# Patient Record
Sex: Female | Born: 1965 | Race: Black or African American | Hispanic: No | Marital: Married | Smoking: Never smoker
Health system: Southern US, Community
[De-identification: ages and names within clinical notes are randomized; demographics above are authoritative.]

## PROBLEM LIST (undated history)

## (undated) DIAGNOSIS — I10 Essential (primary) hypertension: Secondary | ICD-10-CM

## (undated) DIAGNOSIS — E119 Type 2 diabetes mellitus without complications: Secondary | ICD-10-CM

## (undated) DIAGNOSIS — K5900 Constipation, unspecified: Secondary | ICD-10-CM

## (undated) DIAGNOSIS — M7989 Other specified soft tissue disorders: Secondary | ICD-10-CM

## (undated) DIAGNOSIS — R0602 Shortness of breath: Secondary | ICD-10-CM

## (undated) DIAGNOSIS — I2510 Atherosclerotic heart disease of native coronary artery without angina pectoris: Secondary | ICD-10-CM

## (undated) DIAGNOSIS — I519 Heart disease, unspecified: Secondary | ICD-10-CM

## (undated) DIAGNOSIS — E669 Obesity, unspecified: Secondary | ICD-10-CM

## (undated) DIAGNOSIS — E785 Hyperlipidemia, unspecified: Secondary | ICD-10-CM

## (undated) DIAGNOSIS — I252 Old myocardial infarction: Secondary | ICD-10-CM

## (undated) DIAGNOSIS — G479 Sleep disorder, unspecified: Secondary | ICD-10-CM

## (undated) DIAGNOSIS — F419 Anxiety disorder, unspecified: Secondary | ICD-10-CM

## (undated) DIAGNOSIS — E559 Vitamin D deficiency, unspecified: Secondary | ICD-10-CM

## (undated) HISTORY — DX: Shortness of breath: R06.02

## (undated) HISTORY — PX: CARDIAC CATHETERIZATION: SHX172

## (undated) HISTORY — DX: Other specified soft tissue disorders: M79.89

## (undated) HISTORY — DX: Anxiety disorder, unspecified: F41.9

## (undated) HISTORY — DX: Hyperlipidemia, unspecified: E78.5

## (undated) HISTORY — DX: Vitamin D deficiency, unspecified: E55.9

## (undated) HISTORY — DX: Old myocardial infarction: I25.2

## (undated) HISTORY — DX: Obesity, unspecified: E66.9

## (undated) HISTORY — DX: Heart disease, unspecified: I51.9

## (undated) HISTORY — DX: Constipation, unspecified: K59.00

## (undated) HISTORY — DX: Sleep disorder, unspecified: G47.9

## (undated) HISTORY — DX: Essential (primary) hypertension: I10

## (undated) HISTORY — DX: Type 2 diabetes mellitus without complications: E11.9

---

## 1998-10-18 ENCOUNTER — Ambulatory Visit (HOSPITAL_COMMUNITY): Admission: RE | Admit: 1998-10-18 | Discharge: 1998-10-18 | Payer: Self-pay | Admitting: Obstetrics and Gynecology

## 1998-11-07 ENCOUNTER — Inpatient Hospital Stay (HOSPITAL_COMMUNITY): Admission: AD | Admit: 1998-11-07 | Discharge: 1998-11-07 | Payer: Self-pay | Admitting: Obstetrics and Gynecology

## 1998-11-19 ENCOUNTER — Inpatient Hospital Stay (HOSPITAL_COMMUNITY): Admission: AD | Admit: 1998-11-19 | Discharge: 1998-11-19 | Payer: Self-pay | Admitting: Obstetrics and Gynecology

## 1998-11-20 ENCOUNTER — Encounter: Payer: Self-pay | Admitting: Obstetrics and Gynecology

## 1998-11-20 ENCOUNTER — Inpatient Hospital Stay (HOSPITAL_COMMUNITY): Admission: AD | Admit: 1998-11-20 | Discharge: 1998-11-20 | Payer: Self-pay | Admitting: Obstetrics and Gynecology

## 1998-12-13 ENCOUNTER — Inpatient Hospital Stay (HOSPITAL_COMMUNITY): Admission: AD | Admit: 1998-12-13 | Discharge: 1998-12-17 | Payer: Self-pay | Admitting: Obstetrics and Gynecology

## 1999-02-05 ENCOUNTER — Other Ambulatory Visit: Admission: RE | Admit: 1999-02-05 | Discharge: 1999-02-05 | Payer: Self-pay | Admitting: *Deleted

## 2000-10-07 ENCOUNTER — Other Ambulatory Visit: Admission: RE | Admit: 2000-10-07 | Discharge: 2000-10-07 | Payer: Self-pay | Admitting: Gynecology

## 2001-11-03 ENCOUNTER — Encounter: Payer: Self-pay | Admitting: Internal Medicine

## 2001-11-03 ENCOUNTER — Encounter: Admission: RE | Admit: 2001-11-03 | Discharge: 2001-11-03 | Payer: Self-pay | Admitting: Internal Medicine

## 2002-08-10 ENCOUNTER — Other Ambulatory Visit: Admission: RE | Admit: 2002-08-10 | Discharge: 2002-08-10 | Payer: Self-pay | Admitting: Gynecology

## 2003-01-05 ENCOUNTER — Encounter: Payer: Self-pay | Admitting: Internal Medicine

## 2003-01-05 ENCOUNTER — Encounter: Admission: RE | Admit: 2003-01-05 | Discharge: 2003-01-05 | Payer: Self-pay | Admitting: Internal Medicine

## 2003-10-20 HISTORY — PX: PARTIAL HYSTERECTOMY: SHX80

## 2004-04-02 ENCOUNTER — Other Ambulatory Visit: Admission: RE | Admit: 2004-04-02 | Discharge: 2004-04-02 | Payer: Self-pay | Admitting: Gynecology

## 2004-05-14 ENCOUNTER — Encounter (INDEPENDENT_AMBULATORY_CARE_PROVIDER_SITE_OTHER): Payer: Self-pay | Admitting: Specialist

## 2004-05-15 ENCOUNTER — Inpatient Hospital Stay (HOSPITAL_COMMUNITY): Admission: RE | Admit: 2004-05-15 | Discharge: 2004-05-16 | Payer: Self-pay | Admitting: Gynecology

## 2006-10-29 ENCOUNTER — Other Ambulatory Visit: Admission: RE | Admit: 2006-10-29 | Discharge: 2006-10-29 | Payer: Self-pay | Admitting: Internal Medicine

## 2006-11-19 ENCOUNTER — Encounter: Admission: RE | Admit: 2006-11-19 | Discharge: 2006-11-19 | Payer: Self-pay | Admitting: Internal Medicine

## 2010-02-15 ENCOUNTER — Encounter: Admission: RE | Admit: 2010-02-15 | Discharge: 2010-02-15 | Payer: Self-pay | Admitting: Internal Medicine

## 2010-11-09 ENCOUNTER — Encounter: Payer: Self-pay | Admitting: Internal Medicine

## 2011-01-22 ENCOUNTER — Other Ambulatory Visit: Payer: Self-pay | Admitting: Gynecology

## 2011-02-23 ENCOUNTER — Other Ambulatory Visit: Payer: Self-pay | Admitting: Internal Medicine

## 2011-02-23 DIAGNOSIS — R209 Unspecified disturbances of skin sensation: Secondary | ICD-10-CM

## 2011-02-25 ENCOUNTER — Ambulatory Visit
Admission: RE | Admit: 2011-02-25 | Discharge: 2011-02-25 | Disposition: A | Discharge status: Home / Self Care | Payer: Managed Care, Other (non HMO) | Source: Ambulatory Visit | Attending: Internal Medicine | Admitting: Internal Medicine

## 2011-02-25 DIAGNOSIS — R209 Unspecified disturbances of skin sensation: Secondary | ICD-10-CM

## 2011-02-25 MED ORDER — GADOBENATE DIMEGLUMINE 529 MG/ML IV SOLN
17.0000 mL | Freq: Once | INTRAVENOUS | Status: AC | PRN
  Administered 2011-02-25: 17 mL via INTRAVENOUS

## 2011-03-06 NOTE — Op Note (Signed)
NAME:  [name] [name], [name] [name]                         ACCOUNT NO.:  [id number]   MEDICAL RECORD NO.:  [id number]                   PATIENT TYPE:  AMB   LOCATION:  DAY                                  FACILITY:  [facility]   PHYSICIAN:  Gretta Cool, M.D.              DATE OF BIRTH:  07-22-1966   DATE OF PROCEDURE:  05/14/2004  DATE OF DISCHARGE:                                 OPERATIVE REPORT   PREOPERATIVE DIAGNOSES:  Abnormal uterine bleeding, dysmenorrhea and anemia  secondary to uterine fibroids recurrent post myomectomy.   POSTOPERATIVE DIAGNOSES:  Abnormal uterine bleeding, dysmenorrhea and anemia  secondary to uterine fibroids recurrent post myomectomy.   PROCEDURE:  Supracervical hysterectomy, lysis of adhesions.   SURGEON:  Gretta Cool, M.D.   ASSISTANT:  Raynald Kemp, M.D.   ANESTHESIA:  General oral tracheal.   DESCRIPTION OF PROCEDURE:  Under excellent general anesthesia with the  patient prepped and draped in supine position with the Foley catheter  draining her bladder, a Pfannenstiel incision was made and extended through  the fascia. The peritoneum was then opened and the abdomen explored. There  were no abnormalities identified in the upper abdomen. There was adhesion  particularly of the anterior uterus to the anterior pelvic wall and  extensive adhesions about the bladder flap.  At this point, these adhesions  were lysed, some omental adhesions to the anterior abdominal wall were also  lysed and the uterus mobilized. The round ligaments were then transected,  anterior leaf of the broad ligament was opened and pushed off the lower  segment.  The ovarian ligaments were then clamped, cut, sutured and doubly  ligated with #0 Vicryl.  The uterine vessels then skeletonized, clamped,  cut, sutured and tied with #0 Vicryl. At this point, the cervix was incised  in __________ fashion so as to remove as much as possible of the  endocervical canal. The remainder of the  canal was treated by cautery.  At  this point, the cervix was closed with a running mattress suture of #0  Vicryl. At this point, all the pedicles were dry, pelvis was irrigated with  lactated Ringer's. The pelvic peritoneum was then closed with running suture  of 2-0 Monocryl.  At this point, the pelvis was once again irrigated, the  packs and retractors removed, the abdominoperitoneum was closed with running  suture of #0 Monocryl. The rectus muscles were plicated in the midline with  running suture of #0 Monocryl, the fascia was approximated with running  suture of #0 Vicryl from each angle to midline. The subcutaneous tissue was  approximated with interrupted sutures of 3-0 Vicryl, skin was closed with  skin staples and Steri-Strips.  At the end of the procedure, sponge and lap  counts were correct with no complications. The patient returned to the  recovery room in excellent condition.  Gretta Cool, M.D.   CWL/MEDQ  D:  05/14/2004  T:  05/14/2004  Job:  [id number]   cc:   Dr. Theda Sers

## 2011-03-06 NOTE — H&P (Signed)
NAME:  [name] [name], [name] [name]                         ACCOUNT NO.:  [id number]   MEDICAL RECORD NO.:  [id number]                   PATIENT TYPE:  AMB   LOCATION:  DAY                                  FACILITY:  [facility]   PHYSICIAN:  Gretta Cool, M.D.              DATE OF BIRTH:  11-Oct-1966   DATE OF ADMISSION:  DATE OF DISCHARGE:                                HISTORY & PHYSICAL   CHIEF COMPLAINT:  Abnormal uterine bleeding, dysmenorrhea and increased in  abdominal girth.   HISTORY OF PRESENT ILLNESS:  A 45 year old gravida 1, para 1, with a history  of a myomectomy in 1996.  She had fibroids that together weighed 1575 grams,  largest measuring 15 cm.  She subsequently had a successful pregnancy and  was delivered by a cesarean section delivery.  She now has a recurrence of  uterine leiomyomata and abnormal uterine bleeding and she is admitted for  definitive therapy by supracervical hysterectomy.  She understands the  risk/benefit ratio, all alternative therapies.  She has used no  contraception and has failed to conceive.  She is on continuous iron therapy  because of frequent and heavy and prolonged cycles.   She also has hypertension on multiple medications.  Primary care doctor, Dr.  Theda Sers.   PAST MEDICAL HISTORY:  Usual childhood disease without sequelae.  Medical  illnesses:  Hypertension on therapy as above.  Denies other significant  medical history.  Previous surgery:  Myomectomy 1996, cesarean section  delivery 2000.   FAMILY HISTORY:  Maternal grandmother has hypercholesterolemia, history of  stroke.   SOCIAL HISTORY:  The patient works for [employer].  Husband is  employed at [employer].  They have one child.   REVIEW OF SYSTEMS:  HEENT:  Denies symptoms.  CARDIORESPIRATORY:  Denies  asthma, cough, bronchitis, shortness of breath.  GI/GU:  Denies frequency,  urgency, dysuria, change in bowel habits, food intolerance.   PHYSICAL EXAMINATION:  GENERAL:   Well-developed, well-nourished, short  __________ female, massively over ideal weight.  VITAL SIGNS:  Blood pressure 138/98, pulse 80, respirations 20.  HEENT:  Pupils equally react to light and accommodate.  Fundi not examined.  Oropharynx clear.  CHEST:  Clear to percussion and auscultation.  HEART:  Regular rhythm without murmur, cardiac enlargement.  BREASTS:  ABDOMEN:  Soft with a large panniculus.  No significant organomegaly is  palpable.  RECTAL/PELVIC:  External genitalia normal female, vagina clean, rugous  cervix is nulliparous clean.  Uterus is irregular and enlarged, difficult to  outline because of her abdominal panniculus.  Adnexa nonpalpable.  Rectovaginal confirms.   IMPRESSION:  1. Uterine leiomyomata recurrent with abnormal uterine bleeding.  2. History of myomectomy 1996 with delivery of fibroids weighing greater     than 1500 grams.  3. History of cesarean section delivery   PLAN:  On to supracervical hysterectomy, ovarian conservation if possible.  Risk/benefit ratio discussed  in detail.  Alternative therapies __________.                                               Gretta Cool, M.D.    CWL/MEDQ  D:  05/13/2004  T:  05/13/2004  Job:  [id number]   cc:   Theda Sers, MD  [facility]

## 2014-01-09 ENCOUNTER — Encounter: Payer: 59 | Attending: Internal Medicine

## 2014-01-09 VITALS — Ht 60.0 in | Wt 188.4 lb

## 2014-01-09 DIAGNOSIS — Z713 Dietary counseling and surveillance: Secondary | ICD-10-CM | POA: Insufficient documentation

## 2014-01-09 DIAGNOSIS — E119 Type 2 diabetes mellitus without complications: Secondary | ICD-10-CM | POA: Insufficient documentation

## 2014-01-10 NOTE — Progress Notes (Signed)
Patient was seen on 01/09/14 for the first of a series of three diabetes self-management courses at the [facility].  Current HbA1c: 8.9%  The following learning objectives were met by the patient during this class:  Describe diabetes  State some common risk factors for diabetes  Defines the role of glucose and insulin  Identifies type of diabetes and pathophysiology  Describe the relationship between diabetes and cardiovascular risk  State the members of the Healthcare Team  States the rationale for glucose monitoring  State when to test glucose  State their individual Target Range  State the importance of logging glucose readings  Describe how to interpret glucose readings  Identifies A1C target  Explain the correlation between A1c and eAG values  State symptoms and treatment of high blood glucose  State symptoms and treatment of low blood glucose  Explain proper technique for glucose testing  Identifies proper sharps disposal  Handouts given during class include:  Living Well with Diabetes book  Carb Counting and Meal Planning book  Meal Plan Card  Carbohydrate guide  Meal planning worksheet  Low Sodium Flavoring Tips  The diabetes portion plate  C6C to eAG Conversion Chart  Diabetes Medications  Diabetes Recommended Care Schedule  Support Group  Diabetes Success Plan  Core Class Satisfaction Survey  Follow-Up Plan:  Attend core 2

## 2014-01-16 DIAGNOSIS — E119 Type 2 diabetes mellitus without complications: Secondary | ICD-10-CM

## 2014-01-17 NOTE — Progress Notes (Signed)

## 2014-01-23 ENCOUNTER — Encounter: Payer: 59 | Attending: Internal Medicine

## 2014-01-23 DIAGNOSIS — Z713 Dietary counseling and surveillance: Secondary | ICD-10-CM | POA: Insufficient documentation

## 2014-01-23 DIAGNOSIS — E119 Type 2 diabetes mellitus without complications: Secondary | ICD-10-CM

## 2014-01-23 NOTE — Progress Notes (Signed)
Patient was seen on 01/23/14 for the third of a series of three diabetes self-management courses at the [facility]. The following learning objectives were met by the patient during this class:    State the amount of activity recommended for healthy living   Describe activities suitable for individual needs   Identify ways to regularly incorporate activity into daily life   Identify barriers to activity and ways to over come these barriers  Identify diabetes medications being personally used and their primary action for lowering glucose and possible side effects   Describe role of stress on blood glucose and develop strategies to address psychosocial issues   Identify diabetes complications and ways to prevent them  Explain how to manage diabetes during illness   Evaluate success in meeting personal goal   Establish 2-3 goals that they will plan to diligently work on until they return for the  56-monthfollow-up visit  Goals:  Follow Diabetes Meal Plan as instructed  Aim for 15-30 mins of physical activity daily as tolerated  Bring food record and glucose log to your follow up visit  Your patient has established the following 4 month goals in their individualized success plan: I will count my carb choices at most meals and snacks I will increase my activity level at least 3 days a week I will take my diabetes medications as scheduled  Your patient has identified these potential barriers to change:  None stated  Your patient has identified their diabetes self-care support plan as  [facility]  Join the gym  Enroll in a dance class  Plan:  Attend Core 4 in 4 months

## 2014-05-21 ENCOUNTER — Ambulatory Visit: Payer: Managed Care, Other (non HMO)

## 2016-01-29 ENCOUNTER — Encounter (HOSPITAL_COMMUNITY): Payer: Self-pay | Admitting: Emergency Medicine

## 2016-01-29 ENCOUNTER — Ambulatory Visit (INDEPENDENT_AMBULATORY_CARE_PROVIDER_SITE_OTHER)
Admission: EM | Admit: 2016-01-29 | Discharge: 2016-01-29 | Disposition: A | Discharge status: Nursing Home (Includes ICF) | Payer: 59 | Source: Home / Self Care | Attending: Family Medicine | Admitting: Family Medicine

## 2016-01-29 ENCOUNTER — Encounter (HOSPITAL_COMMUNITY): Payer: Self-pay | Admitting: *Deleted

## 2016-01-29 ENCOUNTER — Emergency Department (HOSPITAL_COMMUNITY)
Admission: EM | Admit: 2016-01-29 | Discharge: 2016-01-30 | Disposition: A | Discharge status: Home / Self Care | Payer: 59 | Attending: Emergency Medicine | Admitting: Emergency Medicine

## 2016-01-29 DIAGNOSIS — E669 Obesity, unspecified: Secondary | ICD-10-CM | POA: Insufficient documentation

## 2016-01-29 DIAGNOSIS — Z79899 Other long term (current) drug therapy: Secondary | ICD-10-CM | POA: Diagnosis not present

## 2016-01-29 DIAGNOSIS — E1165 Type 2 diabetes mellitus with hyperglycemia: Secondary | ICD-10-CM

## 2016-01-29 DIAGNOSIS — R350 Frequency of micturition: Secondary | ICD-10-CM

## 2016-01-29 DIAGNOSIS — R109 Unspecified abdominal pain: Secondary | ICD-10-CM | POA: Diagnosis not present

## 2016-01-29 DIAGNOSIS — R358 Other polyuria: Secondary | ICD-10-CM | POA: Diagnosis not present

## 2016-01-29 DIAGNOSIS — Z3202 Encounter for pregnancy test, result negative: Secondary | ICD-10-CM | POA: Diagnosis not present

## 2016-01-29 DIAGNOSIS — I10 Essential (primary) hypertension: Secondary | ICD-10-CM | POA: Diagnosis not present

## 2016-01-29 DIAGNOSIS — Z7984 Long term (current) use of oral hypoglycemic drugs: Secondary | ICD-10-CM | POA: Diagnosis not present

## 2016-01-29 DIAGNOSIS — Z880 Allergy status to penicillin: Secondary | ICD-10-CM | POA: Diagnosis not present

## 2016-01-29 DIAGNOSIS — R739 Hyperglycemia, unspecified: Secondary | ICD-10-CM

## 2016-01-29 DIAGNOSIS — E785 Hyperlipidemia, unspecified: Secondary | ICD-10-CM | POA: Diagnosis not present

## 2016-01-29 DIAGNOSIS — R3589 Other polyuria: Secondary | ICD-10-CM

## 2016-01-29 LAB — BASIC METABOLIC PANEL
Anion gap: 17 — ABNORMAL HIGH (ref 5–15)
BUN: 16 mg/dL (ref 6–20)
CALCIUM: 11 mg/dL — AB (ref 8.9–10.3)
CO2: 23 mmol/L (ref 22–32)
CREATININE: 0.9 mg/dL (ref 0.44–1.00)
Chloride: 95 mmol/L — ABNORMAL LOW (ref 101–111)
GFR calc non Af Amer: >60 mL/min (ref >60)
GLUCOSE: 556 mg/dL — AB (ref 65–99)
Potassium: 4.1 mmol/L (ref 3.5–5.1)
Sodium: 135 mmol/L (ref 135–145)

## 2016-01-29 LAB — CBC
HCT: 44.6 % (ref 36.0–46.0)
Hemoglobin: 14.6 g/dL (ref 12.0–15.0)
MCH: 25.5 pg — AB (ref 26.0–34.0)
MCHC: 32.7 g/dL (ref 30.0–36.0)
MCV: 78 fL (ref 78.0–100.0)
PLATELETS: 290 10*3/uL (ref 150–400)
RBC: 5.72 MIL/uL — ABNORMAL HIGH (ref 3.87–5.11)
RDW: 13.5 % (ref 11.5–15.5)
WBC: 9.4 10*3/uL (ref 4.0–10.5)

## 2016-01-29 LAB — POCT URINALYSIS DIP (DEVICE)
BILIRUBIN URINE: NEGATIVE
Glucose, UA: >=1000 mg/dL — AB
Hgb urine dipstick: NEGATIVE
Ketones, ur: 15 mg/dL — AB
Leukocytes, UA: NEGATIVE
NITRITE: NEGATIVE
PH: 5.5 (ref 5.0–8.0)
Protein, ur: NEGATIVE mg/dL
Specific Gravity, Urine: <=1.005 (ref 1.005–1.030)
Urobilinogen, UA: 0.2 mg/dL (ref 0.0–1.0)

## 2016-01-29 LAB — URINALYSIS, ROUTINE W REFLEX MICROSCOPIC
BILIRUBIN URINE: NEGATIVE
Glucose, UA: >1000 mg/dL — AB
HGB URINE DIPSTICK: NEGATIVE
Ketones, ur: 15 mg/dL — AB
Leukocytes, UA: NEGATIVE
Nitrite: NEGATIVE
PROTEIN: NEGATIVE mg/dL
Specific Gravity, Urine: 1.035 — ABNORMAL HIGH (ref 1.005–1.030)
pH: 5 (ref 5.0–8.0)

## 2016-01-29 LAB — URINE MICROSCOPIC-ADD ON
RBC / HPF: NONE SEEN RBC/hpf (ref 0–5)
WBC, UA: NONE SEEN WBC/hpf (ref 0–5)

## 2016-01-29 LAB — CBG MONITORING, ED
GLUCOSE-CAPILLARY: 514 mg/dL — AB (ref 65–99)
Glucose-Capillary: 504 mg/dL — ABNORMAL HIGH (ref 65–99)

## 2016-01-29 LAB — GLUCOSE, CAPILLARY: GLUCOSE-CAPILLARY: 524 mg/dL — AB (ref 65–99)

## 2016-01-29 LAB — POC URINE PREG, ED: PREG TEST UR: NEGATIVE

## 2016-01-29 MED ORDER — SODIUM CHLORIDE 0.9 % IV BOLUS (SEPSIS)
1000.0000 mL | Freq: Once | INTRAVENOUS | Status: AC
  Administered 2016-01-29: 1000 mL via INTRAVENOUS

## 2016-01-29 NOTE — ED Provider Notes (Signed)
CSN: [id number]     Arrival date & time 01/29/16  1833 History   First MD Initiated Contact with Patient 01/29/16 2010     Chief Complaint  Patient presents with  . Urinary Tract Infection   (Consider location/radiation/quality/duration/timing/severity/associated sxs/prior Treatment) HPI Comments: 50 year old female with type 2 diabetes mellitus treated with metformin only presents with what she believes to be a tract infection. She is having urinary frequency, dysuria and urgency. Denies fever, GI symptoms. She states that she had run out of strips and has not been checking her blood sugar. She has not been following up with her PCP for diabetes management.    Past Medical History  Diagnosis Date  . Diabetes mellitus without complication (HCC)   . Hyperlipidemia   . Hypertension   . Obesity    Past Surgical History  Procedure Laterality Date  . Cesarean section     Family History  Problem Relation Age of Onset  . Hypertension Other    Social History  Substance Use Topics  . Smoking status: Never Smoker   . Smokeless tobacco: None  . Alcohol Use: Yes   OB History    No data available     Review of Systems  Constitutional: Negative for fever, chills and activity change.  HENT: Negative.   Respiratory: Negative.   Cardiovascular: Negative.   Endocrine: Positive for polydipsia and polyuria.  Genitourinary: Positive for dysuria, urgency and frequency. Negative for hematuria and pelvic pain.    Allergies  Penicillins  Home Medications   Prior to Admission medications   Medication Sig Start Date End Date Taking? Authorizing Provider  amLODipine (NORVASC) 10 MG tablet Take 10 mg by mouth daily.   Yes Historical Provider, MD  aspirin 81 MG tablet Take 81 mg by mouth daily.   Yes Historical Provider, MD  metFORMIN (GLUCOPHAGE) 500 MG tablet Take 1,000 mg by mouth 2 (two) times daily with a meal.   Yes Historical Provider, MD  simvastatin (ZOCOR) 20 MG tablet Take 20 mg  by mouth at bedtime.   Yes Historical Provider, MD  valsartan (DIOVAN) 160 MG tablet Take 160 mg by mouth daily.   Yes Historical Provider, MD   Meds Ordered and Administered this Visit  Medications - No data to display  BP 130/88 mmHg  Pulse 110  Temp(Src) 98 F (36.7 C) (Oral)  Resp 16  SpO2 96% No data found.   Physical Exam  Constitutional: She is oriented to person, place, and time. She appears well-developed and well-nourished. No distress.  Neck: Normal range of motion. Neck supple.  Cardiovascular: Regular rhythm.   Pulmonary/Chest: Effort normal and breath sounds normal.  Musculoskeletal: She exhibits no edema.  Neurological: She is alert and oriented to person, place, and time. She exhibits normal muscle tone.  Skin: Skin is warm and dry.  Nursing note and vitals reviewed.   ED Course  Procedures (including critical care time)  Labs Review Labs Reviewed  GLUCOSE, CAPILLARY - Abnormal; Notable for the following:    Glucose-Capillary 524 (*)    All other components within normal limits  POCT URINALYSIS DIP (DEVICE) - Abnormal; Notable for the following:    Glucose, UA >=1000 (*)    Ketones, ur 15 (*)    All other components within normal limits    Imaging Review No results found.   Visual Acuity Review  Right Eye Distance:   Left Eye Distance:   Bilateral Distance:    Right Eye Near:   Left Eye  Near:    Bilateral Near:         MDM   1. Type 2 diabetes mellitus with hyperglycemia, without long-term current use of insulin (HCC)   2. Frequency of urination and polyuria    50 year old female being transferred to the emergency department for correction of hyperglycemia, CBG 524 in association with thirst, polyuria, malaise and weakness.    Hayden Rasmussenavid Tabb Croghan, NP 01/29/16 2059

## 2016-01-29 NOTE — ED Provider Notes (Signed)
CSN: [id number]     Arrival date & time 01/29/16  2115 History  By signing my name below, I, Bethel BornBritney McCollum, attest that this documentation has been prepared under the direction and in the presence of Rolan BuccoMelanie Dontario Evetts, MD. Electronically Signed: Bethel BornBritney McCollum, ED Scribe. 01/29/2016. 11:16 PM    Chief Complaint  Patient presents with  . Hyperglycemia   The history is provided by the patient. No language interpreter was used.   [name] [name] is a 50 y.o. female with history of DM who presents to the Emergency Department complaining of hyperglycemia. Over the last 3 weeks she has felt ill and went to Urgent Care today where her blood sugar was over 500. Associated symptoms include fatigue, polydipsia, polyuria, dysuria, and abdominal pain. Pt denies fever, nausea, vomiting, cough. She states that her blood sugar is typically well controlled but she went on vacation over 2 weeks ago and did not adhere to a diabetic diet.   Past Medical History  Diagnosis Date  . Diabetes mellitus without complication (HCC)   . Hyperlipidemia   . Hypertension   . Obesity    Past Surgical History  Procedure Laterality Date  . Cesarean section     Family History  Problem Relation Age of Onset  . Hypertension Other    Social History  Substance Use Topics  . Smoking status: Never Smoker   . Smokeless tobacco: None  . Alcohol Use: Yes   OB History    No data available     Review of Systems  Constitutional: Positive for fatigue. Negative for fever.  Respiratory: Negative for cough.   Cardiovascular: Negative for chest pain.  Gastrointestinal: Positive for abdominal pain. Negative for nausea and vomiting.  Endocrine: Positive for polydipsia and polyuria.  Genitourinary: Positive for dysuria.  All other systems reviewed and are negative.   Allergies  Penicillins  Home Medications   Prior to Admission medications   Medication Sig Start Date End Date Taking? Authorizing Provider  amLODipine  (NORVASC) 10 MG tablet Take 10 mg by mouth daily.   Yes Historical Provider, MD  metFORMIN (GLUCOPHAGE) 500 MG tablet Take 1,000 mg by mouth every evening.    Yes Historical Provider, MD  Multiple Vitamin (MULTIVITAMIN WITH MINERALS) TABS tablet Take 1 tablet by mouth daily.   Yes Historical Provider, MD  Polyethyl Glycol-Propyl Glycol (SYSTANE OP) Apply 1-2 drops to eye daily as needed (dry eyes).   Yes Historical Provider, MD  simvastatin (ZOCOR) 20 MG tablet Take 20 mg by mouth at bedtime.   Yes Historical Provider, MD  valsartan (DIOVAN) 160 MG tablet Take 160 mg by mouth daily.   Yes Historical Provider, MD   BP 142/106 mmHg  Pulse 118  Temp(Src) 98.1 F (36.7 C) (Oral)  Resp 20  SpO2 100% Physical Exam  Constitutional: She is oriented to person, place, and time. She appears well-developed and well-nourished. No distress.  HENT:  Head: Normocephalic and atraumatic.  Eyes: EOM are normal.  Neck: Normal range of motion.  Cardiovascular: Normal rate, regular rhythm and normal heart sounds.   Pulmonary/Chest: Effort normal and breath sounds normal.  Abdominal: Soft. She exhibits no distension. There is no tenderness.  Musculoskeletal: Normal range of motion.  Neurological: She is alert and oriented to person, place, and time.  Skin: Skin is warm and dry.  Psychiatric: She has a normal mood and affect. Judgment normal.  Nursing note and vitals reviewed.   ED Course  Procedures (including critical care time) DIAGNOSTIC STUDIES:  Oxygen Saturation is 100% on RA,  normal by my interpretation.    COORDINATION OF CARE: 11:13 PM Discussed treatment plan which includes lab work and IVF with pt at bedside and pt agreed to plan.  Labs Review Labs Reviewed  BASIC METABOLIC PANEL - Abnormal; Notable for the following:    Chloride 95 (*)    Glucose, Bld 556 (*)    Calcium 11.0 (*)    Anion gap 17 (*)    All other components within normal limits  CBC - Abnormal; Notable for the  following:    RBC 5.72 (*)    MCH 25.5 (*)    All other components within normal limits  URINALYSIS, ROUTINE W REFLEX MICROSCOPIC (NOT AT [facility]) - Abnormal; Notable for the following:    Specific Gravity, Urine 1.035 (*)    Glucose, UA >1000 (*)    Ketones, ur 15 (*)    All other components within normal limits  URINE MICROSCOPIC-ADD ON - Abnormal; Notable for the following:    Squamous Epithelial / LPF 0-5 (*)    Bacteria, UA RARE (*)    All other components within normal limits  BASIC METABOLIC PANEL - Abnormal; Notable for the following:    Glucose, Bld 383 (*)    All other components within normal limits  CBG MONITORING, ED - Abnormal; Notable for the following:    Glucose-Capillary 504 (*)    All other components within normal limits  CBG MONITORING, ED - Abnormal; Notable for the following:    Glucose-Capillary 514 (*)    All other components within normal limits  CBG MONITORING, ED - Abnormal; Notable for the following:    Glucose-Capillary 417 (*)    All other components within normal limits  POC URINE PREG, ED    Imaging Review No results found. I have personally reviewed and evaluated these lab results as part of my medical decision-making.   EKG Interpretation None      MDM   Final diagnoses:  Hyperglycemia    Patient presents with hyperglycemia. She has a history of diabetes. She states she recently went to [facility] and was not eating very well and was drinking alcohol.  Her blood sugar has improved after IV fluids. She has no signs of DKA. She is well-appearing. Her last blood sugar was 383. She was discharged home in good condition. She was encouraged to have close follow-up with her PCP. She was encouraged to keep a close eye on her blood sugars at home. Return precautions were given.  I personally performed the services described in this documentation, which was scribed in my presence.  The recorded information has been reviewed and  considered.   Rolan Bucco, MD 01/30/16 [id number]

## 2016-01-29 NOTE — ED Notes (Signed)
CBG 504 

## 2016-01-29 NOTE — ED Notes (Signed)
CBG 514, RN notified.

## 2016-01-29 NOTE — ED Notes (Signed)
Pt states that she  Went to urgent care because she has been really thirsty and  Sluggish. States urgent care sent her here with high glucose levels. Pt reports she is a type 2 diabetic. States she takes Metformin but has not been taking it since before March.

## 2016-01-29 NOTE — ED Notes (Addendum)
Sting at the end of urinary stream.  Symptoms started the last week in march.  Patient complains of urgency and frequency.  Patient is thirsty.

## 2016-01-30 LAB — BASIC METABOLIC PANEL
ANION GAP: 12 (ref 5–15)
BUN: 14 mg/dL (ref 6–20)
CALCIUM: 9.1 mg/dL (ref 8.9–10.3)
CO2: 24 mmol/L (ref 22–32)
CREATININE: 0.71 mg/dL (ref 0.44–1.00)
Chloride: 105 mmol/L (ref 101–111)
GFR calc Af Amer: >60 mL/min (ref >60)
GLUCOSE: 383 mg/dL — AB (ref 65–99)
POTASSIUM: 4.3 mmol/L (ref 3.5–5.1)
Sodium: 141 mmol/L (ref 135–145)

## 2016-01-30 LAB — CBG MONITORING, ED: GLUCOSE-CAPILLARY: 417 mg/dL — AB (ref 65–99)

## 2016-01-30 MED ORDER — SODIUM CHLORIDE 0.9 % IV BOLUS (SEPSIS)
1000.0000 mL | Freq: Once | INTRAVENOUS | Status: AC
  Administered 2016-01-30: 1000 mL via INTRAVENOUS

## 2016-01-30 NOTE — ED Notes (Signed)
CBG was 417, RN notified

## 2016-01-30 NOTE — Discharge Instructions (Signed)
Hyperglycemia °Hyperglycemia occurs when the glucose (sugar) in your blood is too high. Hyperglycemia can happen for many reasons, but it most often happens to people who do not know they have diabetes or are not managing their diabetes properly.  °CAUSES  °Whether you have diabetes or not, there are other causes of hyperglycemia. Hyperglycemia can occur when you have diabetes, but it can also occur in other situations that you might not be as aware of, such as: °Diabetes °· If you have diabetes and are having problems controlling your blood glucose, hyperglycemia could occur because of some of the following reasons: °¨ Not following your meal plan. °¨ Not taking your diabetes medications or not taking it properly. °¨ Exercising less or doing less activity than you normally do. °¨ Being sick. °Pre-diabetes °· This cannot be ignored. Before people develop Type 2 diabetes, they almost always have "pre-diabetes." This is when your blood glucose levels are higher than normal, but not yet high enough to be diagnosed as diabetes. Research has shown that some long-term damage to the body, especially the heart and circulatory system, may already be occurring during pre-diabetes. If you take action to manage your blood glucose when you have pre-diabetes, you may delay or prevent Type 2 diabetes from developing. °Stress °· If you have diabetes, you may be "diet" controlled or on oral medications or insulin to control your diabetes. However, you may find that your blood glucose is higher than usual in the hospital whether you have diabetes or not. This is often referred to as "stress hyperglycemia." Stress can elevate your blood glucose. This happens because of hormones put out by the body during times of stress. If stress has been the cause of your high blood glucose, it can be followed regularly by your caregiver. That way he/she can make sure your hyperglycemia does not continue to get worse or progress to  diabetes. °Steroids °· Steroids are medications that act on the infection fighting system (immune system) to block inflammation or infection. One side effect can be a rise in blood glucose. Most people can produce enough extra insulin to allow for this rise, but for those who cannot, steroids make blood glucose levels go even higher. It is not unusual for steroid treatments to "uncover" diabetes that is developing. It is not always possible to determine if the hyperglycemia will go away after the steroids are stopped. A special blood test called an A1c is sometimes done to determine if your blood glucose was elevated before the steroids were started. °SYMPTOMS °· Thirsty. °· Frequent urination. °· Dry mouth. °· Blurred vision. °· Tired or fatigue. °· Weakness. °· Sleepy. °· Tingling in feet or leg. °DIAGNOSIS  °Diagnosis is made by monitoring blood glucose in one or all of the following ways: °· A1c test. This is a chemical found in your blood. °· Fingerstick blood glucose monitoring. °· Laboratory results. °TREATMENT  °First, knowing the cause of the hyperglycemia is important before the hyperglycemia can be treated. Treatment may include, but is not be limited to: °· Education. °· Change or adjustment in medications. °· Change or adjustment in meal plan. °· Treatment for an illness, infection, etc. °· More frequent blood glucose monitoring. °· Change in exercise plan. °· Decreasing or stopping steroids. °· Lifestyle changes. °HOME CARE INSTRUCTIONS  °· Test your blood glucose as directed. °· Exercise regularly. Your caregiver will give you instructions about exercise. Pre-diabetes or diabetes which comes on with stress is helped by exercising. °· Eat wholesome,   balanced meals. Eat often and at regular, fixed times. Your caregiver or nutritionist will give you a meal plan to guide your sugar intake. °· Being at an ideal weight is important. If needed, losing as little as 10 to 15 pounds may help improve blood  glucose levels. °SEEK MEDICAL CARE IF:  °· You have questions about medicine, activity, or diet. °· You continue to have symptoms (problems such as increased thirst, urination, or weight gain). °SEEK IMMEDIATE MEDICAL CARE IF:  °· You are vomiting or have diarrhea. °· Your breath smells fruity. °· You are breathing faster or slower. °· You are very sleepy or incoherent. °· You have numbness, tingling, or pain in your feet or hands. °· You have chest pain. °· Your symptoms get worse even though you have been following your caregiver's orders. °· If you have any other questions or concerns. °  °This information is not intended to replace advice given to you by your health care provider. Make sure you discuss any questions you have with your health care provider. °  °Document Released: 03/31/2001 Document Revised: 12/28/2011 Document Reviewed: 06/11/2015 °Elsevier Interactive Patient Education ©2016 [employer] ° °

## 2016-01-30 NOTE — ED Notes (Signed)
IV team at the bedside. 

## 2016-10-05 ENCOUNTER — Ambulatory Visit (INDEPENDENT_AMBULATORY_CARE_PROVIDER_SITE_OTHER): Payer: 59 | Admitting: Endocrinology

## 2016-10-05 ENCOUNTER — Encounter: Payer: Self-pay | Admitting: Endocrinology

## 2016-10-05 DIAGNOSIS — I10 Essential (primary) hypertension: Secondary | ICD-10-CM | POA: Insufficient documentation

## 2016-10-05 LAB — RENAL FUNCTION PANEL
ALBUMIN: 4.8 g/dL (ref 3.5–5.2)
BUN: 11 mg/dL (ref 6–23)
CALCIUM: 10.6 mg/dL — AB (ref 8.4–10.5)
CO2: 25 meq/L (ref 19–32)
CREATININE: 0.58 mg/dL (ref 0.40–1.20)
Chloride: 103 mEq/L (ref 96–112)
GFR: 140.96 mL/min (ref >60.00)
GLUCOSE: 70 mg/dL (ref 70–99)
Phosphorus: 2.7 mg/dL (ref 2.3–4.6)
Potassium: 3.3 mEq/L — ABNORMAL LOW (ref 3.5–5.1)
Sodium: 140 mEq/L (ref 135–145)

## 2016-10-05 LAB — VITAMIN D 25 HYDROXY (VIT D DEFICIENCY, FRACTURES): VITD: 14.58 ng/mL — AB (ref 30.00–100.00)

## 2016-10-05 NOTE — Progress Notes (Signed)
Patient ID: [name] [name], female   DOB: 20-Oct-1965, 50 y.o.   MRN: [id number]            Chief complaint: High calcium   Referring physician: Dr. Nehemiah SettlePolite   History of Present Illness:  Referring physician:   Review of records show that she had a high calcium done in October The patient thinks that she has had higher calcium before also but no records are available Apparently at some point she was taking HCTZ and this was stopped with some improvement in calcium Calcium level on 08/06/16 was 11.2, corrected calcium was 10.5, normal up to 10.3   Lab Results  Component Value Date   CALCIUM 10.6 (H) 10/05/2016   CALCIUM 9.1 01/30/2016   CALCIUM 11.0 (H) 01/29/2016    The hypercalcemia is not associated with any History of fractures, no history of kidney stones or renal insufficiency, has not had any sarcoidosis, known carcinoma and no thyroid disease She has not been on any vitamin D supplements or calcium and also she does not drink any milk  Prior serologic and radiologic studies have included:  PTH level from unknown lab 29 done in 10/17 Ionized calcium report not available  The patient thinks that in 2017 she had a bone density screening at her work and the result at the heel was reportedly normal   Lab Results  Component Value Date   CALCIUM 10.6 (H) 10/05/2016   PHOS 2.7 10/05/2016      25 (OH) Vitamin D level   Prior treatment has included  Allergies as of 10/05/2016      Reactions   Penicillins Hives      Medication List       Accurate as of 10/05/16  4:49 PM. Always use your most recent med list.          amLODipine 10 MG tablet Commonly known as:  NORVASC Take 10 mg by mouth daily.   metFORMIN 500 MG tablet Commonly known as:  GLUCOPHAGE Take 1,000 mg by mouth every evening.   multivitamin with minerals Tabs tablet Take 1 tablet by mouth daily.   simvastatin 20 MG tablet Commonly known as:  ZOCOR Take 20 mg by mouth at bedtime.     SYSTANE OP Apply 1-2 drops to eye daily as needed (dry eyes).   valsartan 160 MG tablet Commonly known as:  DIOVAN Take 160 mg by mouth daily.       Allergies:  Allergies  Allergen Reactions  . Penicillins Hives    Past Medical History:  Diagnosis Date  . Diabetes mellitus without complication (HCC)   . Hyperlipidemia   . Hypertension   . Obesity     Past Surgical History:  Procedure Laterality Date  . CESAREAN SECTION    . PARTIAL HYSTERECTOMY  2005    Family History  Problem Relation Age of Onset  . Hypertension Other     Social History:  reports that she has never smoked. She does not have any smokeless tobacco history on file. She reports that she drinks alcohol. She reports that she does not use drugs.  Review of Systems  Constitutional: Negative for weight loss.  Respiratory: Negative for cough and shortness of breath.   Cardiovascular: Negative for leg swelling.  Gastrointestinal: Negative for abdominal pain.  Endocrine:       Sweating spells and hot flushes 1 year.  She was told by her gynecologist only to use half of the estradiol patch, this helped a little.  She reportedly has had good control of her diabetes with last A1c 6.8  Musculoskeletal: Negative for joint pain and back pain.  Neurological: Negative for weakness.     EXAM:  BP 130/84   Pulse 98   Ht 5' (1.524 m)   Wt 187 lb (84.8 kg)   SpO2 96%   BMI 36.52 kg/m   GENERAL: Averagely built and nourished  No pallor, clubbing, lymphadenopathy or edema.   Skin:  no rash or pigmentation.  EYES:  Externally normal.  ENT: Oral mucosa and tongue normal.  THYROID:  Not palpable.  No other mass palpable in the neck  HEART:  Normal  S1 and S2; no murmur or click.  CHEST:  Normal shape Lungs:   Vescicular breath sounds heard equally.  No crepitations/ wheeze.  ABDOMEN:  No distention.  Liver and spleen not palpable.  No other mass or tenderness.  NEUROLOGICAL: .Reflexes are normal  bilaterally at biceps.  SPINE AND JOINTS:  Normal.   Assessment/Plan:   HYPERCALCEMIA:  Patient has had mild hypercalcemia for unknown duration of time She has had no symptoms for this Also no evidence of end organ side effects such as osteopenia or nephrolithiasis Although most likely she has primary hyperparathyroidism her PTH level is 29, this still is compatible with primary hyperparathyroidism. However will need to also evaluate vitamin D  metabolites to rule out other causes Also have requested more records from the PCP including reports of ionized calcium  Currently she does not meet the criteria for surgery with the level of hypercalcemia Discussed the nature of primary hyperparathyroidism as well as normal role of the parathyroid glands. Discussed potential  effects of hyperparathyroidism long-term on bone health, kidney stones and kidney function Explained to patient that surgery is indicated only there are symptoms of high calcium, calcium level over 1 point above the normal range or known osteoporosis.  Explained that if surgery is indicated this would be done after doing a parathyroid scan and most likely if the patient has single adenoma will need minimally invasive surgery  If her labs are normal will have her follow up annually and she can also follow up with her PCP periodically  [facility] OakKUMAR,[name] [name] 10/05/2016, 4:49 PM   Consultation note sent to PCP  Note: This office note was prepared with Dragon voice recognition system technology. Any transcriptional errors that result from this process are unintentional.

## 2016-10-09 LAB — VITAMIN D 1,25 DIHYDROXY
Vitamin D 1, 25 (OH)2 Total: 90 pg/mL
Vitamin D3 1, 25 (OH)2: 90 pg/mL

## 2016-10-22 ENCOUNTER — Telehealth: Payer: Self-pay | Admitting: Endocrinology

## 2016-10-22 NOTE — Telephone Encounter (Signed)
Pt called in very concerned because she has not heard back about her lab results, she requests call back as soon as possible. [phone]

## 2016-10-23 NOTE — Telephone Encounter (Signed)
Pt is calling for lab results, please review

## 2016-10-23 NOTE — Telephone Encounter (Signed)
Please let her know that the calcium was only slightly high at 10.6 and she will not need surgery. Vitamin D levels show normal active vitamin D component so she does not need any vitamin D supplement. Labs have been sent to PCP: She needs to call them because of slightly low potassium Also need to get cumulative previous Labs from Dr. Conservation officer, historic buildingspolite as these are still pending

## 2016-10-25 NOTE — Telephone Encounter (Signed)
Please call patient with information from my note on  Friday

## 2016-10-27 NOTE — Telephone Encounter (Signed)
Attempted to call pt, no answer and no vm. Mailed results to pt's home.

## 2017-07-28 ENCOUNTER — Encounter (HOSPITAL_COMMUNITY): Payer: Self-pay

## 2017-07-28 ENCOUNTER — Inpatient Hospital Stay (HOSPITAL_COMMUNITY)
Admission: EM | Admit: 2017-07-28 | Discharge: 2017-08-06 | DRG: 234 | Disposition: A | Discharge status: Home / Self Care | Payer: 59 | Attending: Thoracic Surgery (Cardiothoracic Vascular Surgery) | Admitting: Thoracic Surgery (Cardiothoracic Vascular Surgery)

## 2017-07-28 ENCOUNTER — Emergency Department (HOSPITAL_COMMUNITY): Payer: 59

## 2017-07-28 DIAGNOSIS — I493 Ventricular premature depolarization: Secondary | ICD-10-CM | POA: Diagnosis not present

## 2017-07-28 DIAGNOSIS — Z6837 Body mass index (BMI) 37.0-37.9, adult: Secondary | ICD-10-CM

## 2017-07-28 DIAGNOSIS — E669 Obesity, unspecified: Secondary | ICD-10-CM | POA: Diagnosis present

## 2017-07-28 DIAGNOSIS — I25110 Atherosclerotic heart disease of native coronary artery with unstable angina pectoris: Secondary | ICD-10-CM | POA: Diagnosis present

## 2017-07-28 DIAGNOSIS — I214 Non-ST elevation (NSTEMI) myocardial infarction: Secondary | ICD-10-CM | POA: Diagnosis present

## 2017-07-28 DIAGNOSIS — D62 Acute posthemorrhagic anemia: Secondary | ICD-10-CM | POA: Diagnosis not present

## 2017-07-28 DIAGNOSIS — R000 Tachycardia, unspecified: Secondary | ICD-10-CM | POA: Diagnosis not present

## 2017-07-28 DIAGNOSIS — Z7984 Long term (current) use of oral hypoglycemic drugs: Secondary | ICD-10-CM | POA: Diagnosis not present

## 2017-07-28 DIAGNOSIS — E8770 Fluid overload, unspecified: Secondary | ICD-10-CM | POA: Diagnosis not present

## 2017-07-28 DIAGNOSIS — I10 Essential (primary) hypertension: Secondary | ICD-10-CM | POA: Diagnosis present

## 2017-07-28 DIAGNOSIS — E876 Hypokalemia: Secondary | ICD-10-CM | POA: Diagnosis not present

## 2017-07-28 DIAGNOSIS — I313 Pericardial effusion (noninflammatory): Secondary | ICD-10-CM | POA: Diagnosis present

## 2017-07-28 DIAGNOSIS — E785 Hyperlipidemia, unspecified: Secondary | ICD-10-CM | POA: Diagnosis present

## 2017-07-28 DIAGNOSIS — Z880 Allergy status to penicillin: Secondary | ICD-10-CM

## 2017-07-28 DIAGNOSIS — Z90710 Acquired absence of both cervix and uterus: Secondary | ICD-10-CM | POA: Diagnosis not present

## 2017-07-28 DIAGNOSIS — J9811 Atelectasis: Secondary | ICD-10-CM

## 2017-07-28 DIAGNOSIS — Z951 Presence of aortocoronary bypass graft: Secondary | ICD-10-CM

## 2017-07-28 DIAGNOSIS — I071 Rheumatic tricuspid insufficiency: Secondary | ICD-10-CM | POA: Diagnosis present

## 2017-07-28 DIAGNOSIS — E1122 Type 2 diabetes mellitus with diabetic chronic kidney disease: Secondary | ICD-10-CM | POA: Diagnosis present

## 2017-07-28 DIAGNOSIS — I252 Old myocardial infarction: Secondary | ICD-10-CM | POA: Diagnosis present

## 2017-07-28 DIAGNOSIS — Z01810 Encounter for preprocedural cardiovascular examination: Secondary | ICD-10-CM | POA: Diagnosis not present

## 2017-07-28 LAB — CBC
HEMATOCRIT: 40.4 % (ref 36.0–46.0)
HEMOGLOBIN: 12.9 g/dL (ref 12.0–15.0)
MCH: 25.2 pg — ABNORMAL LOW (ref 26.0–34.0)
MCHC: 31.9 g/dL (ref 30.0–36.0)
MCV: 79.1 fL (ref 78.0–100.0)
Platelets: 348 10*3/uL (ref 150–400)
RBC: 5.11 MIL/uL (ref 3.87–5.11)
RDW: 14.4 % (ref 11.5–15.5)
WBC: 12.6 10*3/uL — ABNORMAL HIGH (ref 4.0–10.5)

## 2017-07-28 LAB — APTT: APTT: 37 s — AB (ref 24–36)

## 2017-07-28 LAB — I-STAT TROPONIN, ED: TROPONIN I, POC: 3.86 ng/mL — AB (ref 0.00–0.08)

## 2017-07-28 LAB — URINALYSIS, ROUTINE W REFLEX MICROSCOPIC
Bilirubin Urine: NEGATIVE
Glucose, UA: NEGATIVE mg/dL
HGB URINE DIPSTICK: NEGATIVE
Ketones, ur: 5 mg/dL — AB
Nitrite: NEGATIVE
PROTEIN: NEGATIVE mg/dL
SPECIFIC GRAVITY, URINE: 1.018 (ref 1.005–1.030)
pH: 5 (ref 5.0–8.0)

## 2017-07-28 LAB — BASIC METABOLIC PANEL
ANION GAP: 11 (ref 5–15)
BUN: 23 mg/dL — AB (ref 6–20)
CO2: 22 mmol/L (ref 22–32)
Calcium: 10.8 mg/dL — ABNORMAL HIGH (ref 8.9–10.3)
Chloride: 105 mmol/L (ref 101–111)
Creatinine, Ser: 0.84 mg/dL (ref 0.44–1.00)
GFR calc Af Amer: >60 mL/min (ref >60)
GLUCOSE: 116 mg/dL — AB (ref 65–99)
POTASSIUM: 3.8 mmol/L (ref 3.5–5.1)
Sodium: 138 mmol/L (ref 135–145)

## 2017-07-28 LAB — LIPID PANEL
CHOL/HDL RATIO: 6.6 ratio
Cholesterol: 237 mg/dL — ABNORMAL HIGH (ref 0–200)
HDL: 36 mg/dL — ABNORMAL LOW (ref >40)
LDL CALC: 167 mg/dL — AB (ref 0–99)
Triglycerides: 169 mg/dL — ABNORMAL HIGH (ref <150)
VLDL: 34 mg/dL (ref 0–40)

## 2017-07-28 LAB — I-STAT BETA HCG BLOOD, ED (MC, WL, AP ONLY)

## 2017-07-28 LAB — PROTIME-INR
INR: 1.01
PROTHROMBIN TIME: 13.2 s (ref 11.4–15.2)

## 2017-07-28 LAB — TROPONIN I
Troponin I: 4.24 ng/mL (ref <0.03)
Troponin I: 4.45 ng/mL (ref <0.03)

## 2017-07-28 MED ORDER — LOSARTAN POTASSIUM 50 MG PO TABS
50.0000 mg | ORAL_TABLET | Freq: Every day | ORAL | Status: DC
  Administered 2017-07-29: 50 mg via ORAL
  Filled 2017-07-28: qty 1

## 2017-07-28 MED ORDER — HEPARIN (PORCINE) IN NACL 100-0.45 UNIT/ML-% IJ SOLN
10.0000 [IU]/kg/h | INTRAMUSCULAR | Status: DC
Start: 2017-07-28 — End: 2017-07-28

## 2017-07-28 MED ORDER — ATORVASTATIN CALCIUM 80 MG PO TABS
80.0000 mg | ORAL_TABLET | Freq: Every day | ORAL | Status: DC
  Administered 2017-07-29 – 2017-08-05 (×7): 80 mg via ORAL
  Filled 2017-07-28 (×7): qty 1

## 2017-07-28 MED ORDER — ACETAMINOPHEN 325 MG PO TABS: 650.0000 mg | ORAL_TABLET | Freq: Four times a day (QID) | ORAL | Status: DC | PRN

## 2017-07-28 MED ORDER — SODIUM CHLORIDE 0.9 % IV SOLN
INTRAVENOUS | Status: DC
  Administered 2017-07-28: 20 mL/h via INTRAVENOUS

## 2017-07-28 MED ORDER — ASPIRIN 81 MG PO CHEW
324.0000 mg | CHEWABLE_TABLET | Freq: Once | ORAL | Status: AC
  Administered 2017-07-28: 324 mg via ORAL
  Filled 2017-07-28: qty 4

## 2017-07-28 MED ORDER — POLYVINYL ALCOHOL 1.4 % OP SOLN: 1.0000 [drp] | Freq: Every day | OPHTHALMIC | Status: DC | PRN

## 2017-07-28 MED ORDER — NITROGLYCERIN 0.4 MG SL SUBL
0.4000 mg | SUBLINGUAL_TABLET | SUBLINGUAL | Status: DC | PRN
  Administered 2017-07-29: 0.4 mg via SUBLINGUAL
  Filled 2017-07-28: qty 1

## 2017-07-28 MED ORDER — HEPARIN BOLUS VIA INFUSION
4000.0000 [IU] | Freq: Once | INTRAVENOUS | Status: AC
  Administered 2017-07-28: 4000 [IU] via INTRAVENOUS
  Filled 2017-07-28: qty 4000

## 2017-07-28 MED ORDER — ONDANSETRON HCL 4 MG/2ML IJ SOLN: 4.0000 mg | Freq: Four times a day (QID) | INTRAMUSCULAR | Status: DC | PRN

## 2017-07-28 MED ORDER — HEPARIN (PORCINE) IN NACL 100-0.45 UNIT/ML-% IJ SOLN
800.0000 [IU]/h | INTRAMUSCULAR | Status: DC
  Administered 2017-07-28: 800 [IU]/h via INTRAVENOUS
  Filled 2017-07-28: qty 250

## 2017-07-28 MED ORDER — METOPROLOL TARTRATE 12.5 MG HALF TABLET
12.5000 mg | ORAL_TABLET | Freq: Two times a day (BID) | ORAL | Status: DC
  Administered 2017-07-28 – 2017-07-29 (×3): 12.5 mg via ORAL
  Filled 2017-07-28 (×3): qty 1

## 2017-07-28 MED ORDER — HEPARIN SODIUM (PORCINE) 5000 UNIT/ML IJ SOLN: 4000.0000 [IU] | Freq: Once | INTRAMUSCULAR | Status: DC

## 2017-07-28 MED ORDER — AMLODIPINE BESYLATE 10 MG PO TABS
10.0000 mg | ORAL_TABLET | Freq: Every day | ORAL | Status: DC
  Administered 2017-07-29: 10 mg via ORAL
  Filled 2017-07-28: qty 1

## 2017-07-28 MED ORDER — ASPIRIN EC 81 MG PO TBEC
81.0000 mg | DELAYED_RELEASE_TABLET | Freq: Every day | ORAL | Status: DC
  Administered 2017-07-29: 81 mg via ORAL
  Filled 2017-07-28: qty 1

## 2017-07-28 NOTE — ED Notes (Signed)
Pt provided with turkey sandwich, crackers and ice water. 

## 2017-07-28 NOTE — ED Notes (Signed)
Informed first nurse Lurena Joiner of troponin 3.86

## 2017-07-28 NOTE — ED Provider Notes (Signed)
[facility]-EMERGENCY DEPT Provider Note   CSN: [id number] Arrival date & time: 07/28/17  1656  History   Chief Complaint Chief Complaint  Patient presents with  . Abnormal Lab  . Chest Pain   [name] [name] is a 51 y.o. female.  The patient is a 51 year old female with past medical history significant for type 2 diabetes, obesity, hypertension, and hyperlipidemia, who presents to the EDcomplaining of chest pain.  The patient has had intermittent chest pain for the past 2-3 weeks. She reports an initial pressure in her left chest with radiation into her jaw and down her left arm with associated numbness and tingling. The symptoms were intermittent and would resolve spontaneously, however they became severe on Saturday.  She took Nexium with relief on Saturday evening, however the symptoms then returned.  She went to her primary care doctor's office today current EKG and blood work were obtained. Her doctor instructed her to come to the ED immediately.   The history is provided by the patient and medical records. No language interpreter was used.    Past Medical History:  Diagnosis Date  . Diabetes mellitus without complication (HCC)   . Hyperlipidemia   . Hypertension   . Obesity    Patient Active Problem List   Diagnosis Date Noted  . Hypercalcemia 10/05/2016  . Essential hypertension 10/05/2016   Past Surgical History:  Procedure Laterality Date  . CESAREAN SECTION    . PARTIAL HYSTERECTOMY  2005   OB History    No data available     Home Medications    Prior to Admission medications   Medication Sig Start Date End Date Taking? Authorizing Provider  amLODipine (NORVASC) 10 MG tablet Take 10 mg by mouth daily.    [provider]  metFORMIN (GLUCOPHAGE) 500 MG tablet Take 1,000 mg by mouth every evening.     [provider]  Multiple Vitamin (MULTIVITAMIN WITH MINERALS) TABS tablet Take 1 tablet by mouth daily.    [provider]  Polyethyl  Glycol-Propyl Glycol (SYSTANE OP) Apply 1-2 drops to eye daily as needed (dry eyes).    [provider]  simvastatin (ZOCOR) 20 MG tablet Take 20 mg by mouth at bedtime.    [provider]  valsartan (DIOVAN) 160 MG tablet Take 160 mg by mouth daily.    [provider]   Family History Family History  Problem Relation Age of Onset  . Hypertension Other    Social History Social History  Substance Use Topics  . Smoking status: Never Smoker  . Smokeless tobacco: Never Used  . Alcohol use Yes   Allergies   Penicillins  Review of Systems Review of Systems  Constitutional: Negative for chills and fever.  HENT: Negative for ear pain and sore throat.   Eyes: Negative for visual disturbance.  Respiratory: Negative for cough and shortness of breath.   Cardiovascular: Positive for chest pain. Negative for palpitations and leg swelling (chronic).  Gastrointestinal: Negative for abdominal pain and vomiting.  Genitourinary: Negative for dysuria and hematuria.  Musculoskeletal: Negative for arthralgias and back pain.  Skin: Negative for color change and rash.  Allergic/Immunologic: Negative for immunocompromised state.  Neurological: Negative for seizures and syncope.  Hematological: Negative.   Psychiatric/Behavioral: Negative.   All other systems reviewed and are negative.  Physical Exam Updated Vital Signs BP (!) 144/99   Pulse (!) 112   Temp 98.4 F (36.9 C) (Oral)   Resp 16   Wt 84.8 kg (187 lb)  SpO2 98%   BMI 36.52 kg/m   Physical Exam  Constitutional: She is oriented to person, place, and time. She appears well-developed and well-nourished. No distress.  HENT:  Head: Normocephalic and atraumatic.  Mouth/Throat: Oropharynx is clear and moist.  Eyes: Conjunctivae and EOM are normal.  Neck: Neck supple.  Cardiovascular: Normal rate, regular rhythm, normal heart sounds and intact distal pulses.   No murmur heard. Pulmonary/Chest: Effort normal  and breath sounds normal. No respiratory distress. She has no wheezes.  Abdominal: Soft. She exhibits no mass. There is no tenderness. There is no guarding.  Musculoskeletal: She exhibits no edema or tenderness.  Neurological: She is alert and oriented to person, place, and time.  Skin: Skin is warm and dry.  Psychiatric: She has a normal mood and affect. Her behavior is normal. Judgment and thought content normal.  Nursing note and vitals reviewed.  ED Treatments / Results  Labs (all labs ordered are listed, but only abnormal results are displayed) Labs Reviewed  BASIC METABOLIC PANEL - Abnormal; Notable for the following:       Result Value   Glucose, Bld 116 (*)    BUN 23 (*)    Calcium 10.8 (*)    All other components within normal limits  I-STAT TROPONIN, ED - Abnormal; Notable for the following:    Troponin i, poc 3.86 (*)    All other components within normal limits  CBC   EKG  EKG Interpretation None      EKG: - Rate: tachycardia (HR 110 bpm) - Rhythm: normal sinus - Axis: no axis deviation - Intervals: normal PR, narrow QRS complex, and normal QTc - ST/T waves: ST depression in lateral and inferior leads - Comparison to prior: none available at this time  Radiology Dg Chest 2 View  Result Date: 07/28/2017 CLINICAL DATA:  Midsternal chest pain extending down the left upper extremity, several days duration. EXAM: CHEST  2 VIEW COMPARISON:  05/14/2004. FINDINGS: Unchanged borderline cardiomegaly. The lungs are clear. The pulmonary vasculature is normal. There is no pleural effusion. Hilar and mediastinal contours are unremarkable and unchanged. IMPRESSION: Stable borderline cardiomegaly.  No consolidation or effusion. Electronically Signed   By: Ellery Plunk M.D.   On: 07/28/2017 18:22   Procedures Procedures (including critical care time)  Medications Ordered in ED Medications - No data to display  Initial Impression / Assessment and Plan / ED Course  I  have reviewed the triage vital signs and the nursing notes.  Pertinent labs & imaging results that were available during my care of the patient were reviewed by me and considered in my medical decision making (see chart for details).    Pertinent labs included an initial iStat troponin elevated to 3.86, followed by a lab troponin 4.24.  CBC with mild leukocytosis. No anemia or platelet count abnormalities.  BMP notable for hypercalcemia. No AKI or anion gap. Lipid panel pending.  UA without evidence of infection.  EKG notable for sinus tachycardia with ST depression in the inferolateral leads.  Imaging studies included a CXR with cardiomegaly.  No consolidation, effusion, or widening of the mediastinum noted.  The above findings were most concerning for an NSTEMI at this time.  The patient was given  ASA and a heparin bolus followed by initiation of a heparin drip.  Upon reassessment, the patient remained chest pain free with no new symptoms or concerns. Her tachycardia persisted, and her blood pressure remained stable. I consulted cardiology for admission, and they were  in agreement with the plan.  The patient was admitted to the cardiology service for further evaluation and treatment.  The patient was in fair condition at the time of admission.  Final Clinical Impressions(s) / ED Diagnoses   Final diagnoses:  NSTEMI (non-ST elevated myocardial infarction) [facility])  Hypercalcemia   New Prescriptions New Prescriptions   No medications on file     Levester Fresh, MD 07/29/17 0101    Cathren Laine, MD 07/30/17 1259

## 2017-07-28 NOTE — ED Notes (Signed)
Dr. Rosalia Hammers notified of elevated Troponin = 3.86

## 2017-07-28 NOTE — H&P (Signed)
Cardiology History & Physical    Patient ID: SHAWNTAE LOWY MRN: [id number], DOB: 03/01/1966 Date of Encounter: 07/28/2017, 9:47 PM Primary Physician: Renford Dills, MD  Chief Complaint: CP   HPI: TYJANAE BARTEK is a 51 y.o. female with history of HTN, DM2 who presents with CP.  Over the past 2-3 weeks, pt had the onset of SSCP with radiation to the jaw, which was exacerbated by exertion.  5 days PTA, the pt had significant worsening of her chest discomfort, but her symptoms initially responded to Nexium.  Today, she noticed significant CP and SOB after climbing one flight of stairs.  She subsequently saw her PCP, who recommended she present to the ED after doing an ECG and drawing labs.  In the ED, her initial troponin was 3.86.  ECG showed subtle inferior and apical STD.  She was started on heparin gtt.  The pt was CP free at the time of my interview.  Past Medical History:  Diagnosis Date  . Diabetes mellitus without complication (HCC)   . Hyperlipidemia   . Hypertension   . Obesity      Surgical History:  Past Surgical History:  Procedure Laterality Date  . CESAREAN SECTION    . PARTIAL HYSTERECTOMY  2005     Home Meds: Prior to Admission medications   Medication Sig Start Date End Date Taking? Authorizing Provider  amLODipine (NORVASC) 10 MG tablet Take 10 mg by mouth daily.   Yes [provider]  glyBURIDE (DIABETA) 5 MG tablet Take 5 mg by mouth daily. 07/12/17  Yes [provider]  losartan (COZAAR) 50 MG tablet Take 50 mg by mouth daily. 07/12/17  Yes [provider]  metFORMIN (GLUCOPHAGE) 500 MG tablet Take 1,000 mg by mouth 2 (two) times daily.    Yes [provider]  Polyethyl Glycol-Propyl Glycol (SYSTANE OP) Place 1-2 drops into both eyes daily as needed (dry eyes).    Yes [provider]  simvastatin (ZOCOR) 20 MG tablet Take 20 mg by mouth at bedtime.   Yes [provider]    Allergies:  Allergies    Allergen Reactions  . Penicillins Hives    Social History   Social History  . Marital status: Single    Spouse name: N/A  . Number of children: N/A  . Years of education: N/A   Occupational History  . Not on file.   Social History Main Topics  . Smoking status: Never Smoker  . Smokeless tobacco: Never Used  . Alcohol use Yes  . Drug use: No  . Sexual activity: Not on file   Other Topics Concern  . Not on file   Social History Narrative  . No narrative on file     Family History  Problem Relation Age of Onset  . Hypertension Other     Review of Systems: All other systems reviewed and are otherwise negative except as noted above.  Labs:  Lab Results  Component Value Date   WBC 12.6 (H) 07/28/2017   HGB 12.9 07/28/2017   HCT 40.4 07/28/2017   MCV 79.1 07/28/2017   PLT 348 07/28/2017    Recent Labs Lab 07/28/17 1734  NA 138  K 3.8  CL 105  CO2 22  BUN 23*  CREATININE 0.84  CALCIUM 10.8*  GLUCOSE 116*    Recent Labs  07/28/17 1945  TROPONINI 4.24*   Lab Results  Component Value Date   CHOL 237 (H) 07/28/2017   HDL  36 (L) 07/28/2017   LDLCALC 167 (H) 07/28/2017   TRIG 169 (H) 07/28/2017   No results found for: DDIMER  Radiology/Studies:  Dg Chest 2 View  Result Date: 07/28/2017 CLINICAL DATA:  Midsternal chest pain extending down the left upper extremity, several days duration. EXAM: CHEST  2 VIEW COMPARISON:  05/14/2004. FINDINGS: Unchanged borderline cardiomegaly. The lungs are clear. The pulmonary vasculature is normal. There is no pleural effusion. Hilar and mediastinal contours are unremarkable and unchanged. IMPRESSION: Stable borderline cardiomegaly.  No consolidation or effusion. Electronically Signed   By: Ellery Plunk M.D.   On: 07/28/2017 18:22   Wt Readings from Last 3 Encounters:  07/28/17 84.4 kg (186 lb)  10/05/16 84.8 kg (187 lb)  01/10/14 85.5 kg (188 lb 6.4 oz)    EKG: NSR, subtle inferior/apical STD.  Physical  Exam: Blood pressure (!) 128/101, pulse (!) 104, temperature 98.4 F (36.9 C), temperature source Oral, resp. rate 15, height 5' (1.524 m), weight 84.4 kg (186 lb), SpO2 96 %. Body mass index is 36.33 kg/m. General: Well developed, well nourished, in no acute distress. Head: Normocephalic, atraumatic, sclera non-icteric, no xanthomas, nares are without discharge.  Neck: Negative for carotid bruits. JVD not elevated. Lungs: Clear bilaterally to auscultation without wheezes, rales, or rhonchi. Breathing is unlabored. Heart: RRR with S1 S2. No murmurs, rubs, or gallops appreciated. Abdomen: Soft, non-tender, non-distended with normoactive bowel sounds. No hepatomegaly. No rebound/guarding. No obvious abdominal masses. Msk:  Strength and tone appear normal for age. Extremities: No clubbing or cyanosis. No edema.  Distal pedal pulses are 2+ and equal bilaterally. Neuro: Alert and oriented X 3. No focal deficit. No facial asymmetry. Moves all extremities spontaneously. Psych:  Responds to questions appropriately with a normal affect.    Assessment and Plan  51 y.o. female with history of HTN, DM2 who presents with CP, found to have NSTEMI.  1. NSTEMI: Continue heparin gtt and keep NPO in anticipation of cath tomorrow.  TTE in AM.  Start low dose metoprolol, continue ASA 81, and replace home simvastatin with high intensity atorvastatin.  2.  HTN: Continue home amlodipine and losartan.  Adding metoprolol as above.  3.  DM2: Monitor FSBG, AISS if needed.  Hold metformin in anticipation of contrast load.  Signed, Esmond Plants, MD 07/28/2017, 9:47 PM

## 2017-07-28 NOTE — ED Triage Notes (Signed)
Pt sent from pcp d/t abnormal EKG and lab results related to heart. Pt unsure of what the results were but states the physician called her today and instructed her to come here. Pt reports having chest pain on and off over the weekend but none at this time.

## 2017-07-28 NOTE — Progress Notes (Signed)
ANTICOAGULATION CONSULT NOTE - Initial Consult  Pharmacy Consult for heparin dosing Indication: chest pain/ACS  Allergies  Allergen Reactions  . Penicillins Hives    Patient Measurements: Weight: 187 lb (84.8 kg) Heparin Dosing Weight: 67.15   Vital Signs: Temp: 98.4 F (36.9 C) (10/10 1728) Temp Source: Oral (10/10 1728) BP: 144/99 (10/10 1728) Pulse Rate: 112 (10/10 1728)  Labs:  Recent Labs  07/28/17 1734  HGB 12.9  HCT 40.4  PLT 348  CREATININE 0.84    CrCl cannot be calculated (Unknown ideal weight.).   Medical History: Past Medical History:  Diagnosis Date  . Diabetes mellitus without complication (HCC)   . Hyperlipidemia   . Hypertension   . Obesity      Assessment: 10 YOF admitted from PCP with abnormal EKG and labs. Had chest pain on and off over weekend. Hgb 12.9, Hct 40.4, Plt 348. Trop 3.86, EKG ST depression. No anticoag PTA. No bleeding reported.   Goal of Therapy:  Heparin level 0.3-0.7 units/ml Monitor platelets by anticoagulation protocol: Yes   Plan:  Give 4000 units bolus x 1 Start heparin infusion at 800  Units/hr. 6 hour heparin level, daily heparin and CBC Monitor s/sx of bleeding.   Emeline General, PharmD Candidate  07/28/2017,7:06 PM

## 2017-07-28 NOTE — ED Notes (Signed)
4.24 Troponin, MD aware. No further orders at this time.

## 2017-07-29 ENCOUNTER — Encounter (HOSPITAL_COMMUNITY)
Admission: EM | Disposition: A | Discharge status: Home / Self Care | Payer: Self-pay | Source: Home / Self Care | Attending: Thoracic Surgery (Cardiothoracic Vascular Surgery)

## 2017-07-29 ENCOUNTER — Inpatient Hospital Stay (HOSPITAL_COMMUNITY): Payer: 59

## 2017-07-29 ENCOUNTER — Encounter (HOSPITAL_COMMUNITY): Payer: Self-pay | Admitting: Cardiology

## 2017-07-29 ENCOUNTER — Other Ambulatory Visit (HOSPITAL_COMMUNITY): Payer: 59

## 2017-07-29 DIAGNOSIS — I25110 Atherosclerotic heart disease of native coronary artery with unstable angina pectoris: Secondary | ICD-10-CM

## 2017-07-29 DIAGNOSIS — Z01810 Encounter for preprocedural cardiovascular examination: Secondary | ICD-10-CM

## 2017-07-29 DIAGNOSIS — I214 Non-ST elevation (NSTEMI) myocardial infarction: Secondary | ICD-10-CM

## 2017-07-29 HISTORY — PX: LEFT HEART CATH AND CORONARY ANGIOGRAPHY: CATH118249

## 2017-07-29 LAB — URINALYSIS, COMPLETE (UACMP) WITH MICROSCOPIC
Bilirubin Urine: NEGATIVE
Glucose, UA: NEGATIVE mg/dL
HGB URINE DIPSTICK: NEGATIVE
Ketones, ur: NEGATIVE mg/dL
NITRITE: NEGATIVE
PROTEIN: NEGATIVE mg/dL
SPECIFIC GRAVITY, URINE: 1.036 — AB (ref 1.005–1.030)
pH: 6 (ref 5.0–8.0)

## 2017-07-29 LAB — VAS US DOPPLER PRE CABG
LCCADDIAS: -13 cm/s
LCCAPDIAS: 17 cm/s
LEFT ECA DIAS: -12 cm/s
LEFT VERTEBRAL DIAS: -15 cm/s
LICADDIAS: -39 cm/s
LICADSYS: -80 cm/s
LICAPDIAS: -9 cm/s
LICAPSYS: -32 cm/s
Left CCA dist sys: -61 cm/s
Left CCA prox sys: 80 cm/s
RIGHT ECA DIAS: -9 cm/s
RIGHT VERTEBRAL DIAS: 10 cm/s
Right CCA prox dias: 10 cm/s
Right CCA prox sys: 85 cm/s
Right cca dist sys: -49 cm/s

## 2017-07-29 LAB — PULMONARY FUNCTION TEST
DL/VA % PRED: 141 %
DL/VA: 6.02 ml/min/mmHg/L
DLCO cor % pred: 87 %
DLCO cor: 16.43 ml/min/mmHg
DLCO unc % pred: 82 %
DLCO unc: 15.51 ml/min/mmHg
FEF 25-75 Pre: 2.18 L/sec
FEF2575-%PRED-PRE: 103 %
FEV1-%PRED-PRE: 74 %
FEV1-Pre: 1.45 L
FEV1FVC-%Pred-Pre: 112 %
FEV6-%Pred-Pre: 68 %
FEV6-PRE: 1.59 L
FEV6FVC-%Pred-Pre: 103 %
FVC-%Pred-Pre: 65 %
FVC-Pre: 1.59 L
Pre FEV1/FVC ratio: 91 %
Pre FEV6/FVC Ratio: 100 %
RV % PRED: 81 %
RV: 1.31 L
TLC % pred: 72 %
TLC: 3.24 L

## 2017-07-29 LAB — CBC
HCT: 36.4 % (ref 36.0–46.0)
Hemoglobin: 11.7 g/dL — ABNORMAL LOW (ref 12.0–15.0)
MCH: 25.4 pg — ABNORMAL LOW (ref 26.0–34.0)
MCHC: 32.1 g/dL (ref 30.0–36.0)
MCV: 79 fL (ref 78.0–100.0)
Platelets: 298 10*3/uL (ref 150–400)
RBC: 4.61 MIL/uL (ref 3.87–5.11)
RDW: 14.2 % (ref 11.5–15.5)
WBC: 11.3 10*3/uL — ABNORMAL HIGH (ref 4.0–10.5)

## 2017-07-29 LAB — BASIC METABOLIC PANEL
Anion gap: 6 (ref 5–15)
BUN: 17 mg/dL (ref 6–20)
CO2: 24 mmol/L (ref 22–32)
Calcium: 9.9 mg/dL (ref 8.9–10.3)
Chloride: 106 mmol/L (ref 101–111)
Creatinine, Ser: 0.78 mg/dL (ref 0.44–1.00)
GFR calc Af Amer: >60 mL/min (ref >60)
GFR calc non Af Amer: >60 mL/min (ref >60)
Glucose, Bld: 194 mg/dL — ABNORMAL HIGH (ref 65–99)
Potassium: 3.5 mmol/L (ref 3.5–5.1)
Sodium: 136 mmol/L (ref 135–145)

## 2017-07-29 LAB — HIV ANTIBODY (ROUTINE TESTING W REFLEX): HIV Screen 4th Generation wRfx: NONREACTIVE

## 2017-07-29 LAB — BLOOD GAS, ARTERIAL
ACID-BASE EXCESS: 0.2 mmol/L (ref 0.0–2.0)
BICARBONATE: 23.9 mmol/L (ref 20.0–28.0)
Drawn by: 213381
FIO2: 21
O2 SAT: 94 %
PATIENT TEMPERATURE: 98.1
PO2 ART: 68.2 mmHg — AB (ref 83.0–108.0)
pCO2 arterial: 34.8 mmHg (ref 32.0–48.0)
pH, Arterial: 7.449 (ref 7.350–7.450)

## 2017-07-29 LAB — GLUCOSE, CAPILLARY
Glucose-Capillary: 148 mg/dL — ABNORMAL HIGH (ref 65–99)
Glucose-Capillary: 101 mg/dL — ABNORMAL HIGH (ref 65–99)
Glucose-Capillary: 123 mg/dL — ABNORMAL HIGH (ref 65–99)
Glucose-Capillary: 123 mg/dL — ABNORMAL HIGH (ref 65–99)

## 2017-07-29 LAB — HEPARIN LEVEL (UNFRACTIONATED)
HEPARIN UNFRACTIONATED: 0.36 [IU]/mL (ref 0.30–0.70)
HEPARIN UNFRACTIONATED: 0.35 [IU]/mL (ref 0.30–0.70)

## 2017-07-29 LAB — PROTIME-INR
INR: 1.13
Prothrombin Time: 14.4 seconds (ref 11.4–15.2)

## 2017-07-29 LAB — HEMOGLOBIN A1C
Hgb A1c MFr Bld: 7.1 % — ABNORMAL HIGH (ref 4.8–5.6)
MEAN PLASMA GLUCOSE: 157.07 mg/dL

## 2017-07-29 LAB — MRSA PCR SCREENING: MRSA by PCR: NEGATIVE

## 2017-07-29 LAB — ABO/RH: ABO/RH(D): A POS

## 2017-07-29 LAB — ECHOCARDIOGRAM COMPLETE
Height: 60 in
Weight: 2976 oz

## 2017-07-29 LAB — TROPONIN I: Troponin I: 3.97 ng/mL (ref <0.03)

## 2017-07-29 SURGERY — LEFT HEART CATH AND CORONARY ANGIOGRAPHY
Anesthesia: LOCAL

## 2017-07-29 MED ORDER — HEPARIN SODIUM (PORCINE) 1000 UNIT/ML IJ SOLN
INTRAMUSCULAR | Status: AC
  Filled 2017-07-29: qty 1

## 2017-07-29 MED ORDER — MIDAZOLAM HCL 2 MG/2ML IJ SOLN
INTRAMUSCULAR | Status: DC | PRN
  Administered 2017-07-29: 1 mg via INTRAVENOUS

## 2017-07-29 MED ORDER — SODIUM CHLORIDE 0.9 % WEIGHT BASED INFUSION
1.0000 mL/kg/h | INTRAVENOUS | Status: AC
  Administered 2017-07-29: 1 mL/kg/h via INTRAVENOUS

## 2017-07-29 MED ORDER — VERAPAMIL HCL 2.5 MG/ML IV SOLN
INTRAVENOUS | Status: AC
  Filled 2017-07-29: qty 2

## 2017-07-29 MED ORDER — IOPAMIDOL (ISOVUE-370) INJECTION 76%
INTRAVENOUS | Status: DC | PRN
  Administered 2017-07-29: 80 mL via INTRA_ARTERIAL

## 2017-07-29 MED ORDER — NITROGLYCERIN IN D5W 200-5 MCG/ML-% IV SOLN
2.0000 ug/min | INTRAVENOUS | Status: DC
  Filled 2017-07-29: qty 250

## 2017-07-29 MED ORDER — POTASSIUM CHLORIDE 2 MEQ/ML IV SOLN
80.0000 meq | INTRAVENOUS | Status: DC
  Filled 2017-07-29: qty 40

## 2017-07-29 MED ORDER — MAGNESIUM SULFATE 50 % IJ SOLN
40.0000 meq | INTRAMUSCULAR | Status: DC
  Filled 2017-07-29: qty 10

## 2017-07-29 MED ORDER — TRANEXAMIC ACID (OHS) PUMP PRIME SOLUTION
2.0000 mg/kg | INTRAVENOUS | Status: DC
  Filled 2017-07-29: qty 1.69

## 2017-07-29 MED ORDER — INSULIN ASPART 100 UNIT/ML ~~LOC~~ SOLN
0.0000 [IU] | Freq: Three times a day (TID) | SUBCUTANEOUS | Status: DC
  Administered 2017-07-29: 2 [IU] via SUBCUTANEOUS

## 2017-07-29 MED ORDER — DEXMEDETOMIDINE HCL IN NACL 400 MCG/100ML IV SOLN
0.1000 ug/kg/h | INTRAVENOUS | Status: AC
  Administered 2017-07-30: 0.7 ug/kg/h via INTRAVENOUS
  Filled 2017-07-29: qty 100

## 2017-07-29 MED ORDER — SODIUM CHLORIDE 0.9 % IV SOLN
1.5000 mg/kg/h | INTRAVENOUS | Status: AC
  Administered 2017-07-30: 1.5 mg/kg/h via INTRAVENOUS
  Filled 2017-07-29: qty 25

## 2017-07-29 MED ORDER — SODIUM CHLORIDE 0.9 % WEIGHT BASED INFUSION: 3.0000 mL/kg/h | INTRAVENOUS | Status: DC

## 2017-07-29 MED ORDER — INFLUENZA VAC SPLIT QUAD 0.5 ML IM SUSY: 0.5000 mL | PREFILLED_SYRINGE | INTRAMUSCULAR | Status: DC

## 2017-07-29 MED ORDER — SODIUM CHLORIDE 0.9 % IV SOLN
INTRAVENOUS | Status: DC
  Filled 2017-07-29: qty 30

## 2017-07-29 MED ORDER — HEPARIN (PORCINE) IN NACL 100-0.45 UNIT/ML-% IJ SOLN
950.0000 [IU]/h | INTRAMUSCULAR | Status: DC
  Administered 2017-07-29: 800 [IU]/h via INTRAVENOUS
  Filled 2017-07-29: qty 250

## 2017-07-29 MED ORDER — VERAPAMIL HCL 2.5 MG/ML IV SOLN
INTRAVENOUS | Status: DC | PRN
  Administered 2017-07-29: 10 mL via INTRA_ARTERIAL

## 2017-07-29 MED ORDER — HEPARIN (PORCINE) IN NACL 2-0.9 UNIT/ML-% IJ SOLN
INTRAMUSCULAR | Status: AC
  Filled 2017-07-29: qty 1000

## 2017-07-29 MED ORDER — FENTANYL CITRATE (PF) 100 MCG/2ML IJ SOLN
INTRAMUSCULAR | Status: AC
  Filled 2017-07-29: qty 2

## 2017-07-29 MED ORDER — TRANEXAMIC ACID (OHS) BOLUS VIA INFUSION
15.0000 mg/kg | INTRAVENOUS | Status: AC
  Administered 2017-07-30: 1266 mg via INTRAVENOUS
  Filled 2017-07-29: qty 1266

## 2017-07-29 MED ORDER — SODIUM CHLORIDE 0.9% FLUSH: 3.0000 mL | INTRAVENOUS | Status: DC | PRN

## 2017-07-29 MED ORDER — ASPIRIN 81 MG PO CHEW: 81.0000 mg | CHEWABLE_TABLET | ORAL | Status: DC

## 2017-07-29 MED ORDER — SODIUM CHLORIDE 0.9% FLUSH: 3.0000 mL | Freq: Two times a day (BID) | INTRAVENOUS | Status: DC

## 2017-07-29 MED ORDER — SODIUM CHLORIDE 0.9 % IV SOLN: 250.0000 mL | INTRAVENOUS | Status: DC | PRN

## 2017-07-29 MED ORDER — DOPAMINE-DEXTROSE 3.2-5 MG/ML-% IV SOLN
0.0000 ug/kg/min | INTRAVENOUS | Status: DC
  Filled 2017-07-29: qty 250

## 2017-07-29 MED ORDER — SODIUM CHLORIDE 0.9 % IV SOLN
30.0000 ug/min | INTRAVENOUS | Status: AC
  Administered 2017-07-30: 25 ug/min via INTRAVENOUS
  Filled 2017-07-29: qty 2

## 2017-07-29 MED ORDER — DIAZEPAM 2 MG PO TABS
2.0000 mg | ORAL_TABLET | Freq: Once | ORAL | Status: AC
  Administered 2017-07-30: 2 mg via ORAL
  Filled 2017-07-29: qty 1

## 2017-07-29 MED ORDER — SODIUM CHLORIDE 0.9 % IV SOLN
INTRAVENOUS | Status: AC
  Administered 2017-07-30: 1 [IU]/h via INTRAVENOUS
  Filled 2017-07-29: qty 1

## 2017-07-29 MED ORDER — CHLORHEXIDINE GLUCONATE CLOTH 2 % EX PADS
6.0000 | MEDICATED_PAD | Freq: Once | CUTANEOUS | Status: AC
  Administered 2017-07-29: 6 via TOPICAL

## 2017-07-29 MED ORDER — CHLORHEXIDINE GLUCONATE 0.12 % MT SOLN
15.0000 mL | Freq: Once | OROMUCOSAL | Status: AC
  Administered 2017-07-30: 15 mL via OROMUCOSAL
  Filled 2017-07-29: qty 15

## 2017-07-29 MED ORDER — TEMAZEPAM 15 MG PO CAPS: 15.0000 mg | ORAL_CAPSULE | Freq: Once | ORAL | Status: DC | PRN

## 2017-07-29 MED ORDER — HEPARIN SODIUM (PORCINE) 1000 UNIT/ML IJ SOLN
INTRAMUSCULAR | Status: DC | PRN
  Administered 2017-07-29: 4500 [IU] via INTRAVENOUS

## 2017-07-29 MED ORDER — LIDOCAINE HCL 2 % IJ SOLN
INTRAMUSCULAR | Status: AC
  Filled 2017-07-29: qty 10

## 2017-07-29 MED ORDER — PLASMA-LYTE 148 IV SOLN
INTRAVENOUS | Status: AC
  Administered 2017-07-30: 500 mL
  Filled 2017-07-29: qty 2.5

## 2017-07-29 MED ORDER — IOPAMIDOL (ISOVUE-370) INJECTION 76%
INTRAVENOUS | Status: AC
  Filled 2017-07-29: qty 100

## 2017-07-29 MED ORDER — FENTANYL CITRATE (PF) 100 MCG/2ML IJ SOLN
INTRAMUSCULAR | Status: DC | PRN
  Administered 2017-07-29: 25 ug via INTRAVENOUS

## 2017-07-29 MED ORDER — SODIUM CHLORIDE 0.9 % WEIGHT BASED INFUSION: 1.0000 mL/kg/h | INTRAVENOUS | Status: DC

## 2017-07-29 MED ORDER — SODIUM CHLORIDE 0.9 % IV SOLN
1500.0000 mg | INTRAVENOUS | Status: AC
  Administered 2017-07-30: 1500 mg via INTRAVENOUS
  Filled 2017-07-29: qty 1500

## 2017-07-29 MED ORDER — HEPARIN (PORCINE) IN NACL 2-0.9 UNIT/ML-% IJ SOLN
INTRAMUSCULAR | Status: AC | PRN
  Administered 2017-07-29: 1000 mL

## 2017-07-29 MED ORDER — EPINEPHRINE PF 1 MG/ML IJ SOLN
0.0000 ug/min | INTRAVENOUS | Status: DC
  Filled 2017-07-29: qty 4

## 2017-07-29 MED ORDER — MIDAZOLAM HCL 2 MG/2ML IJ SOLN
INTRAMUSCULAR | Status: AC
  Filled 2017-07-29: qty 2

## 2017-07-29 MED ORDER — CHLORHEXIDINE GLUCONATE CLOTH 2 % EX PADS: 6.0000 | MEDICATED_PAD | Freq: Once | CUTANEOUS | Status: AC

## 2017-07-29 MED ORDER — LIDOCAINE HCL 2 % IJ SOLN
INTRAMUSCULAR | Status: DC | PRN
  Administered 2017-07-29: 2 mL

## 2017-07-29 MED ORDER — LEVOFLOXACIN IN D5W 500 MG/100ML IV SOLN
500.0000 mg | INTRAVENOUS | Status: AC
  Administered 2017-07-30: 500 mg via INTRAVENOUS
  Filled 2017-07-29: qty 100

## 2017-07-29 MED ORDER — BISACODYL 5 MG PO TBEC
5.0000 mg | DELAYED_RELEASE_TABLET | Freq: Once | ORAL | Status: AC
  Administered 2017-07-29: 5 mg via ORAL
  Filled 2017-07-29: qty 1

## 2017-07-29 MED ORDER — SODIUM CHLORIDE 0.9% FLUSH
3.0000 mL | Freq: Two times a day (BID) | INTRAVENOUS | Status: DC
  Administered 2017-07-29: 3 mL via INTRAVENOUS

## 2017-07-29 MED ORDER — METOPROLOL TARTRATE 12.5 MG HALF TABLET
12.5000 mg | ORAL_TABLET | Freq: Once | ORAL | Status: AC
  Administered 2017-07-30: 12.5 mg via ORAL
  Filled 2017-07-29: qty 1

## 2017-07-29 SURGICAL SUPPLY — 12 items
CATH IMPULSE 5F ANG/FL3.5 (CATHETERS) ×2 IMPLANT
COVER PRB 48X5XTLSCP FOLD TPE (BAG) ×1 IMPLANT
COVER PROBE 5X48 (BAG) ×1
DEVICE RAD COMP TR BAND LRG (VASCULAR PRODUCTS) ×2 IMPLANT
GLIDESHEATH SLEND SS 6F .021 (SHEATH) ×2 IMPLANT
GUIDEWIRE INQWIRE 1.5J.035X260 (WIRE) ×1 IMPLANT
INQWIRE 1.5J .035X260CM (WIRE) ×2
KIT HEART LEFT (KITS) ×2 IMPLANT
PACK CARDIAC CATHETERIZATION (CUSTOM PROCEDURE TRAY) ×2 IMPLANT
SYR MEDRAD MARK V 150ML (SYRINGE) ×2 IMPLANT
TRANSDUCER W/STOPCOCK (MISCELLANEOUS) ×2 IMPLANT
TUBING CIL FLEX 10 FLL-RA (TUBING) ×2 IMPLANT

## 2017-07-29 NOTE — Consult Note (Signed)
Reason for Consult:3 vessel CAD s/p non STEMI Referring Physician: Dr. Martinique  [name] [name] is an 51 y.o. female.  HPI: 51 yo woman who presents with a cc/o CP radiating to left arm.  [name] [name] is a 51 yo woman with a past history of type II diabetes without complication, hypertension, hyperlipidemia and obesity. She is a life long nonsmoker. She does have a family history of CAD. She was in her usual state of health until about 3 weeks ago when she began experiencing a pressure in her chest and tingling in her left arm. Pain was exacerbated by exertion. On the day of admission she had an episode after walking up a flight of stairs. Her family convinced her to go to her primary, Dr. Felipa Eth. He sent her to the ED. Her initial troponin was elevated at 3.86. ECG showed subtle ST depression. She was admitted with a non STEMI. She is currently pain free.   At catheterization this morning she was found to have 3 vessel CAD with severe disease in the RCA, circumflex and a diagonal branch. The proximal LAD was spared but she had severe disease distally. EF was ~ 60%  Past Medical History:  Diagnosis Date  . Diabetes mellitus without complication ([place])   . Hyperlipidemia   . Hypertension   . Obesity     Past Surgical History:  Procedure Laterality Date  . CESAREAN SECTION    . PARTIAL HYSTERECTOMY  2005    Family History  Problem Relation Age of Onset  . Hypertension Other     Social History:  reports that she has never smoked. She has never used smokeless tobacco. She reports that she drinks alcohol. She reports that she does not use drugs.  Allergies:  Allergies  Allergen Reactions  . Penicillins Hives    Medications:  Scheduled: . amLODipine  10 mg Oral Daily  . aspirin EC  81 mg Oral Daily  . atorvastatin  80 mg Oral q1800  . Influenza vac split quadrivalent PF  0.5 mL Intramuscular Tomorrow-1000  . insulin aspart  0-15 Units Subcutaneous TID WC  . losartan  50 mg  Oral Daily  . metoprolol tartrate  12.5 mg Oral BID  . sodium chloride flush  3 mL Intravenous Q12H    Results for orders placed or performed during the hospital encounter of 07/28/17 (from the past 48 hour(s))  Basic metabolic panel     Status: Abnormal   Collection Time: 07/28/17  5:34 PM  Result Value Ref Range   Sodium 138 135 - 145 mmol/L   Potassium 3.8 3.5 - 5.1 mmol/L   Chloride 105 101 - 111 mmol/L   CO2 22 22 - 32 mmol/L   Glucose, Bld 116 (H) 65 - 99 mg/dL   BUN 23 (H) 6 - 20 mg/dL   Creatinine, Ser 0.84 0.44 - 1.00 mg/dL   Calcium 10.8 (H) 8.9 - 10.3 mg/dL   GFR calc non Af Amer >60 >60 mL/min   GFR calc Af Amer >60 >60 mL/min    Comment: (NOTE) The eGFR has been calculated using the CKD EPI equation. This calculation has not been validated in all clinical situations. eGFR's persistently <60 mL/min signify possible Chronic Kidney Disease.    Anion gap 11 5 - 15  CBC     Status: Abnormal   Collection Time: 07/28/17  5:34 PM  Result Value Ref Range   WBC 12.6 (H) 4.0 - 10.5 K/uL   RBC 5.11 3.87 -  5.11 MIL/uL   Hemoglobin 12.9 12.0 - 15.0 g/dL   HCT 40.4 36.0 - 46.0 %   MCV 79.1 78.0 - 100.0 fL   MCH 25.2 (L) 26.0 - 34.0 pg   MCHC 31.9 30.0 - 36.0 g/dL   RDW 14.4 11.5 - 15.5 %   Platelets 348 150 - 400 K/uL  I-stat troponin, ED     Status: Abnormal   Collection Time: 07/28/17  6:01 PM  Result Value Ref Range   Troponin i, poc 3.86 (HH) 0.00 - 0.08 ng/mL   Comment NOTIFIED PHYSICIAN    Comment 3            Comment: Due to the release kinetics of cTnI, a negative result within the first hours of the onset of symptoms does not rule out myocardial infarction with certainty. If myocardial infarction is still suspected, repeat the test at appropriate intervals.   I-Stat beta hCG blood, ED     Status: None   Collection Time: 07/28/17  7:19 PM  Result Value Ref Range   I-stat hCG, quantitative <5.0 <5 mIU/mL   Comment 3            Comment:   GEST. AGE       CONC.  (mIU/mL)   <=1 WEEK        5 - 50     2 WEEKS       50 - 500     3 WEEKS       100 - 10,000     4 WEEKS     1,000 - 30,000        FEMALE AND NON-PREGNANT FEMALE:     LESS THAN 5 mIU/mL   Protime-INR     Status: None   Collection Time: 07/28/17  7:45 PM  Result Value Ref Range   Prothrombin Time 13.2 11.4 - 15.2 seconds   INR 1.01   APTT     Status: Abnormal   Collection Time: 07/28/17  7:45 PM  Result Value Ref Range   aPTT 37 (H) 24 - 36 seconds    Comment:        IF BASELINE aPTT IS ELEVATED, SUGGEST PATIENT RISK ASSESSMENT BE USED TO DETERMINE APPROPRIATE ANTICOAGULANT THERAPY.   Troponin I     Status: Abnormal   Collection Time: 07/28/17  7:45 PM  Result Value Ref Range   Troponin I 4.24 (HH) <0.03 ng/mL    Comment: CRITICAL RESULT CALLED TO, READ BACK BY AND VERIFIED WITH: BAIN C,RN 07/28/17 2110 WAYK   Lipid panel     Status: Abnormal   Collection Time: 07/28/17  7:45 PM  Result Value Ref Range   Cholesterol 237 (H) 0 - 200 mg/dL   Triglycerides 169 (H) <150 mg/dL   HDL 36 (L) >40 mg/dL   Total CHOL/HDL Ratio 6.6 RATIO   VLDL 34 0 - 40 mg/dL   LDL Cholesterol 167 (H) 0 - 99 mg/dL    Comment:        Total Cholesterol/HDL:CHD Risk Coronary Heart Disease Risk Table                     Men   Women  1/2 Average Risk   3.4   3.3  Average Risk       5.0   4.4  2 X Average Risk   9.6   7.1  3 X Average Risk  23.4   11.0  Use the calculated Patient Ratio above and the CHD Risk Table to determine the patient's CHD Risk.        ATP III CLASSIFICATION (LDL):  <100     mg/dL   Optimal  100-129  mg/dL   Near or Above                    Optimal  130-159  mg/dL   Borderline  160-189  mg/dL   High  >190     mg/dL   Very High   Urinalysis, Routine w reflex microscopic     Status: Abnormal   Collection Time: 07/28/17  9:00 PM  Result Value Ref Range   Color, Urine YELLOW YELLOW   APPearance CLEAR CLEAR   Specific Gravity, Urine 1.018 1.005 - 1.030   pH  5.0 5.0 - 8.0   Glucose, UA NEGATIVE NEGATIVE mg/dL   Hgb urine dipstick NEGATIVE NEGATIVE   Bilirubin Urine NEGATIVE NEGATIVE   Ketones, ur 5 (A) NEGATIVE mg/dL   Protein, ur NEGATIVE NEGATIVE mg/dL   Nitrite NEGATIVE NEGATIVE   Leukocytes, UA MODERATE (A) NEGATIVE   RBC / HPF 0-5 0 - 5 RBC/hpf   WBC, UA 6-30 0 - 5 WBC/hpf   Bacteria, UA RARE (A) NONE SEEN   Squamous Epithelial / LPF 0-5 (A) NONE SEEN   Mucus PRESENT    Hyaline Casts, UA PRESENT    Uric Acid Crys, UA PRESENT   Troponin I     Status: Abnormal   Collection Time: 07/28/17 10:10 PM  Result Value Ref Range   Troponin I 4.45 (HH) <0.03 ng/mL    Comment: CRITICAL VALUE NOTED.  VALUE IS CONSISTENT WITH PREVIOUSLY REPORTED AND CALLED VALUE.  MRSA PCR Screening     Status: None   Collection Time: 07/28/17 11:43 PM  Result Value Ref Range   MRSA by PCR NEGATIVE NEGATIVE    Comment:        The GeneXpert MRSA Assay (FDA approved for NASAL specimens only), is one component of a comprehensive MRSA colonization surveillance program. It is not intended to diagnose MRSA infection nor to guide or monitor treatment for MRSA infections.   Heparin level (unfractionated)     Status: None   Collection Time: 07/29/17  2:22 AM  Result Value Ref Range   Heparin Unfractionated 0.36 0.30 - 0.70 IU/mL    Comment:        IF HEPARIN RESULTS ARE BELOW EXPECTED VALUES, AND PATIENT DOSAGE HAS BEEN CONFIRMED, SUGGEST FOLLOW UP TESTING OF ANTITHROMBIN III LEVELS.   Basic metabolic panel     Status: Abnormal   Collection Time: 07/29/17  2:22 AM  Result Value Ref Range   Sodium 136 135 - 145 mmol/L   Potassium 3.5 3.5 - 5.1 mmol/L   Chloride 106 101 - 111 mmol/L   CO2 24 22 - 32 mmol/L   Glucose, Bld 194 (H) 65 - 99 mg/dL   BUN 17 6 - 20 mg/dL   Creatinine, Ser 0.78 0.44 - 1.00 mg/dL   Calcium 9.9 8.9 - 10.3 mg/dL   GFR calc non Af Amer >60 >60 mL/min   GFR calc Af Amer >60 >60 mL/min    Comment: (NOTE) The eGFR has been  calculated using the CKD EPI equation. This calculation has not been validated in all clinical situations. eGFR's persistently <60 mL/min signify possible Chronic Kidney Disease.    Anion gap 6 5 - 15  CBC     Status: Abnormal  Collection Time: 07/29/17  2:22 AM  Result Value Ref Range   WBC 11.3 (H) 4.0 - 10.5 K/uL   RBC 4.61 3.87 - 5.11 MIL/uL   Hemoglobin 11.7 (L) 12.0 - 15.0 g/dL   HCT 36.4 36.0 - 46.0 %   MCV 79.0 78.0 - 100.0 fL   MCH 25.4 (L) 26.0 - 34.0 pg   MCHC 32.1 30.0 - 36.0 g/dL   RDW 14.2 11.5 - 15.5 %   Platelets 298 150 - 400 K/uL  Protime-INR     Status: None   Collection Time: 07/29/17  2:22 AM  Result Value Ref Range   Prothrombin Time 14.4 11.4 - 15.2 seconds   INR 1.13   Troponin I     Status: Abnormal   Collection Time: 07/29/17  2:22 AM  Result Value Ref Range   Troponin I 3.97 (HH) <0.03 ng/mL    Comment: CRITICAL VALUE NOTED.  VALUE IS CONSISTENT WITH PREVIOUSLY REPORTED AND CALLED VALUE.  Heparin level (unfractionated)     Status: None   Collection Time: 07/29/17  8:05 AM  Result Value Ref Range   Heparin Unfractionated 0.35 0.30 - 0.70 IU/mL    Comment:        IF HEPARIN RESULTS ARE BELOW EXPECTED VALUES, AND PATIENT DOSAGE HAS BEEN CONFIRMED, SUGGEST FOLLOW UP TESTING OF ANTITHROMBIN III LEVELS.   Glucose, capillary     Status: Abnormal   Collection Time: 07/29/17 11:28 AM  Result Value Ref Range   Glucose-Capillary 148 (H) 65 - 99 mg/dL   Comment 1 Notify RN     Dg Chest 2 View  Result Date: 07/28/2017 CLINICAL DATA:  Midsternal chest pain extending down the left upper extremity, several days duration. EXAM: CHEST  2 VIEW COMPARISON:  05/14/2004. FINDINGS: Unchanged borderline cardiomegaly. The lungs are clear. The pulmonary vasculature is normal. There is no pleural effusion. Hilar and mediastinal contours are unremarkable and unchanged. IMPRESSION: Stable borderline cardiomegaly.  No consolidation or effusion. Electronically Signed    By: Andreas Newport M.D.   On: 07/28/2017 18:22    Review of Systems  Constitutional: Positive for malaise/fatigue. Negative for chills and fever.  HENT: Negative for hearing loss.   Eyes: Negative for blurred vision and double vision.  Respiratory: Positive for shortness of breath.   Cardiovascular: Positive for chest pain and leg swelling. Negative for palpitations and claudication.  Gastrointestinal: Positive for heartburn. Negative for nausea and vomiting.  Genitourinary: Negative for dysuria and urgency.  Musculoskeletal: Negative for joint pain and myalgias.  Neurological: Negative for focal weakness and loss of consciousness.  Endo/Heme/Allergies: Does not bruise/bleed easily.  Psychiatric/Behavioral: Negative for depression. The patient is not nervous/anxious.   All other systems reviewed and are negative.  Blood pressure 123/88, pulse 98, temperature 98.5 F (36.9 C), temperature source Axillary, resp. rate 16, height 5' (1.524 m), weight 186 lb (84.4 kg), SpO2 92 %. Physical Exam  Vitals reviewed. Constitutional: She is oriented to person, place, and time. No distress.  obese  HENT:  Head: Normocephalic and atraumatic.  Eyes: Conjunctivae and EOM are normal. No scleral icterus.  Neck: Neck supple. No thyromegaly present.  No carotid bruit  Cardiovascular: Normal rate, regular rhythm, normal heart sounds and intact distal pulses.  Exam reveals no gallop and no friction rub.   No murmur heard. Normal Allen's test on left  Respiratory: Effort normal and breath sounds normal. No respiratory distress. She has no wheezes. She has no rales.  GI: She exhibits no distension.  There is no tenderness.  Musculoskeletal: She exhibits no edema or deformity.  Lymphadenopathy:    She has no cervical adenopathy.  Neurological: She is alert and oriented to person, place, and time. No cranial nerve deficit. She exhibits normal muscle tone. Coordination normal.  Skin: Skin is warm and  dry.   Cardiac Catheterization Conclusion     1st Diag lesion, 70 %stenosed.  2nd Diag lesion, 90 %stenosed.  Dist LAD lesion, 95 %stenosed.  1st Mrg lesion, 90 %stenosed.  Ost 2nd Mrg to 2nd Mrg lesion, 100 %stenosed.  Mid Cx to Dist Cx lesion, 80 %stenosed.  Mid RCA lesion, 75 %stenosed.  Dist RCA lesion, 95 %stenosed.  The left ventricular systolic function is normal.  LV end diastolic pressure is mildly elevated.  The left ventricular ejection fraction is 55-65% by visual estimate.   1. Severe 3 vessel obstructive CAD 2. Good LV function 3. Mildly elevated LVEDP  Plan: I reviewed angiograms with Dr. Gwenlyn Found. She has severe multivessel CAD with high Syntax score. There is sparing of the proximal to mid LAD. The culprit appears to the the second OM. Overall LV function is good. The question is whether she would be best managed with an aggressive PCI approach versus CABG. The first and second OMs, distal LCx, mid and distal RCA could be treated percutaneously but would require extensive stenting. I think a complete discussion with a multidisciplinary approach is needed. Will consult CT surgery. Continue aggressive medical therapy until decision made concerning revascularization.    I personally reviewed the cath images and concur with the findings noted above  Assessment/Plan: 51 yo woman with multiple CRF including type II diabetes, HTN and hyperlipidemia presents with an unstable coronary syndrome and ruled in for a NSTEMI. At cath has severe 3 vessel CAD with preserved LV function.  She has relatively small vessels and diabetes and would require multiple stents for PCI. I think her best option is CABG. I discussed this with the patient, her husband and Dr. Martinique.  I think the best option would be to place the RIMA to the distal RCA, a radial to the OM branches and a saphenous vein to the diagonal. The LAD is diseased beyond where it would be graftable. She has a normal  Allen's test on the left. Cannot examine the right radial at present since she was cathed through that vessel.  I discussed the general nature of the procedure, the need for general anesthesia, the use of cardiopulmonary bypass, and the incisions to be used with [name] [name] and her huband. We discussed the expected hospital stay, overall recovery and short and long term outcomes. I informed them of the indications, risks, benefits and alternatives. They understand the risks include, but are not limited to death, stroke, MI, DVT/PE, bleeding, possible need for transfusion, infections, cardiac arrhythmias, as well as other organ system dysfunction including respiratory, renal, or GI complications.   She wishes to think about the operation before deciding whether to proceed. Will go ahead and place on OR schedule to reserve a spot.  07/29/2017, 1:00 PM   Revonda Standard. Roxan Hockey, MD [facility] [phone]

## 2017-07-29 NOTE — Progress Notes (Signed)
Pre-op Cardiac Surgery  Carotid Findings:  No evidence of extracranial ICA stenosis. Vertebral artery flow is antegrade.  Upper Extremity Right Left  Brachial Pressures 130 Triphasic 130 Triphasic  Radial Waveforms Triphasic Triohasic  Ulnar Waveforms Triphasic Triphasic  Palmar Arch (Allen's Test) Normal Normal   Findings: Doppler waveforms remained normal bilaterally with both radial and ulnar compressions     Lower  Extremity Right Left  Dorsalis Pedis 143 Triphasic 146 Triphasic  Posterior Tibial 148 Triphasic 153 Triphasic  Ankle/Brachial Indices 1.14 1.18    Findings:  ABIs and Doppler waveforms indicate normal arterial flow bilaterally at rest.  Toma Deiters, RVS 07/29/2017, 4:40 pm

## 2017-07-29 NOTE — Progress Notes (Signed)
Paged Dr Allena Katz regarding patient CP described as pressure over mid to L chest rated 4 out of 10. Pain relieved with 1 nitro. See Vs and emar for charting.

## 2017-07-29 NOTE — Progress Notes (Signed)
ANTICOAGULATION CONSULT NOTE - Follow Up Consult  Pharmacy Consult for heparin Indication: NSTEMI  Labs:  Recent Labs  07/28/17 1734 07/28/17 1945 07/28/17 2210 07/29/17 0222  HGB 12.9  --   --  11.7*  HCT 40.4  --   --  36.4  PLT 348  --   --  298  APTT  --  37*  --   --   LABPROT  --  13.2  --  14.4  INR  --  1.01  --  1.13  HEPARINUNFRC  --   --   --  0.36  CREATININE 0.84  --   --   --   TROPONINI  --  4.24* 4.45*  --     Assessment/Plan:  51yo female therapeutic on heparin with initial dosing for NSTEMI. Will continue gtt at current rate and confirm stable with additional level.   Vernard Gambles, PharmD, BCPS  07/29/2017,3:04 AM

## 2017-07-29 NOTE — Anesthesia Preprocedure Evaluation (Addendum)
Anesthesia Evaluation  Patient identified by MRN, date of birth, ID band Patient awake    Reviewed: Allergy & Precautions, NPO status , Patient's Chart, lab work & pertinent test results, reviewed documented beta blocker date and time   Airway Mallampati: II  TM Distance: >3 FB Neck ROM: Full    Dental no notable dental hx.    Pulmonary neg pulmonary ROS,    Pulmonary exam normal breath sounds clear to auscultation       Cardiovascular hypertension, Pt. on medications + CAD and + Past MI  Normal cardiovascular exam Rhythm:Regular Rate:Normal  ECG: ST, rate 110  1st Diag lesion, 70 %stenosed. 2nd Diag lesion, 90 %stenosed. Dist LAD lesion, 95 %stenosed. 1st Mrg lesion, 90 %stenosed. Ost 2nd Mrg to 2nd Mrg lesion, 100 %stenosed. Mid Cx to Dist Cx lesion, 80 %stenosed. Mid RCA lesion, 75 %stenosed. Dist RCA lesion, 95 %stenosed. The left ventricular systolic function is normal. LV end diastolic pressure is mildly elevated. The left ventricular ejection fraction is 55-65% by visual estimate.   1. Severe 3 vessel obstructive CAD 2. Good LV function 3. Mildly elevated LVEDP  Left ventricle: The cavity size was normal. Wall thickness was normal. Systolic function was normal. The estimated ejection fraction was in the range of 60% to 65%. The study is not technically sufficient to allow evaluation of LV diastolic function. Left atrium: The atrium was normal in size. Inferior vena cava: The vessel was normal in size. The  respirophasic diameter changes were in the normal range (>= 50%), consistent with normal central venous pressure.  LVEF 60-65%, normal wall thickness, normal wall motion, indeterminate diastolic function, normal LA size, normal IVC.    Neuro/Psych negative neurological ROS  negative psych ROS   GI/Hepatic negative GI ROS, Neg liver ROS,   Endo/Other  diabetes, Oral Hypoglycemic Agents   Renal/GU negative Renal ROS     Musculoskeletal negative musculoskeletal ROS (+)   Abdominal (+) + obese,   Peds  Hematology negative hematology ROS (+)   Anesthesia Other Findings HLD  Reproductive/Obstetrics                            Anesthesia Physical Anesthesia Plan  ASA: IV  Anesthesia Plan: General   Post-op Pain Management:    Induction: Intravenous  PONV Risk Score and Plan: 3 and Ondansetron, Dexamethasone and Midazolam  Airway Management Planned: Oral ETT  Additional Equipment: Arterial line, CVP, PA Cath, TEE and Ultrasound Guidance Line Placement  Intra-op Plan:   Post-operative Plan: Post-operative intubation/ventilation  Informed Consent: I have reviewed the patients History and Physical, chart, labs and discussed the procedure including the risks, benefits and alternatives for the proposed anesthesia with the patient or authorized representative who has indicated his/her understanding and acceptance.   Dental advisory given  Plan Discussed with: CRNA  Anesthesia Plan Comments: (Right radial arterial line)        Anesthesia Quick Evaluation

## 2017-07-29 NOTE — H&P (View-Only) (Signed)
Progress Note  Patient Name: [name] [name] Date of Encounter: 07/29/2017  Primary Cardiologist: New  Subjective   No chest pain this morning.   Inpatient Medications    Scheduled Meds: . amLODipine  10 mg Oral Daily  . aspirin EC  81 mg Oral Daily  . atorvastatin  80 mg Oral q1800  . Influenza vac split quadrivalent PF  0.5 mL Intramuscular Tomorrow-1000  . losartan  50 mg Oral Daily  . metoprolol tartrate  12.5 mg Oral BID   Continuous Infusions: . sodium chloride 20 mL/hr at 07/29/17 0537  . heparin 800 Units/hr (07/28/17 2058)   PRN Meds: acetaminophen, nitroGLYCERIN, ondansetron (ZOFRAN) IV, polyvinyl alcohol   Vital Signs    Vitals:   07/29/17 0342 07/29/17 0448 07/29/17 0454 07/29/17 0456  BP: 113/85 126/87  120/78  Pulse: 91 93 100 (!) 101  Resp: (!) 23 (!) Temp: 98.5 F (36.9 C)     TempSrc: Oral     SpO2: 96% 95% 90% (!) 88%  Weight: 186 lb (84.4 kg)     Height:        Intake/Output Summary (Last 24 hours) at 07/29/17 0725 Last data filed at 07/29/17 0537  Gross per 24 hour  Intake           266.29 ml  Output              100 ml  Net           166.29 ml   Filed Weights   07/28/17 1726 07/28/17 1900 07/29/17 0342  Weight: 187 lb (84.8 kg) 186 lb (84.4 kg) 186 lb (84.4 kg)    Telemetry    SR - Personally Reviewed  ECG    ST - Personally Reviewed  Physical Exam   General: Well developed, well nourished, female appearing in no acute distress. Head: Normocephalic, atraumatic.  Neck: Supple without bruits, JVD. Lungs:  Resp regular and unlabored, CTA. Heart: RRR, S1, S2, no S3, S4, or murmur; no rub. Abdomen: Soft, non-tender, non-distended with normoactive bowel sounds. No hepatomegaly. No rebound/guarding. No obvious abdominal masses. Extremities: No clubbing, cyanosis, edema. Distal pedal pulses are 2+ bilaterally. Neuro: Alert and oriented X 3. Moves all extremities spontaneously. Psych: Normal affect.  Labs      Chemistry Recent Labs Lab 07/28/17 1734 07/29/17 0222  NA 138 136  K 3.8 3.5  CL 105 106  CO2 22 24  GLUCOSE 116* 194*  BUN 23* 17  CREATININE 0.84 0.78  CALCIUM 10.8* 9.9  GFRNONAA >60 >60  GFRAA >60 >60  ANIONGAP 11 6     Hematology Recent Labs Lab 07/28/17 1734 07/29/17 0222  WBC 12.6* 11.3*  RBC 5.11 4.61  HGB 12.9 11.7*  HCT 40.4 36.4  MCV 79.1 79.0  MCH 25.2* 25.4*  MCHC 31.9 32.1  RDW 14.4 14.2  PLT 348 298    Cardiac Enzymes Recent Labs Lab 07/28/17 1945 07/28/17 2210 07/29/17 0222  TROPONINI 4.24* 4.45* 3.97*    Recent Labs Lab 07/28/17 1801  TROPIPOC 3.86*     BNPNo results for input(s): BNP, PROBNP in the last 168 hours.   DDimer No results for input(s): DDIMER in the last 168 hours.    Radiology    Dg Chest 2 View  Result Date: 07/28/2017 CLINICAL DATA:  Midsternal chest pain extending down the left upper extremity, several days duration. EXAM: CHEST  2 VIEW COMPARISON:  05/14/2004. FINDINGS: Unchanged borderline cardiomegaly. The lungs are  clear. The pulmonary vasculature is normal. There is no pleural effusion. Hilar and mediastinal contours are unremarkable and unchanged. IMPRESSION: Stable borderline cardiomegaly.  No consolidation or effusion. Electronically Signed   By: [name] [name] M.D.   On: 07/28/2017 18:22    Cardiac Studies   N/a  Patient Profile     50 y.o. female with PMH of HTN, DM and HL who presented with chest pain and ruled in with positive troponins.   Assessment & Plan    1. NSTEMI: Trop peaked at 4.45. EKG nonacute. Placed on IV heparin, and started on ASA, BB, and statin replaced with high dose Lipitor. Did have some intermittent episodes of chest pain since admission. Given symptoms, CRFs and labs, this is concerning for ACS. Will plan for cath today. This was discussed with the patient by Dr. Karsin [name].   -- continue IV heparin, BB, statin and ASA  2. HTN: controlled on current regimen  3. DM: Metformin  held while inpatient with plans for cath today.  -- SSI  4. HL: LDL 167 this admission. Home simvastatin replaced with high dose Lipitor.    Signed, [name] Roberts, NP  07/29/2017, 7:25 AM  Pager # [phone]   Agree with note by [name] Roberts NP-C  [name] [name] was admitted overnight with non-STEMI. She has positive cardiac risk factors. Her troponin rose to 4. Her pain began over the weekend and was more intense yesterday. She is on IV heparin and is pain-free. EKG shows no acute changes. Her exam is otherwise benign. Her labs are okay. She'll need diagnostic coronary angiography this morning.The patient understands that risks included but are not limited to stroke (1 in 1000), death (1 in 1000), kidney failure [usually temporary] (1 in 500), bleeding (1 in 200), allergic reaction [possibly serious] (1 in 200). The patient understands and agrees to proceed  [name] [name] [name] [name], M.D., FACP, FACC, FAHA, FSCAI [facility] [address] [place], [place]  [address]  [phone] 07/29/2017 8:07 AM   For questions or updates, please contact [facility] Please consult [email or web address] for contact info under Cardiology/STEMI. Daytime calls, contact the Day Call APP (6a-8a) or assigned team (Teams A-D) provider (7:30a - 5p). All other daytime calls (7:30-5p), contact the Card Master @ [phone].   Nighttime calls, contact the assigned APP (5p-8p) or MD (6:30p-8p). Overnight calls (8p-6a), contact the on call Fellow @ [phone].    

## 2017-07-29 NOTE — Interval H&P Note (Signed)
History and Physical Interval Note:  07/29/2017 9:25 AM  [name] [name]  has presented today for surgery, with the diagnosis of cp  The various methods of treatment have been discussed with the patient and family. After consideration of risks, benefits and other options for treatment, the patient has consented to  Procedure(s): LEFT HEART CATH AND CORONARY ANGIOGRAPHY (N/A) as a surgical intervention .  The patient's history has been reviewed, patient examined, no change in status, stable for surgery.  I have reviewed the patient's chart and labs.  Questions were answered to the patient's satisfaction.   Cath Lab Visit (complete for each Cath Lab visit)  Clinical Evaluation Leading to the Procedure:   ACS: Yes.    Non-ACS:    Anginal Classification: CCS IV  Anti-ischemic medical therapy: Minimal Therapy (1 class of medications)  Non-Invasive Test Results: No non-invasive testing performed  Prior CABG: No previous CABG        Theron Arista [facility] 07/29/2017 9:25 AM

## 2017-07-29 NOTE — Progress Notes (Addendum)
ANTICOAGULATION CONSULT NOTE - Initial Consult  Pharmacy Consult for heparin dosing Indication: chest pain/ACS  Allergies  Allergen Reactions  . Penicillins Hives    Patient Measurements: Height: 5' (152.4 cm) Weight: 186 lb (84.4 kg) IBW/kg (Calculated) : 45.5 Heparin Dosing Weight: 67.15   Vital Signs: Temp: 98 F (36.7 C) (10/11 0819) Temp Source: Oral (10/11 0819) BP: 127/86 (10/11 0819) Pulse Rate: 95 (10/11 0819)  Labs:  Recent Labs  07/28/17 1734 07/28/17 1945 07/28/17 2210 07/29/17 0222 07/29/17 0805  HGB 12.9  --   --  11.7*  --   HCT 40.4  --   --  36.4  --   PLT 348  --   --  298  --   APTT  --  37*  --   --   --   LABPROT  --  13.2  --  14.4  --   INR  --  1.01  --  1.13  --   HEPARINUNFRC  --   --   --  0.36 0.35  CREATININE 0.84  --   --  0.78  --   TROPONINI  --  4.24* 4.45* 3.97*  --     Estimated Creatinine Clearance: 80.2 mL/min (by C-G formula based on SCr of 0.78 mg/dL).   Medical History: Past Medical History:  Diagnosis Date  . Diabetes mellitus without complication (HCC)   . Hyperlipidemia   . Hypertension   . Obesity      Assessment: 22 YOF admitted from PCP with abnormal EKG and labs. Had chest pain on and off over weekend. Pharmacy consulted for heparin infusion for r/o ACS.   First heparin level came back within goal range. Repeat level came back therapeutic at 0.35. CBC remains stable. No issues noted with infusion by nursing. No signs/symptoms of bleeding.   Goal of Therapy:  Heparin level 0.3-0.7 units/ml Monitor platelets by anticoagulation protocol: Yes   Plan:  Continue heparin infusion at 800 units/hr. Monitor daily heparin and CBC Follow up plan for heparin gtt following cath  Girard Cooter, PharmD Clinical Pharmacist  Phone: [phone] 07/29/2017,9:01 AM   ADDENDUM Patient underwent cardiac cath finding severe 3 vessel obstructive CAD. Plan for CT surgery consult. Pharmacy consulted to restart heparin 8  hours after sheath removal. Per procedure log, sheath was removed on 10/11 at 1015.   1) Restart heparin infusion at 800 units/hr at 1815 on 10/11 2) Obtain heparin level 6 hours after heparin restart 3) Monitor daily heparin level, CBC, and s/sx of bleeding 4) Follow up with plans with CT surgery  Girard Cooter, PharmD Clinical Pharmacist

## 2017-07-29 NOTE — Progress Notes (Signed)
  Echocardiogram 2D Echocardiogram has been performed.  Celene Skeen 07/29/2017, 1:17 PM

## 2017-07-29 NOTE — Progress Notes (Signed)
CARDIAC REHAB PHASE I   PRE:  Rate/Rhythm: 105 ST    BP: sitting 127/89    SaO2: 96 RA  MODE:  Ambulation: 340 ft   POST:  Rate/Rhythm: 114 ST    BP: sitting 133/91     SaO2: 97 RA  Tolerated well except HR elevated. Pt sts this is normal for her. Discussed sternal precautions, IS, mobility and d/c planning. Pt is anxious but voiced understanding. She has many questions. Gave her resources for surgery.  [id number]   Harriet Masson CES, ACSM 07/29/2017 2:36 PM

## 2017-07-29 NOTE — Progress Notes (Signed)
Progress Note  Patient Name: [name] [name] Date of Encounter: 07/29/2017  Primary Cardiologist: New  Subjective   No chest pain this morning.   Inpatient Medications    Scheduled Meds: . amLODipine  10 mg Oral Daily  . aspirin EC  81 mg Oral Daily  . atorvastatin  80 mg Oral q1800  . Influenza vac split quadrivalent PF  0.5 mL Intramuscular Tomorrow-1000  . losartan  50 mg Oral Daily  . metoprolol tartrate  12.5 mg Oral BID   Continuous Infusions: . sodium chloride 20 mL/hr at 07/29/17 0537  . heparin 800 Units/hr (07/28/17 2058)   PRN Meds: acetaminophen, nitroGLYCERIN, ondansetron (ZOFRAN) IV, polyvinyl alcohol   Vital Signs    Vitals:   07/29/17 0342 07/29/17 0448 07/29/17 0454 07/29/17 0456  BP: 113/85 126/87  120/78  Pulse: 91 93 100 (!) 101  Resp: (!) 23 (!) Temp: 98.5 F (36.9 C)     TempSrc: Oral     SpO2: 96% 95% 90% (!) 88%  Weight: 186 lb (84.4 kg)     Height:        Intake/Output Summary (Last 24 hours) at 07/29/17 0725 Last data filed at 07/29/17 0537  Gross per 24 hour  Intake           266.29 ml  Output              100 ml  Net           166.29 ml   Filed Weights   07/28/17 1726 07/28/17 1900 07/29/17 0342  Weight: 187 lb (84.8 kg) 186 lb (84.4 kg) 186 lb (84.4 kg)    Telemetry    SR - Personally Reviewed  ECG    ST - Personally Reviewed  Physical Exam   General: Well developed, well nourished, female appearing in no acute distress. Head: Normocephalic, atraumatic.  Neck: Supple without bruits, JVD. Lungs:  Resp regular and unlabored, CTA. Heart: RRR, S1, S2, no S3, S4, or murmur; no rub. Abdomen: Soft, non-tender, non-distended with normoactive bowel sounds. No hepatomegaly. No rebound/guarding. No obvious abdominal masses. Extremities: No clubbing, cyanosis, edema. Distal pedal pulses are 2+ bilaterally. Neuro: Alert and oriented X 3. Moves all extremities spontaneously. Psych: Normal affect.  Labs      Chemistry Recent Labs Lab 07/28/17 1734 07/29/17 0222  NA 138 136  K 3.8 3.5  CL 105 106  CO2 22 24  GLUCOSE 116* 194*  BUN 23* 17  CREATININE 0.84 0.78  CALCIUM 10.8* 9.9  GFRNONAA >60 >60  GFRAA >60 >60  ANIONGAP 11 6     Hematology Recent Labs Lab 07/28/17 1734 07/29/17 0222  WBC 12.6* 11.3*  RBC 5.11 4.61  HGB 12.9 11.7*  HCT 40.4 36.4  MCV 79.1 79.0  MCH 25.2* 25.4*  MCHC 31.9 32.1  RDW 14.4 14.2  PLT 348 298    Cardiac Enzymes Recent Labs Lab 07/28/17 1945 07/28/17 2210 07/29/17 0222  TROPONINI 4.24* 4.45* 3.97*    Recent Labs Lab 07/28/17 1801  TROPIPOC 3.86*     BNPNo results for input(s): BNP, PROBNP in the last 168 hours.   DDimer No results for input(s): DDIMER in the last 168 hours.    Radiology    Dg Chest 2 View  Result Date: 07/28/2017 CLINICAL DATA:  Midsternal chest pain extending down the left upper extremity, several days duration. EXAM: CHEST  2 VIEW COMPARISON:  05/14/2004. FINDINGS: Unchanged borderline cardiomegaly. The lungs are  clear. The pulmonary vasculature is normal. There is no pleural effusion. Hilar and mediastinal contours are unremarkable and unchanged. IMPRESSION: Stable borderline cardiomegaly.  No consolidation or effusion. Electronically Signed   By: Ellery Plunk M.D.   On: 07/28/2017 18:22    Cardiac Studies   N/a  Patient Profile     51 y.o. female with PMH of HTN, DM and HL who presented with chest pain and ruled in with positive troponins.   Assessment & Plan    1. NSTEMI: Trop peaked at 4.45. EKG nonacute. Placed on IV heparin, and started on ASA, BB, and statin replaced with high dose Lipitor. Did have some intermittent episodes of chest pain since admission. Given symptoms, CRFs and labs, this is concerning for ACS. Will plan for cath today. This was discussed with the patient by Dr. Allyson Sabal.   -- continue IV heparin, BB, statin and ASA  2. HTN: controlled on current regimen  3. DM: Metformin  held while inpatient with plans for cath today.  -- SSI  4. HL: LDL 167 this admission. Home simvastatin replaced with high dose Lipitor.    Signed, Laverda Page, NP  07/29/2017, 7:25 AM  Pager # [phone]   Agree with note by Laverda Page NP-C  [name] [name] was admitted overnight with non-STEMI. She has positive cardiac risk factors. Her troponin rose to 4. Her pain began over the weekend and was more intense yesterday. She is on IV heparin and is pain-free. EKG shows no acute changes. Her exam is otherwise benign. Her labs are okay. She'll need diagnostic coronary angiography this morning.The patient understands that risks included but are not limited to stroke (1 in 1000), death (1 in 1000), kidney failure [usually temporary] (1 in 500), bleeding (1 in 200), allergic reaction [possibly serious] (1 in 200). The patient understands and agrees to proceed  Runell Gess, M.D., FACP, [facility], [facility] [address] [place], [place]  [address]  [phone] 07/29/2017 8:07 AM   For questions or updates, please contact [facility] Please consult [email or web address] for contact info under Cardiology/STEMI. Daytime calls, contact the Day Call APP (6a-8a) or assigned team (Teams A-D) provider (7:30a - 5p). All other daytime calls (7:30-5p), contact the Card Master @ [phone].   Nighttime calls, contact the assigned APP (5p-8p) or MD (6:30p-8p). Overnight calls (8p-6a), contact the on call Fellow @ [phone].

## 2017-07-30 ENCOUNTER — Inpatient Hospital Stay (HOSPITAL_COMMUNITY): Payer: 59

## 2017-07-30 ENCOUNTER — Inpatient Hospital Stay (HOSPITAL_COMMUNITY): Payer: 59 | Admitting: Critical Care Medicine

## 2017-07-30 ENCOUNTER — Encounter (HOSPITAL_COMMUNITY): Payer: Self-pay | Admitting: Certified Registered Nurse Anesthetist

## 2017-07-30 ENCOUNTER — Inpatient Hospital Stay (HOSPITAL_COMMUNITY)
Admission: EM | Disposition: A | Discharge status: Home / Self Care | Payer: Self-pay | Source: Home / Self Care | Attending: Thoracic Surgery (Cardiothoracic Vascular Surgery)

## 2017-07-30 DIAGNOSIS — Z951 Presence of aortocoronary bypass graft: Secondary | ICD-10-CM

## 2017-07-30 HISTORY — PX: CORONARY ARTERY BYPASS GRAFT: SHX141

## 2017-07-30 HISTORY — PX: INTRAOPERATIVE TRANSESOPHAGEAL ECHOCARDIOGRAM: SHX5062

## 2017-07-30 HISTORY — PX: RADIAL ARTERY HARVEST: SHX5067

## 2017-07-30 LAB — POCT I-STAT, CHEM 8
BUN: 9 mg/dL (ref 6–20)
BUN: 9 mg/dL (ref 6–20)
BUN: 7 mg/dL (ref 6–20)
BUN: 8 mg/dL (ref 6–20)
BUN: 8 mg/dL (ref 6–20)
BUN: 5 mg/dL — AB (ref 6–20)
CALCIUM ION: 1.02 mmol/L — AB (ref 1.15–1.40)
CALCIUM ION: 1.11 mmol/L — AB (ref 1.15–1.40)
CALCIUM ION: 1.1 mmol/L — AB (ref 1.15–1.40)
CHLORIDE: 104 mmol/L (ref 101–111)
CHLORIDE: 104 mmol/L (ref 101–111)
CHLORIDE: 100 mmol/L — AB (ref 101–111)
CHLORIDE: 104 mmol/L (ref 101–111)
CHLORIDE: 106 mmol/L (ref 101–111)
CREATININE: 0.3 mg/dL — AB (ref 0.44–1.00)
CREATININE: 0.4 mg/dL — AB (ref 0.44–1.00)
Calcium, Ion: 1.35 mmol/L (ref 1.15–1.40)
Calcium, Ion: 1.28 mmol/L (ref 1.15–1.40)
Calcium, Ion: 1.29 mmol/L (ref 1.15–1.40)
Chloride: 101 mmol/L (ref 101–111)
Creatinine, Ser: 0.4 mg/dL — ABNORMAL LOW (ref 0.44–1.00)
Creatinine, Ser: 0.4 mg/dL — ABNORMAL LOW (ref 0.44–1.00)
Creatinine, Ser: 0.4 mg/dL — ABNORMAL LOW (ref 0.44–1.00)
Creatinine, Ser: 0.5 mg/dL (ref 0.44–1.00)
GLUCOSE: 191 mg/dL — AB (ref 65–99)
GLUCOSE: 139 mg/dL — AB (ref 65–99)
Glucose, Bld: 168 mg/dL — ABNORMAL HIGH (ref 65–99)
Glucose, Bld: 151 mg/dL — ABNORMAL HIGH (ref 65–99)
Glucose, Bld: 148 mg/dL — ABNORMAL HIGH (ref 65–99)
Glucose, Bld: 115 mg/dL — ABNORMAL HIGH (ref 65–99)
HCT: 34 % — ABNORMAL LOW (ref 36.0–46.0)
HCT: 25 % — ABNORMAL LOW (ref 36.0–46.0)
HCT: 20 % — ABNORMAL LOW (ref 36.0–46.0)
HCT: 26 % — ABNORMAL LOW (ref 36.0–46.0)
HEMATOCRIT: 32 % — AB (ref 36.0–46.0)
HEMATOCRIT: 26 % — AB (ref 36.0–46.0)
Hemoglobin: 11.6 g/dL — ABNORMAL LOW (ref 12.0–15.0)
Hemoglobin: 10.9 g/dL — ABNORMAL LOW (ref 12.0–15.0)
Hemoglobin: 8.5 g/dL — ABNORMAL LOW (ref 12.0–15.0)
Hemoglobin: 6.8 g/dL — CL (ref 12.0–15.0)
Hemoglobin: 8.8 g/dL — ABNORMAL LOW (ref 12.0–15.0)
Hemoglobin: 8.8 g/dL — ABNORMAL LOW (ref 12.0–15.0)
POTASSIUM: 3.5 mmol/L (ref 3.5–5.1)
POTASSIUM: 4.4 mmol/L (ref 3.5–5.1)
Potassium: 3.8 mmol/L (ref 3.5–5.1)
Potassium: 4.1 mmol/L (ref 3.5–5.1)
Potassium: 4.5 mmol/L (ref 3.5–5.1)
Potassium: 4 mmol/L (ref 3.5–5.1)
SODIUM: 139 mmol/L (ref 135–145)
SODIUM: 139 mmol/L (ref 135–145)
SODIUM: 139 mmol/L (ref 135–145)
SODIUM: 142 mmol/L (ref 135–145)
Sodium: 141 mmol/L (ref 135–145)
Sodium: 136 mmol/L (ref 135–145)
TCO2: 26 mmol/L (ref 22–32)
TCO2: 27 mmol/L (ref 22–32)
TCO2: 25 mmol/L (ref 22–32)
TCO2: 25 mmol/L (ref 22–32)
TCO2: 24 mmol/L (ref 22–32)
TCO2: 24 mmol/L (ref 22–32)

## 2017-07-30 LAB — POCT I-STAT 3, ART BLOOD GAS (G3+)
ACID-BASE EXCESS: 1 mmol/L (ref 0.0–2.0)
ACID-BASE EXCESS: 1 mmol/L (ref 0.0–2.0)
Acid-Base Excess: 2 mmol/L (ref 0.0–2.0)
Acid-Base Excess: 5 mmol/L — ABNORMAL HIGH (ref 0.0–2.0)
Acid-Base Excess: 3 mmol/L — ABNORMAL HIGH (ref 0.0–2.0)
Acid-base deficit: 5 mmol/L — ABNORMAL HIGH (ref 0.0–2.0)
Acid-base deficit: 1 mmol/L (ref 0.0–2.0)
BICARBONATE: 21.7 mmol/L (ref 20.0–28.0)
BICARBONATE: 28.4 mmol/L — AB (ref 20.0–28.0)
BICARBONATE: 26.9 mmol/L (ref 20.0–28.0)
Bicarbonate: 26 mmol/L (ref 20.0–28.0)
Bicarbonate: 26.1 mmol/L (ref 20.0–28.0)
Bicarbonate: 24 mmol/L (ref 20.0–28.0)
Bicarbonate: 24.7 mmol/L (ref 20.0–28.0)
O2 SAT: 100 %
O2 SAT: 100 %
O2 SAT: 93 %
O2 SAT: 96 %
O2 Saturation: 94 %
O2 Saturation: 96 %
O2 Saturation: 90 %
PCO2 ART: 35.5 mmHg (ref 32.0–48.0)
PCO2 ART: 45.5 mmHg (ref 32.0–48.0)
PCO2 ART: 40.3 mmHg (ref 32.0–48.0)
PO2 ART: 318 mmHg — AB (ref 83.0–108.0)
PO2 ART: 345 mmHg — AB (ref 83.0–108.0)
PO2 ART: 73 mmHg — AB (ref 83.0–108.0)
PO2 ART: 81 mmHg — AB (ref 83.0–108.0)
Patient temperature: 35.9
Patient temperature: 35.8
TCO2: 27 mmol/L (ref 22–32)
TCO2: 27 mmol/L (ref 22–32)
TCO2: 23 mmol/L (ref 22–32)
TCO2: 25 mmol/L (ref 22–32)
TCO2: 26 mmol/L (ref 22–32)
TCO2: 30 mmol/L (ref 22–32)
TCO2: 28 mmol/L (ref 22–32)
pCO2 arterial: 40.5 mmHg (ref 32.0–48.0)
pCO2 arterial: 35.8 mmHg (ref 32.0–48.0)
pCO2 arterial: 38.9 mmHg (ref 32.0–48.0)
pCO2 arterial: 38.4 mmHg (ref 32.0–48.0)
pH, Arterial: 7.472 — ABNORMAL HIGH (ref 7.350–7.450)
pH, Arterial: 7.417 (ref 7.350–7.450)
pH, Arterial: 7.282 — ABNORMAL LOW (ref 7.350–7.450)
pH, Arterial: 7.377 (ref 7.350–7.450)
pH, Arterial: 7.446 (ref 7.350–7.450)
pH, Arterial: 7.475 — ABNORMAL HIGH (ref 7.350–7.450)
pH, Arterial: 7.458 — ABNORMAL HIGH (ref 7.350–7.450)
pO2, Arterial: 66 mmHg — ABNORMAL LOW (ref 83.0–108.0)
pO2, Arterial: 79 mmHg — ABNORMAL LOW (ref 83.0–108.0)
pO2, Arterial: 59 mmHg — ABNORMAL LOW (ref 83.0–108.0)

## 2017-07-30 LAB — BASIC METABOLIC PANEL
ANION GAP: 8 (ref 5–15)
BUN: 12 mg/dL (ref 6–20)
CALCIUM: 10 mg/dL (ref 8.9–10.3)
CHLORIDE: 104 mmol/L (ref 101–111)
CO2: 25 mmol/L (ref 22–32)
Creatinine, Ser: 0.72 mg/dL (ref 0.44–1.00)
GLUCOSE: 136 mg/dL — AB (ref 65–99)
Potassium: 3.6 mmol/L (ref 3.5–5.1)
Sodium: 137 mmol/L (ref 135–145)

## 2017-07-30 LAB — PROTIME-INR
INR: 1.62
PROTHROMBIN TIME: 19.1 s — AB (ref 11.4–15.2)

## 2017-07-30 LAB — HEMOGLOBIN AND HEMATOCRIT, BLOOD
HEMATOCRIT: 22.3 % — AB (ref 36.0–46.0)
HEMOGLOBIN: 7 g/dL — AB (ref 12.0–15.0)

## 2017-07-30 LAB — CREATININE, SERUM
CREATININE: 0.55 mg/dL (ref 0.44–1.00)
GFR calc non Af Amer: >60 mL/min (ref >60)

## 2017-07-30 LAB — GLUCOSE, CAPILLARY
GLUCOSE-CAPILLARY: 142 mg/dL — AB (ref 65–99)
GLUCOSE-CAPILLARY: 139 mg/dL — AB (ref 65–99)
GLUCOSE-CAPILLARY: 130 mg/dL — AB (ref 65–99)
GLUCOSE-CAPILLARY: 124 mg/dL — AB (ref 65–99)
Glucose-Capillary: 133 mg/dL — ABNORMAL HIGH (ref 65–99)
Glucose-Capillary: 130 mg/dL — ABNORMAL HIGH (ref 65–99)
Glucose-Capillary: 113 mg/dL — ABNORMAL HIGH (ref 65–99)
Glucose-Capillary: 114 mg/dL — ABNORMAL HIGH (ref 65–99)

## 2017-07-30 LAB — CBC
HCT: 28.3 % — ABNORMAL LOW (ref 36.0–46.0)
HEMATOCRIT: 24.5 % — AB (ref 36.0–46.0)
HEMATOCRIT: 36.1 % (ref 36.0–46.0)
HEMOGLOBIN: 8 g/dL — AB (ref 12.0–15.0)
Hemoglobin: 11.7 g/dL — ABNORMAL LOW (ref 12.0–15.0)
Hemoglobin: 9.1 g/dL — ABNORMAL LOW (ref 12.0–15.0)
MCH: 25.7 pg — ABNORMAL LOW (ref 26.0–34.0)
MCH: 25.3 pg — ABNORMAL LOW (ref 26.0–34.0)
MCH: 25.4 pg — AB (ref 26.0–34.0)
MCHC: 32.4 g/dL (ref 30.0–36.0)
MCHC: 32.2 g/dL (ref 30.0–36.0)
MCHC: 32.7 g/dL (ref 30.0–36.0)
MCV: 78.8 fL (ref 78.0–100.0)
MCV: 79.2 fL (ref 78.0–100.0)
MCV: 77.8 fL — ABNORMAL LOW (ref 78.0–100.0)
Platelets: 322 10*3/uL (ref 150–400)
Platelets: 188 10*3/uL (ref 150–400)
Platelets: 160 10*3/uL (ref 150–400)
RBC: 4.56 MIL/uL (ref 3.87–5.11)
RBC: 3.59 MIL/uL — AB (ref 3.87–5.11)
RBC: 3.15 MIL/uL — ABNORMAL LOW (ref 3.87–5.11)
RDW: 14.3 % (ref 11.5–15.5)
RDW: 14.3 % (ref 11.5–15.5)
RDW: 14.3 % (ref 11.5–15.5)
WBC: 19.7 10*3/uL — ABNORMAL HIGH (ref 4.0–10.5)
WBC: 10 10*3/uL (ref 4.0–10.5)
WBC: 9.8 10*3/uL (ref 4.0–10.5)

## 2017-07-30 LAB — POCT I-STAT 4, (NA,K, GLUC, HGB,HCT)
Glucose, Bld: 140 mg/dL — ABNORMAL HIGH (ref 65–99)
HCT: 28 % — ABNORMAL LOW (ref 36.0–46.0)
Hemoglobin: 9.5 g/dL — ABNORMAL LOW (ref 12.0–15.0)
POTASSIUM: 4.3 mmol/L (ref 3.5–5.1)
SODIUM: 142 mmol/L (ref 135–145)

## 2017-07-30 LAB — HEPARIN LEVEL (UNFRACTIONATED): Heparin Unfractionated: 0.13 IU/mL — ABNORMAL LOW (ref 0.30–0.70)

## 2017-07-30 LAB — MAGNESIUM: Magnesium: 2.7 mg/dL — ABNORMAL HIGH (ref 1.7–2.4)

## 2017-07-30 LAB — PREPARE RBC (CROSSMATCH)

## 2017-07-30 LAB — APTT: aPTT: 36 seconds (ref 24–36)

## 2017-07-30 LAB — PLATELET COUNT: Platelets: 222 10*3/uL (ref 150–400)

## 2017-07-30 SURGERY — CORONARY ARTERY BYPASS GRAFTING (CABG)
Anesthesia: General | Site: Chest

## 2017-07-30 MED ORDER — MIDAZOLAM HCL 10 MG/2ML IJ SOLN
INTRAMUSCULAR | Status: AC
  Filled 2017-07-30: qty 2

## 2017-07-30 MED ORDER — MIDAZOLAM HCL 2 MG/2ML IJ SOLN: 2.0000 mg | INTRAMUSCULAR | Status: DC | PRN

## 2017-07-30 MED ORDER — TRAMADOL HCL 50 MG PO TABS
50.0000 mg | ORAL_TABLET | ORAL | Status: DC | PRN
  Administered 2017-08-01 (×2): 50 mg via ORAL
  Administered 2017-08-01: 100 mg via ORAL
  Administered 2017-08-01: 50 mg via ORAL
  Administered 2017-08-03 – 2017-08-05 (×4): 100 mg via ORAL
  Administered 2017-08-06: 50 mg via ORAL
  Filled 2017-07-30 (×3): qty 2
  Filled 2017-07-30: qty 1
  Filled 2017-07-30 (×4): qty 2

## 2017-07-30 MED ORDER — ACETAMINOPHEN 500 MG PO TABS
1000.0000 mg | ORAL_TABLET | Freq: Four times a day (QID) | ORAL | Status: AC
  Administered 2017-07-31 – 2017-08-04 (×18): 1000 mg via ORAL
  Filled 2017-07-30 (×18): qty 2

## 2017-07-30 MED ORDER — DOCUSATE SODIUM 100 MG PO CAPS
200.0000 mg | ORAL_CAPSULE | Freq: Every day | ORAL | Status: DC
  Administered 2017-07-31 – 2017-08-02 (×3): 200 mg via ORAL
  Filled 2017-07-30 (×3): qty 2

## 2017-07-30 MED ORDER — SODIUM CHLORIDE 0.9 % IV SOLN
INTRAVENOUS | Status: DC | PRN
  Administered 2017-07-30: 13:00:00 via INTRAVENOUS

## 2017-07-30 MED ORDER — ACETAMINOPHEN 650 MG RE SUPP
650.0000 mg | Freq: Once | RECTAL | Status: AC
  Administered 2017-07-30: 650 mg via RECTAL

## 2017-07-30 MED ORDER — POTASSIUM CHLORIDE 10 MEQ/50ML IV SOLN: 10.0000 meq | INTRAVENOUS | Status: AC

## 2017-07-30 MED ORDER — MAGNESIUM SULFATE 4 GM/100ML IV SOLN
INTRAVENOUS | Status: AC
  Filled 2017-07-30: qty 100

## 2017-07-30 MED ORDER — SODIUM CHLORIDE 0.45 % IV SOLN
INTRAVENOUS | Status: DC | PRN
Start: 2017-07-30 — End: 2017-08-02
  Administered 2017-07-30: 14:00:00 via INTRAVENOUS

## 2017-07-30 MED ORDER — SODIUM CHLORIDE 0.9 % IV SOLN
0.0000 ug/kg/h | INTRAVENOUS | Status: DC
  Filled 2017-07-30: qty 2

## 2017-07-30 MED ORDER — ALBUMIN HUMAN 5 % IV SOLN
INTRAVENOUS | Status: DC | PRN
  Administered 2017-07-30 (×2): via INTRAVENOUS

## 2017-07-30 MED ORDER — MAGNESIUM SULFATE 4 GM/100ML IV SOLN
4.0000 g | Freq: Once | INTRAVENOUS | Status: AC
  Administered 2017-07-30: 4 g via INTRAVENOUS
  Filled 2017-07-30: qty 100

## 2017-07-30 MED ORDER — METOPROLOL TARTRATE 12.5 MG HALF TABLET: 12.5000 mg | ORAL_TABLET | Freq: Two times a day (BID) | ORAL | Status: DC

## 2017-07-30 MED ORDER — NITROGLYCERIN 0.2 MG/ML ON CALL CATH LAB
INTRAVENOUS | Status: DC | PRN
  Administered 2017-07-30 (×2): 20 ug via INTRAVENOUS

## 2017-07-30 MED ORDER — NITROGLYCERIN IN D5W 200-5 MCG/ML-% IV SOLN
0.0000 ug/min | INTRAVENOUS | Status: AC
  Filled 2017-07-30: qty 250

## 2017-07-30 MED ORDER — MIDAZOLAM HCL 5 MG/5ML IJ SOLN
INTRAMUSCULAR | Status: DC | PRN
  Administered 2017-07-30: 1 mg via INTRAVENOUS
  Administered 2017-07-30: 2 mg via INTRAVENOUS
  Administered 2017-07-30: 3 mg via INTRAVENOUS
  Administered 2017-07-30 (×4): 2 mg via INTRAVENOUS

## 2017-07-30 MED ORDER — LIDOCAINE 2% (20 MG/ML) 5 ML SYRINGE
INTRAMUSCULAR | Status: DC | PRN
Start: 2017-07-30 — End: 2017-07-30
  Administered 2017-07-30: 50 mg via INTRAVENOUS

## 2017-07-30 MED ORDER — PROTAMINE SULFATE 10 MG/ML IV SOLN
INTRAVENOUS | Status: AC
  Filled 2017-07-30: qty 25

## 2017-07-30 MED ORDER — VANCOMYCIN HCL IN DEXTROSE 1-5 GM/200ML-% IV SOLN
1000.0000 mg | Freq: Once | INTRAVENOUS | Status: AC
  Administered 2017-07-30: 1000 mg via INTRAVENOUS
  Filled 2017-07-30: qty 200

## 2017-07-30 MED ORDER — METOCLOPRAMIDE HCL 5 MG/ML IJ SOLN
10.0000 mg | Freq: Four times a day (QID) | INTRAMUSCULAR | Status: AC
  Administered 2017-07-30 – 2017-07-31 (×3): 10 mg via INTRAVENOUS
  Filled 2017-07-30 (×2): qty 2

## 2017-07-30 MED ORDER — MIDAZOLAM HCL 2 MG/2ML IJ SOLN
INTRAMUSCULAR | Status: AC
  Filled 2017-07-30: qty 2

## 2017-07-30 MED ORDER — CALCIUM CHLORIDE 10 % IV SOLN
1.0000 g | Freq: Once | INTRAVENOUS | Status: AC
  Administered 2017-07-30: 1 g via INTRAVENOUS

## 2017-07-30 MED ORDER — LACTATED RINGERS IV SOLN
INTRAVENOUS | Status: DC | PRN
  Administered 2017-07-30 (×2): via INTRAVENOUS

## 2017-07-30 MED ORDER — 0.9 % SODIUM CHLORIDE (POUR BTL) OPTIME
TOPICAL | Status: DC | PRN
  Administered 2017-07-30: 6000 mL

## 2017-07-30 MED ORDER — HEPARIN SODIUM (PORCINE) 1000 UNIT/ML IJ SOLN
INTRAMUSCULAR | Status: AC
  Filled 2017-07-30: qty 1

## 2017-07-30 MED ORDER — ROCURONIUM BROMIDE 10 MG/ML (PF) SYRINGE
PREFILLED_SYRINGE | INTRAVENOUS | Status: DC | PRN
Start: 2017-07-30 — End: 2017-07-30
  Administered 2017-07-30 (×2): 50 mg via INTRAVENOUS

## 2017-07-30 MED ORDER — ACETAMINOPHEN 160 MG/5ML PO SOLN: 650.0000 mg | Freq: Once | ORAL | Status: AC

## 2017-07-30 MED ORDER — HEMOSTATIC AGENTS (NO CHARGE) OPTIME
TOPICAL | Status: DC | PRN
  Administered 2017-07-30: 1 via TOPICAL

## 2017-07-30 MED ORDER — PROPOFOL 10 MG/ML IV BOLUS
INTRAVENOUS | Status: DC | PRN
  Administered 2017-07-30: 50 mg via INTRAVENOUS
  Administered 2017-07-30: 100 mg via INTRAVENOUS
  Administered 2017-07-30: 50 mg via INTRAVENOUS

## 2017-07-30 MED ORDER — FAMOTIDINE IN NACL 20-0.9 MG/50ML-% IV SOLN
20.0000 mg | Freq: Two times a day (BID) | INTRAVENOUS | Status: AC
  Administered 2017-07-30 (×2): 20 mg via INTRAVENOUS
  Filled 2017-07-30: qty 50

## 2017-07-30 MED ORDER — PHENYLEPHRINE 40 MCG/ML (10ML) SYRINGE FOR IV PUSH (FOR BLOOD PRESSURE SUPPORT)
PREFILLED_SYRINGE | INTRAVENOUS | Status: DC | PRN
  Administered 2017-07-30 (×5): 80 ug via INTRAVENOUS

## 2017-07-30 MED ORDER — FENTANYL CITRATE (PF) 250 MCG/5ML IJ SOLN
INTRAMUSCULAR | Status: AC
  Filled 2017-07-30: qty 25

## 2017-07-30 MED ORDER — METOPROLOL TARTRATE 5 MG/5ML IV SOLN: 2.5000 mg | INTRAVENOUS | Status: DC | PRN

## 2017-07-30 MED ORDER — ALBUMIN HUMAN 5 % IV SOLN
250.0000 mL | INTRAVENOUS | Status: AC | PRN
  Administered 2017-07-30 (×3): 250 mL via INTRAVENOUS
  Filled 2017-07-30 (×2): qty 250

## 2017-07-30 MED ORDER — HEPARIN SODIUM (PORCINE) 1000 UNIT/ML IJ SOLN
INTRAMUSCULAR | Status: DC | PRN
  Administered 2017-07-30: 30000 [IU] via INTRAVENOUS

## 2017-07-30 MED ORDER — SODIUM CHLORIDE 0.9 % IV SOLN
INTRAVENOUS | Status: DC
  Administered 2017-07-30: 14:00:00 via INTRAVENOUS

## 2017-07-30 MED ORDER — INSULIN REGULAR BOLUS VIA INFUSION
0.0000 [IU] | Freq: Three times a day (TID) | INTRAVENOUS | Status: DC
  Filled 2017-07-30: qty 10

## 2017-07-30 MED ORDER — SODIUM CHLORIDE 0.9% FLUSH
3.0000 mL | Freq: Two times a day (BID) | INTRAVENOUS | Status: DC
  Administered 2017-07-31 – 2017-08-02 (×4): 3 mL via INTRAVENOUS

## 2017-07-30 MED ORDER — CHLORHEXIDINE GLUCONATE 0.12 % MT SOLN
15.0000 mL | OROMUCOSAL | Status: AC
  Administered 2017-07-30: 15 mL via OROMUCOSAL

## 2017-07-30 MED ORDER — MORPHINE SULFATE (PF) 4 MG/ML IV SOLN
1.0000 mg | INTRAVENOUS | Status: AC | PRN
  Administered 2017-07-30: 2 mg via INTRAVENOUS
  Filled 2017-07-30: qty 1

## 2017-07-30 MED ORDER — FENTANYL CITRATE (PF) 250 MCG/5ML IJ SOLN
INTRAMUSCULAR | Status: AC
  Filled 2017-07-30: qty 5

## 2017-07-30 MED ORDER — PROTAMINE SULFATE 10 MG/ML IV SOLN
INTRAVENOUS | Status: DC | PRN
  Administered 2017-07-30: 300 mg via INTRAVENOUS

## 2017-07-30 MED ORDER — ASPIRIN 81 MG PO CHEW
324.0000 mg | CHEWABLE_TABLET | Freq: Every day | ORAL | Status: DC
  Filled 2017-07-30: qty 4

## 2017-07-30 MED ORDER — SODIUM CHLORIDE 0.9 % IV SOLN
0.0000 ug/min | INTRAVENOUS | Status: DC
  Filled 2017-07-30 (×2): qty 2

## 2017-07-30 MED ORDER — PROTAMINE SULFATE 10 MG/ML IV SOLN
INTRAVENOUS | Status: AC
  Filled 2017-07-30: qty 5

## 2017-07-30 MED ORDER — LACTATED RINGERS IV SOLN: 500.0000 mL | Freq: Once | INTRAVENOUS | Status: DC | PRN

## 2017-07-30 MED ORDER — PROPOFOL 10 MG/ML IV BOLUS
INTRAVENOUS | Status: AC
  Filled 2017-07-30: qty 20

## 2017-07-30 MED ORDER — BISACODYL 5 MG PO TBEC
10.0000 mg | DELAYED_RELEASE_TABLET | Freq: Every day | ORAL | Status: DC
  Administered 2017-07-31 – 2017-08-02 (×3): 10 mg via ORAL
  Filled 2017-07-30 (×3): qty 2

## 2017-07-30 MED ORDER — OXYCODONE HCL 5 MG PO TABS
5.0000 mg | ORAL_TABLET | ORAL | Status: DC | PRN
  Administered 2017-07-31: 5 mg via ORAL
  Administered 2017-07-31: 10 mg via ORAL
  Administered 2017-08-01: 5 mg via ORAL
  Filled 2017-07-30 (×3): qty 1
  Filled 2017-07-30 (×2): qty 2

## 2017-07-30 MED ORDER — CHLORHEXIDINE GLUCONATE 4 % EX LIQD
CUTANEOUS | Status: AC
  Administered 2017-07-30: 05:00:00
  Filled 2017-07-30: qty 15

## 2017-07-30 MED ORDER — LACTATED RINGERS IV SOLN
INTRAVENOUS | Status: DC | PRN
  Administered 2017-07-30: 07:00:00 via INTRAVENOUS

## 2017-07-30 MED ORDER — ROCURONIUM BROMIDE 50 MG/5ML IV SOLN
INTRAVENOUS | Status: AC
  Filled 2017-07-30: qty 2

## 2017-07-30 MED ORDER — FENTANYL CITRATE (PF) 250 MCG/5ML IJ SOLN
INTRAMUSCULAR | Status: DC | PRN
  Administered 2017-07-30 (×3): 100 ug via INTRAVENOUS
  Administered 2017-07-30: 50 ug via INTRAVENOUS
  Administered 2017-07-30: 100 ug via INTRAVENOUS
  Administered 2017-07-30: 150 ug via INTRAVENOUS
  Administered 2017-07-30 (×2): 50 ug via INTRAVENOUS
  Administered 2017-07-30: 150 ug via INTRAVENOUS
  Administered 2017-07-30 (×2): 50 ug via INTRAVENOUS
  Administered 2017-07-30 (×4): 100 ug via INTRAVENOUS
  Administered 2017-07-30: 150 ug via INTRAVENOUS

## 2017-07-30 MED ORDER — ACETAMINOPHEN 160 MG/5ML PO SOLN: 1000.0000 mg | Freq: Four times a day (QID) | ORAL | Status: AC

## 2017-07-30 MED ORDER — SODIUM BICARBONATE 8.4 % IV SOLN
50.0000 meq | Freq: Once | INTRAVENOUS | Status: AC
  Administered 2017-07-30: 50 meq via INTRAVENOUS

## 2017-07-30 MED ORDER — METOPROLOL TARTRATE 25 MG/10 ML ORAL SUSPENSION: 12.5000 mg | Freq: Two times a day (BID) | ORAL | Status: DC

## 2017-07-30 MED ORDER — SODIUM CHLORIDE 0.9 % IV SOLN: 250.0000 mL | INTRAVENOUS | Status: DC

## 2017-07-30 MED ORDER — LACTATED RINGERS IV SOLN
INTRAVENOUS | Status: DC | PRN
  Administered 2017-07-30: 08:00:00 via INTRAVENOUS

## 2017-07-30 MED ORDER — ALBUMIN HUMAN 5 % IV SOLN
12.5000 g | Freq: Once | INTRAVENOUS | Status: AC
  Administered 2017-07-30: 12.5 g via INTRAVENOUS
  Filled 2017-07-30: qty 250

## 2017-07-30 MED ORDER — LIDOCAINE 2% (20 MG/ML) 5 ML SYRINGE
INTRAMUSCULAR | Status: AC
  Filled 2017-07-30: qty 5

## 2017-07-30 MED ORDER — MORPHINE SULFATE (PF) 4 MG/ML IV SOLN
2.0000 mg | INTRAVENOUS | Status: DC | PRN
  Administered 2017-07-30 – 2017-07-31 (×2): 2 mg via INTRAVENOUS
  Administered 2017-07-31: 5 mg via INTRAVENOUS
  Administered 2017-07-31 – 2017-08-01 (×2): 2 mg via INTRAVENOUS
  Filled 2017-07-30 (×2): qty 1
  Filled 2017-07-30: qty 2
  Filled 2017-07-30 (×3): qty 1

## 2017-07-30 MED ORDER — LACTATED RINGERS IV SOLN: INTRAVENOUS | Status: DC

## 2017-07-30 MED ORDER — ASPIRIN EC 325 MG PO TBEC
325.0000 mg | DELAYED_RELEASE_TABLET | Freq: Every day | ORAL | Status: DC
  Administered 2017-07-31 – 2017-08-06 (×7): 325 mg via ORAL
  Filled 2017-07-30 (×7): qty 1

## 2017-07-30 MED ORDER — ONDANSETRON HCL 4 MG/2ML IJ SOLN: 4.0000 mg | Freq: Four times a day (QID) | INTRAMUSCULAR | Status: DC | PRN

## 2017-07-30 MED ORDER — LEVOFLOXACIN IN D5W 750 MG/150ML IV SOLN
750.0000 mg | INTRAVENOUS | Status: AC
  Administered 2017-07-31: 750 mg via INTRAVENOUS
  Filled 2017-07-30: qty 150

## 2017-07-30 MED ORDER — ROCURONIUM BROMIDE 100 MG/10ML IV SOLN
INTRAVENOUS | Status: DC | PRN
  Administered 2017-07-30: 60 mg via INTRAVENOUS
  Administered 2017-07-30: 40 mg via INTRAVENOUS
  Administered 2017-07-30 (×2): 50 mg via INTRAVENOUS

## 2017-07-30 MED ORDER — SODIUM CHLORIDE 0.9% FLUSH: 3.0000 mL | INTRAVENOUS | Status: DC | PRN

## 2017-07-30 MED ORDER — DOPAMINE-DEXTROSE 3.2-5 MG/ML-% IV SOLN
0.0000 ug/kg/min | INTRAVENOUS | Status: DC
  Administered 2017-07-30: 2 ug/kg/min via INTRAVENOUS

## 2017-07-30 MED ORDER — SODIUM CHLORIDE 0.9 % IJ SOLN
OROMUCOSAL | Status: DC | PRN
  Administered 2017-07-30 (×3): 4 mL via TOPICAL

## 2017-07-30 MED ORDER — ROCURONIUM BROMIDE 50 MG/5ML IV SOLN
INTRAVENOUS | Status: AC
  Filled 2017-07-30: qty 4

## 2017-07-30 MED ORDER — PANTOPRAZOLE SODIUM 40 MG PO TBEC
40.0000 mg | DELAYED_RELEASE_TABLET | Freq: Every day | ORAL | Status: DC
  Administered 2017-08-01 – 2017-08-06 (×6): 40 mg via ORAL
  Filled 2017-07-30 (×6): qty 1

## 2017-07-30 MED ORDER — BISACODYL 10 MG RE SUPP: 10.0000 mg | Freq: Every day | RECTAL | Status: DC

## 2017-07-30 MED ORDER — PHENYLEPHRINE HCL 10 MG/ML IJ SOLN
INTRAVENOUS | Status: DC | PRN
  Administered 2017-07-30: 15 ug/min via INTRAVENOUS

## 2017-07-30 MED ORDER — SODIUM CHLORIDE 0.9 % IV SOLN
INTRAVENOUS | Status: DC
  Administered 2017-07-30: 3.8 [IU]/h via INTRAVENOUS
  Filled 2017-07-30 (×2): qty 1

## 2017-07-30 SURGICAL SUPPLY — 120 items
APPLIER CLIP 9.375 SM OPEN (CLIP)
BAG DECANTER FOR FLEXI CONT (MISCELLANEOUS) ×5 IMPLANT
BANDAGE ACE 4X5 VEL STRL LF (GAUZE/BANDAGES/DRESSINGS) ×10 IMPLANT
BANDAGE ACE 6X5 VEL STRL LF (GAUZE/BANDAGES/DRESSINGS) ×5 IMPLANT
BASKET HEART  (ORDER IN 25'S) (MISCELLANEOUS) ×1
BASKET HEART (ORDER IN 25'S) (MISCELLANEOUS) ×1
BASKET HEART (ORDER IN 25S) (MISCELLANEOUS) ×3 IMPLANT
BINDER BREAST XLRG (GAUZE/BANDAGES/DRESSINGS) ×5 IMPLANT
BLADE STERNUM SYSTEM 6 (BLADE) ×5 IMPLANT
BLADE SURG 11 STRL SS (BLADE) ×5 IMPLANT
BLADE SURG 15 STRL LF DISP TIS (BLADE) IMPLANT
BLADE SURG 15 STRL SS (BLADE)
BLADE SURG ROTATE 9660 (MISCELLANEOUS) ×5 IMPLANT
BNDG GAUZE ELAST 4 BULKY (GAUZE/BANDAGES/DRESSINGS) ×10 IMPLANT
CANISTER SUCT 3000ML PPV (MISCELLANEOUS) ×5 IMPLANT
CANNULA EZ GLIDE AORTIC 21FR (CANNULA) ×10 IMPLANT
CATH CPB KIT HENDRICKSON (MISCELLANEOUS) ×5 IMPLANT
CATH ROBINSON RED A/P 18FR (CATHETERS) ×5 IMPLANT
CATH THORACIC 36FR (CATHETERS) ×5 IMPLANT
CATH THORACIC 36FR RT ANG (CATHETERS) ×5 IMPLANT
CLIP APPLIE 9.375 SM OPEN (CLIP) IMPLANT
CLIP VESOCCLUDE MED 24/CT (CLIP) IMPLANT
CLIP VESOCCLUDE SM WIDE 24/CT (CLIP) ×15 IMPLANT
COVER MAYO STAND STRL (DRAPES) ×5 IMPLANT
CRADLE DONUT ADULT HEAD (MISCELLANEOUS) ×5 IMPLANT
DERMABOND ADHESIVE PROPEN (GAUZE/BANDAGES/DRESSINGS) ×2
DERMABOND ADVANCED .7 DNX6 (GAUZE/BANDAGES/DRESSINGS) ×3 IMPLANT
DRAPE CARDIOVASCULAR INCISE (DRAPES) ×2
DRAPE HALF SHEET 40X57 (DRAPES) IMPLANT
DRAPE SLUSH/WARMER DISC (DRAPES) ×5 IMPLANT
DRAPE SRG 135X102X78XABS (DRAPES) ×3 IMPLANT
DRSG COVADERM 4X14 (GAUZE/BANDAGES/DRESSINGS) ×5 IMPLANT
ELECT REM PT RETURN 9FT ADLT (ELECTROSURGICAL) ×10
ELECTRODE REM PT RTRN 9FT ADLT (ELECTROSURGICAL) ×6 IMPLANT
FELT TEFLON 1X6 (MISCELLANEOUS) ×10 IMPLANT
GAUZE SPONGE 4X4 12PLY STRL (GAUZE/BANDAGES/DRESSINGS) ×15 IMPLANT
GEL ULTRASOUND 20GR AQUASONIC (MISCELLANEOUS) IMPLANT
GLOVE BIO SURGEON STRL SZ 6.5 (GLOVE) ×24 IMPLANT
GLOVE BIO SURGEONS STRL SZ 6.5 (GLOVE) ×6
GLOVE BIOGEL PI IND STRL 6 (GLOVE) ×12 IMPLANT
GLOVE BIOGEL PI IND STRL 6.5 (GLOVE) ×3 IMPLANT
GLOVE BIOGEL PI INDICATOR 6 (GLOVE) ×8
GLOVE BIOGEL PI INDICATOR 6.5 (GLOVE) ×2
GLOVE SURG SIGNA 7.5 PF LTX (GLOVE) ×15 IMPLANT
GOWN STRL REUS W/ TWL LRG LVL3 (GOWN DISPOSABLE) ×24 IMPLANT
GOWN STRL REUS W/ TWL XL LVL3 (GOWN DISPOSABLE) ×6 IMPLANT
GOWN STRL REUS W/TWL LRG LVL3 (GOWN DISPOSABLE) ×16
GOWN STRL REUS W/TWL XL LVL3 (GOWN DISPOSABLE) ×4
HARMONIC SHEARS 14CM COAG (MISCELLANEOUS) ×5 IMPLANT
HEMOSTAT POWDER SURGIFOAM 1G (HEMOSTASIS) ×15 IMPLANT
HEMOSTAT SURGICEL 2X14 (HEMOSTASIS) ×5 IMPLANT
INSERT FOGARTY XLG (MISCELLANEOUS) IMPLANT
IV CATH 18G X1.75 CATHLON (IV SOLUTION) ×5 IMPLANT
KIT BASIN OR (CUSTOM PROCEDURE TRAY) ×5 IMPLANT
KIT ROOM TURNOVER OR (KITS) ×5 IMPLANT
KIT SUCTION CATH 14FR (SUCTIONS) ×10 IMPLANT
KIT VASOVIEW HEMOPRO VH 3000 (KITS) ×5 IMPLANT
MARKER GRAFT CORONARY BYPASS (MISCELLANEOUS) ×15 IMPLANT
NS IRRIG 1000ML POUR BTL (IV SOLUTION) ×30 IMPLANT
PACK OPEN HEART (CUSTOM PROCEDURE TRAY) ×5 IMPLANT
PAD ARMBOARD 7.5X6 YLW CONV (MISCELLANEOUS) ×10 IMPLANT
PAD ELECT DEFIB RADIOL ZOLL (MISCELLANEOUS) ×5 IMPLANT
PENCIL BUTTON HOLSTER BLD 10FT (ELECTRODE) ×5 IMPLANT
PUNCH AORTIC ROTATE 4.0MM (MISCELLANEOUS) IMPLANT
PUNCH AORTIC ROTATE 4.5MM 8IN (MISCELLANEOUS) ×5 IMPLANT
PUNCH AORTIC ROTATE 5MM 8IN (MISCELLANEOUS) IMPLANT
SET CARDIOPLEGIA MPS 5001102 (MISCELLANEOUS) ×5 IMPLANT
SHEARS HARMONIC 9CM CVD (BLADE) ×5 IMPLANT
SOLUTION ANTI FOG 6CC (MISCELLANEOUS) ×5 IMPLANT
SPONGE LAP 18X18 X RAY DECT (DISPOSABLE) ×5 IMPLANT
SPONGE LAP 4X18 X RAY DECT (DISPOSABLE) ×5 IMPLANT
SUT BONE WAX W31G (SUTURE) ×5 IMPLANT
SUT ETHIBOND 2 0 SH (SUTURE) ×8
SUT ETHIBOND 2 0 SH 36X2 (SUTURE) ×12 IMPLANT
SUT MNCRL AB 4-0 PS2 18 (SUTURE) ×5 IMPLANT
SUT PROLENE 3 0 SH DA (SUTURE) ×5 IMPLANT
SUT PROLENE 4 0 RB 1 (SUTURE) ×10
SUT PROLENE 4 0 SH DA (SUTURE) IMPLANT
SUT PROLENE 4-0 RB1 .5 CRCL 36 (SUTURE) ×15 IMPLANT
SUT PROLENE 5 0 C 1 36 (SUTURE) IMPLANT
SUT PROLENE 6 0 C 1 30 (SUTURE) ×30 IMPLANT
SUT PROLENE 7 0 BV1 MDA (SUTURE) ×10 IMPLANT
SUT PROLENE 8 0 BV175 6 (SUTURE) ×15 IMPLANT
SUT SILK  1 MH (SUTURE) ×10
SUT SILK 1 MH (SUTURE) ×15 IMPLANT
SUT SILK 1 TIES 10X30 (SUTURE) ×5 IMPLANT
SUT SILK 2 0 SH CR/8 (SUTURE) ×10 IMPLANT
SUT SILK 2 0 TIES 10X30 (SUTURE) ×5 IMPLANT
SUT SILK 2 0 TIES 17X18 (SUTURE) ×2
SUT SILK 2-0 18XBRD TIE BLK (SUTURE) ×3 IMPLANT
SUT SILK 3 0 SH CR/8 (SUTURE) ×5 IMPLANT
SUT SILK 4 0 TIE 10X30 (SUTURE) ×10 IMPLANT
SUT STEEL 6MS V (SUTURE) ×5 IMPLANT
SUT STEEL STERNAL CCS#1 18IN (SUTURE) IMPLANT
SUT STEEL SZ 6 DBL 3X14 BALL (SUTURE) ×5 IMPLANT
SUT TEM PAC WIRE 2 0 SH (SUTURE) ×20 IMPLANT
SUT VIC AB 1 CTX 36 (SUTURE) ×6
SUT VIC AB 1 CTX36XBRD ANBCTR (SUTURE) ×9 IMPLANT
SUT VIC AB 2-0 CT1 27 (SUTURE) ×6
SUT VIC AB 2-0 CT1 TAPERPNT 27 (SUTURE) ×9 IMPLANT
SUT VIC AB 2-0 CTX 27 (SUTURE) ×10 IMPLANT
SUT VIC AB 3-0 SH 27 (SUTURE) ×2
SUT VIC AB 3-0 SH 27X BRD (SUTURE) ×3 IMPLANT
SUT VIC AB 3-0 X1 27 (SUTURE) ×15 IMPLANT
SUT VIC AB 4-0 PS2 27 (SUTURE) ×10 IMPLANT
SUT VICRYL 4-0 PS2 18IN ABS (SUTURE) IMPLANT
SUTURE E-PAK OPEN HEART (SUTURE) ×5 IMPLANT
SYR 50ML SLIP (SYRINGE) IMPLANT
SYSTEM SAHARA CHEST DRAIN ATS (WOUND CARE) ×5 IMPLANT
TAPE CLOTH SURG 4X10 WHT LF (GAUZE/BANDAGES/DRESSINGS) ×5 IMPLANT
TAPE PAPER 2X10 WHT MICROPORE (GAUZE/BANDAGES/DRESSINGS) ×5 IMPLANT
TOWEL GREEN STERILE (TOWEL DISPOSABLE) ×5 IMPLANT
TOWEL GREEN STERILE FF (TOWEL DISPOSABLE) ×5 IMPLANT
TOWEL OR 17X24 6PK STRL BLUE (TOWEL DISPOSABLE) ×10 IMPLANT
TOWEL OR 17X26 10 PK STRL BLUE (TOWEL DISPOSABLE) ×10 IMPLANT
TRAY FOLEY SILVER 16FR TEMP (SET/KITS/TRAYS/PACK) ×5 IMPLANT
TUBE FEEDING 8FR 16IN STR KANG (MISCELLANEOUS) ×5 IMPLANT
TUBING INSUFFLATION (TUBING) ×5 IMPLANT
UNDERPAD 30X30 (UNDERPADS AND DIAPERS) ×5 IMPLANT
WATER STERILE IRR 1000ML POUR (IV SOLUTION) ×10 IMPLANT

## 2017-07-30 NOTE — Anesthesia Procedure Notes (Signed)
Central Venous Catheter Insertion Performed by: Murvin Natal, anesthesiologist Start/End10/09/2017 7:15 AM, 07/30/2017 7:35 AM Patient location: Pre-op. Preanesthetic checklist: patient identified, IV checked, site marked, risks and benefits discussed, surgical consent, monitors and equipment checked, pre-op evaluation, timeout performed and anesthesia consent Position: Trendelenburg Lidocaine 1% used for infiltration and patient sedated Hand hygiene performed  and maximum sterile barriers used  Catheter size: 9 Fr Total catheter length 11. PA cath was placed.MAC introducer Swan type:thermodilution PA Cath depth:45 Procedure performed using ultrasound guided technique. Ultrasound Notes:anatomy identified, needle tip was noted to be adjacent to the nerve/plexus identified, no ultrasound evidence of intravascular and/or intraneural injection and image(s) printed for medical record Attempts: 2 Following insertion, line sutured, dressing applied and Biopatch. Post procedure assessment: blood return through all ports, free fluid flow and no air  Patient tolerated the procedure well with no immediate complications.

## 2017-07-30 NOTE — Op Note (Signed)
NAME:  YANELY, MAST NO.:  [id number]  MEDICAL RECORD NO.:  [id number]  LOCATION:  B19C                         FACILITY:  [facility]  PHYSICIAN:  Salvatore Decent. Dorris Fetch, M.D.DATE OF BIRTH:  [birth year]  DATE OF PROCEDURE:  07/30/2017 DATE OF DISCHARGE:                              OPERATIVE REPORT   PREOPERATIVE DIAGNOSIS:  Three-vessel coronary artery disease, status post non-ST-elevation myocardial infarction.  POSTOPERATIVE DIAGNOSIS:  Three-vessel coronary artery disease, status post non-ST-elevation myocardial infarction.  PROCEDURES:   Median sternotomy, extracorporeal circulation, Coronary artery bypass grafting x 4  Right internal mammary artery to posterior descending,  Saphenous vein graft to second diagonal  Sequential left radial artery graft to OM-1 and OM-2 (Y-graft off vein), and  Endoscopic vein harvest right thigh.  SURGEON:  Salvatore Decent. Dorris Fetch, MD.  ASSISTANTLowella Dandy, PA-C.  ANESTHESIA:  General.  FINDINGS:  Transesophageal echocardiography initially showed tricuspid regurgitation(which resolved after repositioning of Swan-Ganz catheter), preserved left ventricular function, and left ventricular hypertrophy.  No significant valvular pathology.  Good quality conduits, good quality targets.  CLINICAL NOTE:  Mrs. Solberg is a 51 year old woman with multiple cardiac risk factors, who presented with unstable chest pain.  She ruled in for a non-ST-elevation MI.  At catheterization, she was found to have 3-vessel coronary artery disease, but with relative sparing of the proximal LAD.  She was not a good candidate for percutaneous intervention and was referred for coronary artery bypass grafting.  The indications, risks, benefits, and alternatives were discussed in detail with the patient.  She understood and accepted the risks and agreed to proceed.  OPERATIVE NOTE:  Mrs. Spurling was brought to the preoperative holding area on  July 30, 2017.  Anesthesia placed a Swan-Ganz catheter and an arterial blood pressure monitoring line.  She was taken to the operating room, anesthetized, and intubated.  Intravenous antibiotics were administered.  A Foley catheter was placed.  Transesophageal echocardiography was performed.  The chest, abdomen, and legs were prepped and draped in usual sterile fashion.  The transesophageal echo revealed preserved left ventricular function with left ventricular hypertrophy.  No significant aortic or mitral valvular pathology. Initially, there was significant tricuspid regurgitation, but the Swan- Ganz catheter was coiled in the right atrium.  After repositioning the Swan-Ganz catheter into an appropriate place, there was not significant tricuspid regurgitation.  An incision was made over the volar aspect of the left wrist.  It was carried through the skin and subcutaneous tissue.  The fascia overlying the radial artery was incised.  A short segment of the radial artery was dissected out.  There was an excellent pulse distally with proximal occlusion, confirming the results of the preoperative Allen's test and vascular studies.  The incision was lengthened to just below the antecubital fossa.  The radial artery was harvested using the Harmonic Scalpel.  The proximal and distal ends of the radial were suture ligated with 4-0 Prolene sutures after removing the graft.  Hemostasis was achieved and the wound was closed in 2 layers.  Simultaneously with the radial artery harvest, an incision was made in the medial aspect of the right leg at the level of the knee.  The  greater saphenous vein was harvested endoscopically from the right thigh.  It was a good quality vessel.  A median sternotomy was performed.  Initial hemostasis was achieved. The right internal mammary artery was harvested using standard technique.  This was a large good quality mammary that had excellent flow when divided  distally.  The patient was fully heparinized.  A sternal retractor was placed.  The pericardium was opened.  There was a moderate serous pericardial effusion.  After confirming adequate anticoagulation with ACT measurement, the aorta was cannulated via concentric 2-0 Ethibond pledgeted pursestring sutures.  A dual-stage venous cannula was placed via pursestring suture in the right atrial appendage.  Cardiopulmonary bypass was initiated.  Flows were maintained per protocol.  The patient was cooled to 32 degrees Celsius.  The coronary arteries were inspected and anastomotic sites were chosen.  The conduits were inspected and cut to length.  It was determined that the right mammary did have sufficient length to reach the proximal posterior descending as a pedicle graft.  A foam pad was placed in the pericardium to insulate the heart.  A temperature probe was placed in myocardial septum and a cardioplegia cannula was placed in the ascending aorta.  The aorta was crossclamped.  The left ventricle was emptied via the aortic root vent.  Cardiac arrest then was achieved with a combination of cold antegrade blood cardioplegia and topical iced saline.  There was rapid septal cooling and rapid diastolic arrest.  A reversed saphenous vein graft was placed end-to-side to the second diagonal branch to the LAD.  This was a 1.5-mm, good quality vessel.  It was heavily diseased proximally.  The vein was anastomosed end-to-side with a running 7-0 Prolene suture.  A probe passed easily distally.  At the completion of the anastomosis, cardioplegia was administered.  There were good flow and good hemostasis.    The heart was elevated exposing the lateral wall.  The left radial artery then was grafted sequentially to obtuse marginals 1 and 2.  These were both large-caliber, good-quality vessels at the site of the anastomosis.  They both were heavily diseased proximally.  A probe passed easily distally.  A  side-to-side anastomosis was performed to OM-1 and end-to-side to OM-2.  Both were done with a running 8-0 Prolene sutures.  At completion of the graft, cardioplegia was administered antegrade and there was good backbleeding from the radial artery.  The right side of the pericardium was incised, taking care not to come into too close proximity with the phrenic nerve.  The distal end of the right mammary was beveled and it was anastomosed end-to-side to the posterior descending which was a 1.5-mm, good quality target vessel. This anastomosis was performed with a running 8-0 Prolene suture.  At the completion of the anastomosis, the bulldog clamp was removed.  There was good hemostasis.  The bulldog clamp was replaced and the mammary pedicle was tacked to the epicardial surface of the heart with 6-0 Prolene suture.  Rewarming was begun.  The cardioplegia cannula was removed from the ascending aorta.  The vein graft was cut to length.  The proximal anastomosis for the vein to the diagonal was performed to a 4.5-mm punch aortotomy with a running 6-0 Prolene suture.  At the completion of the proximal anastomosis, the patient was placed in Trendelenburg position. Lidocaine was administered.  The aortic root was de-aired and the aortic crossclamp was removed.  The total crossclamp time was 54 minutes.  The patient spontaneously resumed sinus  rhythm and did not require defibrillation.  Bulldog clamps were placed proximally and distally on the vein to the diagonal.  The radial did not quite have sufficient length to reach to the aorta directly without being under tension.  A venotomy was made and the proximal anastomosis for the radial was performed to the saphenous vein with a running 7-0 Prolene suture.  The anastomosis was de-aired before tying the suture.  All proximal and distal anastomoses were inspected for hemostasis.  Epicardial pacing wires were placed on the right ventricle and  right atrium.  When the patient had rewarmed to a core temperature of 37 degrees Celsius, she was weaned from cardiopulmonary bypass on the first attempt.  She was in sinus rhythm and on no inotropic support at the time of separation from bypass.  Total bypass time was 106 minutes.  The Swan-Ganz catheter was repositioned into the pulmonary artery prior to weaning from bypass.  Post- bypass transesophageal echocardiography was unchanged from the prebypass study other than the improvement in the tricuspid regurgitation previously noted.  A test dose of protamine was administered and it was well tolerated. The atrial and aortic cannulae were removed.  The remainder of the protamine was administered without incident.  The chest was irrigated with warm saline.  Hemostasis was achieved.  Right pleural and mediastinal chest tubes were placed through separate subcostal incisions.  The pericardium was reapproximated over the ascending aorta with interrupted 3-0 silk sutures.  The pericardium was not closed over the heart to avoid tension on the right mammary artery.  The sternum was closed with a combination of single and double heavy gauge stainless steel wires.  The pectoralis fascia, subcutaneous tissue, and skin were closed in a standard fashion.  All sponge, needle, and instrument counts were correct at the end of the procedure.  The patient was taken from the operating room to the surgical intensive care unit intubated and in good condition.     Salvatore Decent Dorris Fetch, M.D.     SCH/MEDQ  D:  07/30/2017  T:  07/30/2017  Job:  [id number]

## 2017-07-30 NOTE — Brief Op Note (Addendum)
07/28/2017 - 07/30/2017  12:03 PM  PATIENT:  [name] [name]  51 y.o. female  PRE-OPERATIVE DIAGNOSIS:  3 vessel CAD s/p NSTEMI  POST-OPERATIVE DIAGNOSIS:  3 vessel CAD s/p NSTEMI  PROCEDURE:  Procedure(s):  CORONARY ARTERY BYPASS GRAFTING x 4 -RIMA to PDA -SVG to DIAGONAL 2 - SEQ LEFT RADIAL ARTERY TO OM 1 and OM 2 (Y graft from vein)  RADIAL ARTERY HARVEST (Left)  ENDOSCOPIC HARVEST GREATER SAPHENOUS VEIN -Right Thigh  INTRAOPERATIVE TRANSESOPHAGEAL ECHOCARDIOGRAM (N/A)  SURGEON:  Surgeon(s) and Role:    * Loreli Slot, MD - Primary  PHYSICIAN ASSISTANT: Lowella Dandy PA-C  ANESTHESIA:   general  EBL:  Total I/O In: 1600 [I.V.:1600] Out: 460 [Urine:460]  BLOOD ADMINISTERED: CELLSAVER  DRAINS: Right Pleural Chest Tubes, Mediastinal Chest Drains   LOCAL MEDICATIONS USED:  NONE  SPECIMEN:  No Specimen  DISPOSITION OF SPECIMEN:  N/A  COUNTS:  YES  TOURNIQUET:  * No tourniquets in log *  DICTATION: .Dragon Dictation  PLAN OF CARE: Admit to inpatient   PATIENT DISPOSITION:  ICU - intubated and hemodynamically stable.   Delay start of Pharmacological VTE agent (>24hrs) due to surgical blood loss or risk of bleeding: yes   XC= 54 min CPB= 106 min Good targets and conduits

## 2017-07-30 NOTE — Progress Notes (Signed)
ANTICOAGULATION CONSULT NOTE - Follow Up Consult  Pharmacy Consult for Heparin  Indication: Awaiting CABG this AM  Allergies  Allergen Reactions  . Penicillins Hives    Patient Measurements: Height: 5' (152.4 cm) Weight: 186 lb (84.4 kg) IBW/kg (Calculated) : 45.5  Vital Signs: Temp: 98.4 F (36.9 C) (10/11 2339) Temp Source: Oral (10/11 2339) BP: 121/79 (10/11 2339) Pulse Rate: 95 (10/11 2339)  Labs:  Recent Labs  07/28/17 1734 07/28/17 1945 07/28/17 2210 07/29/17 0222 07/29/17 0805 07/30/17 0051 07/30/17 0052  HGB 12.9  --   --  11.7*  --   --  11.7*  HCT 40.4  --   --  36.4  --   --  36.1  PLT 348  --   --  298  --   --  322  APTT  --  37*  --   --   --   --   --   LABPROT  --  13.2  --  14.4  --   --   --   INR  --  1.01  --  1.13  --   --   --   HEPARINUNFRC  --   --   --  0.36 0.35 0.13*  --   CREATININE 0.84  --   --  0.78  --   --   --   TROPONINI  --  4.24* 4.45* 3.97*  --   --   --     Estimated Creatinine Clearance: 80.2 mL/min (by C-G formula based on SCr of 0.78 mg/dL).   Assessment: Heparin for multi-vessel disease, awaiting CABG this AM, heparin level low, no issus per RN.   Goal of Therapy:  Heparin level 0.3-0.7 units/ml Monitor platelets by anticoagulation protocol: Yes   Plan:  -Inc heparin to 950 units/hr -F/U post-CABG  Abran Duke 07/30/2017,1:45 AM

## 2017-07-30 NOTE — Care Management Note (Signed)
Case Management Note  Patient Details  Name: [name] [name] MRN: [id number] Date of Birth: [birth year]  Subjective/Objective:  From home with spouse, post op CABG, husband works at night and home during the day, but mom states she will be able to assist patient at home because she is retired.  Spouse states patient has PCP and medication coverage.                     Action/Plan: NCM will follow for dc needs.   Expected Discharge Date:  07/30/17               Expected Discharge Plan:     In-House Referral:     Discharge planning Services  CM Consult  Post Acute Care Choice:    Choice offered to:     DME Arranged:    DME Agency:     HH Arranged:    HH Agency:     Status of Service:  In process, will continue to follow  If discussed at Long Length of Stay Meetings, dates discussed:    Additional Comments:  Leone Haven, RN 07/30/2017, 3:45 PM

## 2017-07-30 NOTE — Progress Notes (Signed)
Attempted to wean per SICU protocol.  Pt became tachypnic RR 40's and desat to 82%.  Pt placed back on full vent support/previous settings.  RN in room and aware.

## 2017-07-30 NOTE — Interval H&P Note (Signed)
History and Physical Interval Note:  07/30/2017 7:21 AM  [name] [name]  has presented today for surgery, with the diagnosis of CAD  The various methods of treatment have been discussed with the patient and family. After consideration of risks, benefits and other options for treatment, the patient has consented to  Procedure(s): CORONARY ARTERY BYPASS GRAFTING (CABG) (N/A) RADIAL ARTERY HARVEST (Left) INTRAOPERATIVE TRANSESOPHAGEAL ECHOCARDIOGRAM (N/A) as a surgical intervention .  The patient's history has been reviewed, patient examined, no change in status, stable for surgery.  I have reviewed the patient's chart and labs.  Questions were answered to the patient's satisfaction.     Loreli Slot

## 2017-07-30 NOTE — Anesthesia Procedure Notes (Signed)
Arterial Line Insertion Start/End10/09/2017 6:45 AM, 07/30/2017 6:50 AM Performed by: Annabelle Harman A, CRNA  Lidocaine 1% used for infiltration Right, radial was placed Catheter size: 20 G Hand hygiene performed , maximum sterile barriers used  and Seldinger technique used Allen's test indicative of satisfactory collateral circulation Attempts: 1 Procedure performed without using ultrasound guided technique. Following insertion, dressing applied and Biopatch. Post procedure assessment: normal and unchanged  Patient tolerated the procedure well with no immediate complications.

## 2017-07-30 NOTE — Progress Notes (Signed)
      [address]       [place] [address]             [phone]      Intubated, sedated at present was agitated a little while ago  BP (!) 88/64   Pulse 82   Temp 97.9 F (36.6 C)   Resp (!) 24   Ht 5' (1.524 m)   Wt 185 lb 13.6 oz (84.3 kg)   SpO2 100%   BMI 36.30 kg/m   Intake/Output Summary (Last 24 hours) at 07/30/17 1727 Last data filed at 07/30/17 1700  Gross per 24 hour  Intake          4951.03 ml  Output             2215 ml  Net          2736.03 ml   PA 28/17, CI= 2.2 on dopamine at 3 Minimal CT output  Doing well early postop  Viviann Spare C. Dorris Fetch, MD [facility] [phone]

## 2017-07-30 NOTE — Anesthesia Procedure Notes (Signed)
Procedure Name: Intubation Date/Time: 07/30/2017 8:12 AM Performed by: Annabelle Harman A Pre-anesthesia Checklist: Patient identified, Emergency Drugs available, Suction available and Patient being monitored Patient Re-evaluated:Patient Re-evaluated prior to induction Oxygen Delivery Method: Circle system utilized Preoxygenation: Pre-oxygenation with 100% oxygen Induction Type: IV induction Ventilation: Mask ventilation without difficulty and Oral airway inserted - appropriate to patient size Laryngoscope Size: Hyacinth Meeker and 2 Grade View: Grade II Tube type: Oral Tube size: 7.5 mm Number of attempts: 1 Airway Equipment and Method: Stylet Placement Confirmation: ETT inserted through vocal cords under direct vision,  positive ETCO2 and breath sounds checked- equal and bilateral Secured at: 22 cm Tube secured with: Tape Dental Injury: Teeth and Oropharynx as per pre-operative assessment

## 2017-07-30 NOTE — Procedures (Signed)
Extubation Procedure Note  Patient Details:   Name: [name] [name] DOB: [birth year] MRN: [id number]   Airway Documentation:  Airway 7.5 mm (Active)  Secured at (cm) 22 cm 07/30/2017  7:47 PM  Measured From Lips 07/30/2017  7:47 PM  Secured Location Right 07/30/2017  7:47 PM  Secured By Pink Tape 07/30/2017  7:47 PM  Site Condition Dry 07/30/2017  2:00 PM    Evaluation  O2 sats: stable throughout Complications: No apparent complications Patient did tolerate procedure well. Bilateral Breath Sounds: Clear, Diminished     NIF -25, VC 1.2L, good patient effort.  Patient alert and oriented, positive air leak with cuff deflated.  Patient extubated to 4L nasal cannula.  Able to vocalize.   Yes  Donnelly Angelica 07/30/2017, 9:17 PM

## 2017-07-30 NOTE — Progress Notes (Signed)
  Echocardiogram Echocardiogram Transesophageal has been performed.  Zabrina Brotherton T Dezire Turk 07/30/2017, 8:49 AM

## 2017-07-30 NOTE — Progress Notes (Signed)
Attempted weaning parameters/mechanics.  Pt unable to do VC, NIF -20 (with much coaching and multiple efforts needed).  RR 40's, also pt desat 88%.  Pt placed back on full vent support.

## 2017-07-30 NOTE — Progress Notes (Signed)
RN called d/t pt w/ acidotic ABG- RN paging MD.  Set vent rate increased to 18.  MD aware, received no new RT orders currently. Pt tol vent change well currently.

## 2017-07-30 NOTE — H&P (View-Only) (Signed)
Reason for Consult:3 vessel CAD s/p non STEMI Referring Physician: Dr. Martinique  [name] [name] is an 51 y.o. female.  HPI: 51 yo woman who presents with a cc/o CP radiating to left arm.  [name] [name] is a 51 yo woman with a past history of type II diabetes without complication, hypertension, hyperlipidemia and obesity. She is a life long nonsmoker. She does have a family history of CAD. She was in her usual state of health until about 3 weeks ago when she began experiencing a pressure in her chest and tingling in her left arm. Pain was exacerbated by exertion. On the day of admission she had an episode after walking up a flight of stairs. Her family convinced her to go to her primary, Dr. Felipa Eth. He sent her to the ED. Her initial troponin was elevated at 3.86. ECG showed subtle ST depression. She was admitted with a non STEMI. She is currently pain free.   At catheterization this morning she was found to have 3 vessel CAD with severe disease in the RCA, circumflex and a diagonal branch. The proximal LAD was spared but she had severe disease distally. EF was ~ 60%  Past Medical History:  Diagnosis Date  . Diabetes mellitus without complication ([place])   . Hyperlipidemia   . Hypertension   . Obesity     Past Surgical History:  Procedure Laterality Date  . CESAREAN SECTION    . PARTIAL HYSTERECTOMY  2005    Family History  Problem Relation Age of Onset  . Hypertension Other     Social History:  reports that she has never smoked. She has never used smokeless tobacco. She reports that she drinks alcohol. She reports that she does not use drugs.  Allergies:  Allergies  Allergen Reactions  . Penicillins Hives    Medications:  Scheduled: . amLODipine  10 mg Oral Daily  . aspirin EC  81 mg Oral Daily  . atorvastatin  80 mg Oral q1800  . Influenza vac split quadrivalent PF  0.5 mL Intramuscular Tomorrow-1000  . insulin aspart  0-15 Units Subcutaneous TID WC  . losartan  50 mg  Oral Daily  . metoprolol tartrate  12.5 mg Oral BID  . sodium chloride flush  3 mL Intravenous Q12H    Results for orders placed or performed during the hospital encounter of 07/28/17 (from the past 48 hour(s))  Basic metabolic panel     Status: Abnormal   Collection Time: 07/28/17  5:34 PM  Result Value Ref Range   Sodium 138 135 - 145 mmol/L   Potassium 3.8 3.5 - 5.1 mmol/L   Chloride 105 101 - 111 mmol/L   CO2 22 22 - 32 mmol/L   Glucose, Bld 116 (H) 65 - 99 mg/dL   BUN 23 (H) 6 - 20 mg/dL   Creatinine, Ser 0.84 0.44 - 1.00 mg/dL   Calcium 10.8 (H) 8.9 - 10.3 mg/dL   GFR calc non Af Amer >60 >60 mL/min   GFR calc Af Amer >60 >60 mL/min    Comment: (NOTE) The eGFR has been calculated using the CKD EPI equation. This calculation has not been validated in all clinical situations. eGFR's persistently <60 mL/min signify possible Chronic Kidney Disease.    Anion gap 11 5 - 15  CBC     Status: Abnormal   Collection Time: 07/28/17  5:34 PM  Result Value Ref Range   WBC 12.6 (H) 4.0 - 10.5 K/uL   RBC 5.11 3.87 -  5.11 MIL/uL   Hemoglobin 12.9 12.0 - 15.0 g/dL   HCT 40.4 36.0 - 46.0 %   MCV 79.1 78.0 - 100.0 fL   MCH 25.2 (L) 26.0 - 34.0 pg   MCHC 31.9 30.0 - 36.0 g/dL   RDW 14.4 11.5 - 15.5 %   Platelets 348 150 - 400 K/uL  I-stat troponin, ED     Status: Abnormal   Collection Time: 07/28/17  6:01 PM  Result Value Ref Range   Troponin i, poc 3.86 (HH) 0.00 - 0.08 ng/mL   Comment NOTIFIED PHYSICIAN    Comment 3            Comment: Due to the release kinetics of cTnI, a negative result within the first hours of the onset of symptoms does not rule out myocardial infarction with certainty. If myocardial infarction is still suspected, repeat the test at appropriate intervals.   I-Stat beta hCG blood, ED     Status: None   Collection Time: 07/28/17  7:19 PM  Result Value Ref Range   I-stat hCG, quantitative <5.0 <5 mIU/mL   Comment 3            Comment:   GEST. AGE       CONC.  (mIU/mL)   <=1 WEEK        5 - 50     2 WEEKS       50 - 500     3 WEEKS       100 - 10,000     4 WEEKS     1,000 - 30,000        FEMALE AND NON-PREGNANT FEMALE:     LESS THAN 5 mIU/mL   Protime-INR     Status: None   Collection Time: 07/28/17  7:45 PM  Result Value Ref Range   Prothrombin Time 13.2 11.4 - 15.2 seconds   INR 1.01   APTT     Status: Abnormal   Collection Time: 07/28/17  7:45 PM  Result Value Ref Range   aPTT 37 (H) 24 - 36 seconds    Comment:        IF BASELINE aPTT IS ELEVATED, SUGGEST PATIENT RISK ASSESSMENT BE USED TO DETERMINE APPROPRIATE ANTICOAGULANT THERAPY.   Troponin I     Status: Abnormal   Collection Time: 07/28/17  7:45 PM  Result Value Ref Range   Troponin I 4.24 (HH) <0.03 ng/mL    Comment: CRITICAL RESULT CALLED TO, READ BACK BY AND VERIFIED WITH: BAIN C,RN 07/28/17 2110 WAYK   Lipid panel     Status: Abnormal   Collection Time: 07/28/17  7:45 PM  Result Value Ref Range   Cholesterol 237 (H) 0 - 200 mg/dL   Triglycerides 169 (H) <150 mg/dL   HDL 36 (L) >40 mg/dL   Total CHOL/HDL Ratio 6.6 RATIO   VLDL 34 0 - 40 mg/dL   LDL Cholesterol 167 (H) 0 - 99 mg/dL    Comment:        Total Cholesterol/HDL:CHD Risk Coronary Heart Disease Risk Table                     Men   Women  1/2 Average Risk   3.4   3.3  Average Risk       5.0   4.4  2 X Average Risk   9.6   7.1  3 X Average Risk  23.4   11.0  Use the calculated Patient Ratio above and the CHD Risk Table to determine the patient's CHD Risk.        ATP III CLASSIFICATION (LDL):  <100     mg/dL   Optimal  100-129  mg/dL   Near or Above                    Optimal  130-159  mg/dL   Borderline  160-189  mg/dL   High  >190     mg/dL   Very High   Urinalysis, Routine w reflex microscopic     Status: Abnormal   Collection Time: 07/28/17  9:00 PM  Result Value Ref Range   Color, Urine YELLOW YELLOW   APPearance CLEAR CLEAR   Specific Gravity, Urine 1.018 1.005 - 1.030   pH  5.0 5.0 - 8.0   Glucose, UA NEGATIVE NEGATIVE mg/dL   Hgb urine dipstick NEGATIVE NEGATIVE   Bilirubin Urine NEGATIVE NEGATIVE   Ketones, ur 5 (A) NEGATIVE mg/dL   Protein, ur NEGATIVE NEGATIVE mg/dL   Nitrite NEGATIVE NEGATIVE   Leukocytes, UA MODERATE (A) NEGATIVE   RBC / HPF 0-5 0 - 5 RBC/hpf   WBC, UA 6-30 0 - 5 WBC/hpf   Bacteria, UA RARE (A) NONE SEEN   Squamous Epithelial / LPF 0-5 (A) NONE SEEN   Mucus PRESENT    Hyaline Casts, UA PRESENT    Uric Acid Crys, UA PRESENT   Troponin I     Status: Abnormal   Collection Time: 07/28/17 10:10 PM  Result Value Ref Range   Troponin I 4.45 (HH) <0.03 ng/mL    Comment: CRITICAL VALUE NOTED.  VALUE IS CONSISTENT WITH PREVIOUSLY REPORTED AND CALLED VALUE.  MRSA PCR Screening     Status: None   Collection Time: 07/28/17 11:43 PM  Result Value Ref Range   MRSA by PCR NEGATIVE NEGATIVE    Comment:        The GeneXpert MRSA Assay (FDA approved for NASAL specimens only), is one component of a comprehensive MRSA colonization surveillance program. It is not intended to diagnose MRSA infection nor to guide or monitor treatment for MRSA infections.   Heparin level (unfractionated)     Status: None   Collection Time: 07/29/17  2:22 AM  Result Value Ref Range   Heparin Unfractionated 0.36 0.30 - 0.70 IU/mL    Comment:        IF HEPARIN RESULTS ARE BELOW EXPECTED VALUES, AND PATIENT DOSAGE HAS BEEN CONFIRMED, SUGGEST FOLLOW UP TESTING OF ANTITHROMBIN III LEVELS.   Basic metabolic panel     Status: Abnormal   Collection Time: 07/29/17  2:22 AM  Result Value Ref Range   Sodium 136 135 - 145 mmol/L   Potassium 3.5 3.5 - 5.1 mmol/L   Chloride 106 101 - 111 mmol/L   CO2 24 22 - 32 mmol/L   Glucose, Bld 194 (H) 65 - 99 mg/dL   BUN 17 6 - 20 mg/dL   Creatinine, Ser 0.78 0.44 - 1.00 mg/dL   Calcium 9.9 8.9 - 10.3 mg/dL   GFR calc non Af Amer >60 >60 mL/min   GFR calc Af Amer >60 >60 mL/min    Comment: (NOTE) The eGFR has been  calculated using the CKD EPI equation. This calculation has not been validated in all clinical situations. eGFR's persistently <60 mL/min signify possible Chronic Kidney Disease.    Anion gap 6 5 - 15  CBC     Status: Abnormal     Collection Time: 07/29/17  2:22 AM  Result Value Ref Range   WBC 11.3 (H) 4.0 - 10.5 K/uL   RBC 4.61 3.87 - 5.11 MIL/uL   Hemoglobin 11.7 (L) 12.0 - 15.0 g/dL   HCT 36.4 36.0 - 46.0 %   MCV 79.0 78.0 - 100.0 fL   MCH 25.4 (L) 26.0 - 34.0 pg   MCHC 32.1 30.0 - 36.0 g/dL   RDW 14.2 11.5 - 15.5 %   Platelets 298 150 - 400 K/uL  Protime-INR     Status: None   Collection Time: 07/29/17  2:22 AM  Result Value Ref Range   Prothrombin Time 14.4 11.4 - 15.2 seconds   INR 1.13   Troponin I     Status: Abnormal   Collection Time: 07/29/17  2:22 AM  Result Value Ref Range   Troponin I 3.97 (HH) <0.03 ng/mL    Comment: CRITICAL VALUE NOTED.  VALUE IS CONSISTENT WITH PREVIOUSLY REPORTED AND CALLED VALUE.  Heparin level (unfractionated)     Status: None   Collection Time: 07/29/17  8:05 AM  Result Value Ref Range   Heparin Unfractionated 0.35 0.30 - 0.70 IU/mL    Comment:        IF HEPARIN RESULTS ARE BELOW EXPECTED VALUES, AND PATIENT DOSAGE HAS BEEN CONFIRMED, SUGGEST FOLLOW UP TESTING OF ANTITHROMBIN III LEVELS.   Glucose, capillary     Status: Abnormal   Collection Time: 07/29/17 11:28 AM  Result Value Ref Range   Glucose-Capillary 148 (H) 65 - 99 mg/dL   Comment 1 Notify RN     Dg Chest 2 View  Result Date: 07/28/2017 CLINICAL DATA:  Midsternal chest pain extending down the left upper extremity, several days duration. EXAM: CHEST  2 VIEW COMPARISON:  05/14/2004. FINDINGS: Unchanged borderline cardiomegaly. The lungs are clear. The pulmonary vasculature is normal. There is no pleural effusion. Hilar and mediastinal contours are unremarkable and unchanged. IMPRESSION: Stable borderline cardiomegaly.  No consolidation or effusion. Electronically Signed    By: Daniel R Mitchell M.D.   On: 07/28/2017 18:22    Review of Systems  Constitutional: Positive for malaise/fatigue. Negative for chills and fever.  HENT: Negative for hearing loss.   Eyes: Negative for blurred vision and double vision.  Respiratory: Positive for shortness of breath.   Cardiovascular: Positive for chest pain and leg swelling. Negative for palpitations and claudication.  Gastrointestinal: Positive for heartburn. Negative for nausea and vomiting.  Genitourinary: Negative for dysuria and urgency.  Musculoskeletal: Negative for joint pain and myalgias.  Neurological: Negative for focal weakness and loss of consciousness.  Endo/Heme/Allergies: Does not bruise/bleed easily.  Psychiatric/Behavioral: Negative for depression. The patient is not nervous/anxious.   All other systems reviewed and are negative.  Blood pressure 123/88, pulse 98, temperature 98.5 F (36.9 C), temperature source Axillary, resp. rate 16, height 5' (1.524 m), weight 186 lb (84.4 kg), SpO2 92 %. Physical Exam  Vitals reviewed. Constitutional: She is oriented to person, place, and time. No distress.  obese  HENT:  Head: Normocephalic and atraumatic.  Eyes: Conjunctivae and EOM are normal. No scleral icterus.  Neck: Neck supple. No thyromegaly present.  No carotid bruit  Cardiovascular: Normal rate, regular rhythm, normal heart sounds and intact distal pulses.  Exam reveals no gallop and no friction rub.   No murmur heard. Normal Allen's test on left  Respiratory: Effort normal and breath sounds normal. No respiratory distress. She has no wheezes. She has no rales.  GI: She exhibits no distension.   There is no tenderness.  Musculoskeletal: She exhibits no edema or deformity.  Lymphadenopathy:    She has no cervical adenopathy.  Neurological: She is alert and oriented to person, place, and time. No cranial nerve deficit. She exhibits normal muscle tone. Coordination normal.  Skin: Skin is warm and  dry.   Cardiac Catheterization Conclusion     1st Diag lesion, 70 %stenosed.  2nd Diag lesion, 90 %stenosed.  Dist LAD lesion, 95 %stenosed.  1st Mrg lesion, 90 %stenosed.  Ost 2nd Mrg to 2nd Mrg lesion, 100 %stenosed.  Mid Cx to Dist Cx lesion, 80 %stenosed.  Mid RCA lesion, 75 %stenosed.  Dist RCA lesion, 95 %stenosed.  The left ventricular systolic function is normal.  LV end diastolic pressure is mildly elevated.  The left ventricular ejection fraction is 55-65% by visual estimate.   1. Severe 3 vessel obstructive CAD 2. Good LV function 3. Mildly elevated LVEDP  Plan: I reviewed angiograms with Dr. Gwenlyn Found. She has severe multivessel CAD with high Syntax score. There is sparing of the proximal to mid LAD. The culprit appears to the the second OM. Overall LV function is good. The question is whether she would be best managed with an aggressive PCI approach versus CABG. The first and second OMs, distal LCx, mid and distal RCA could be treated percutaneously but would require extensive stenting. I think a complete discussion with a multidisciplinary approach is needed. Will consult CT surgery. Continue aggressive medical therapy until decision made concerning revascularization.    I personally reviewed the cath images and concur with the findings noted above  Assessment/Plan: 51 yo woman with multiple CRF including type II diabetes, HTN and hyperlipidemia presents with an unstable coronary syndrome and ruled in for a NSTEMI. At cath has severe 3 vessel CAD with preserved LV function.  She has relatively small vessels and diabetes and would require multiple stents for PCI. I think her best option is CABG. I discussed this with the patient, her husband and Dr. Martinique.  I think the best option would be to place the RIMA to the distal RCA, a radial to the OM branches and a saphenous vein to the diagonal. The LAD is diseased beyond where it would be graftable. She has a normal  Allen's test on the left. Cannot examine the right radial at present since she was cathed through that vessel.  I discussed the general nature of the procedure, the need for general anesthesia, the use of cardiopulmonary bypass, and the incisions to be used with [name] [name] and her huband. We discussed the expected hospital stay, overall recovery and short and long term outcomes. I informed them of the indications, risks, benefits and alternatives. They understand the risks include, but are not limited to death, stroke, MI, DVT/PE, bleeding, possible need for transfusion, infections, cardiac arrhythmias, as well as other organ system dysfunction including respiratory, renal, or GI complications.   She wishes to think about the operation before deciding whether to proceed. Will go ahead and place on OR schedule to reserve a spot.  07/29/2017, 1:00 PM   Revonda Standard. Roxan Hockey, MD [facility] [phone]

## 2017-07-30 NOTE — Progress Notes (Signed)
Re-attempting SICU vent wean protocol.

## 2017-07-30 NOTE — OR Nursing (Signed)
12:55 - 45 minute call to SICU charge nurse 13:30 - 20 minute call to SICU charge nurse 

## 2017-07-30 NOTE — Anesthesia Postprocedure Evaluation (Signed)
Anesthesia Post Note  Patient: [name] [name]  Procedure(s) Performed: CORONARY ARTERY BYPASS GRAFTING (CABG)  x four, using right internal mammary artery, right greater saphenous vein, left radial artery (N/A Chest) RADIAL ARTERY HARVEST (Left ) INTRAOPERATIVE TRANSESOPHAGEAL ECHOCARDIOGRAM (N/A )     Patient location during evaluation: SICU Anesthesia Type: General Level of consciousness: sedated Pain management: pain level controlled Vital Signs Assessment: post-procedure vital signs reviewed and stable Respiratory status: patient remains intubated per anesthesia plan Cardiovascular status: stable Postop Assessment: no apparent nausea or vomiting Anesthetic complications: no    Last Vitals:  Vitals:   07/30/17 1715 07/30/17 1720  BP:    Pulse: 82   Resp: (!) 24   Temp: 36.6 C   SpO2: 100% 100%    Last Pain:  Vitals:   07/30/17 1415  TempSrc: Core (Comment)  PainSc:                  Catheryn Bacon Ellender

## 2017-07-30 NOTE — Transfer of Care (Signed)
Immediate Anesthesia Transfer of Care Note  Patient: [name] [name]  Procedure(s) Performed: CORONARY ARTERY BYPASS GRAFTING (CABG)  x four, using right internal mammary artery, right greater saphenous vein, left radial artery (N/A Chest) RADIAL ARTERY HARVEST (Left ) INTRAOPERATIVE TRANSESOPHAGEAL ECHOCARDIOGRAM (N/A )  Patient Location: ICU  Anesthesia Type:General  Level of Consciousness: sedated and Patient remains intubated per anesthesia plan  Airway & Oxygen Therapy: Patient remains intubated per anesthesia plan and Patient placed on Ventilator (see vital sign flow sheet for setting)  Post-op Assessment: Report given to RN and Post -op Vital signs reviewed and stable  Post vital signs: Reviewed and stable  Last Vitals:  Vitals:   07/30/17 0357 07/30/17 0521  BP: 125/87 123/81  Pulse: 98 (!) 102  Resp: 20   Temp: 36.8 C 36.9 C  SpO2: 94% 93%    Last Pain:  Vitals:   07/30/17 0521  TempSrc: Oral  PainSc: 0-No pain      Patients Stated Pain Goal: 0 (07/29/17 0454)  Complications: No apparent anesthesia complications

## 2017-07-31 ENCOUNTER — Inpatient Hospital Stay (HOSPITAL_COMMUNITY): Payer: 59

## 2017-07-31 LAB — GLUCOSE, CAPILLARY
GLUCOSE-CAPILLARY: 114 mg/dL — AB (ref 65–99)
GLUCOSE-CAPILLARY: 119 mg/dL — AB (ref 65–99)
GLUCOSE-CAPILLARY: 126 mg/dL — AB (ref 65–99)
GLUCOSE-CAPILLARY: 141 mg/dL — AB (ref 65–99)
GLUCOSE-CAPILLARY: 142 mg/dL — AB (ref 65–99)
GLUCOSE-CAPILLARY: 146 mg/dL — AB (ref 65–99)
GLUCOSE-CAPILLARY: 153 mg/dL — AB (ref 65–99)
Glucose-Capillary: 102 mg/dL — ABNORMAL HIGH (ref 65–99)
Glucose-Capillary: 110 mg/dL — ABNORMAL HIGH (ref 65–99)
Glucose-Capillary: 138 mg/dL — ABNORMAL HIGH (ref 65–99)
Glucose-Capillary: 148 mg/dL — ABNORMAL HIGH (ref 65–99)
Glucose-Capillary: 152 mg/dL — ABNORMAL HIGH (ref 65–99)
Glucose-Capillary: 154 mg/dL — ABNORMAL HIGH (ref 65–99)
Glucose-Capillary: 174 mg/dL — ABNORMAL HIGH (ref 65–99)
Glucose-Capillary: 64 mg/dL — ABNORMAL LOW (ref 65–99)
Glucose-Capillary: 93 mg/dL (ref 65–99)
Glucose-Capillary: 97 mg/dL (ref 65–99)

## 2017-07-31 LAB — CBC
HCT: 24.1 % — ABNORMAL LOW (ref 36.0–46.0)
HCT: 23 % — ABNORMAL LOW (ref 36.0–46.0)
Hemoglobin: 7.8 g/dL — ABNORMAL LOW (ref 12.0–15.0)
Hemoglobin: 7.4 g/dL — ABNORMAL LOW (ref 12.0–15.0)
MCH: 25.2 pg — AB (ref 26.0–34.0)
MCH: 25.3 pg — ABNORMAL LOW (ref 26.0–34.0)
MCHC: 32.4 g/dL (ref 30.0–36.0)
MCHC: 32.2 g/dL (ref 30.0–36.0)
MCV: 77.7 fL — ABNORMAL LOW (ref 78.0–100.0)
MCV: 78.5 fL (ref 78.0–100.0)
PLATELETS: 158 10*3/uL (ref 150–400)
PLATELETS: 166 10*3/uL (ref 150–400)
RBC: 3.1 MIL/uL — AB (ref 3.87–5.11)
RBC: 2.93 MIL/uL — AB (ref 3.87–5.11)
RDW: 14.4 % (ref 11.5–15.5)
RDW: 14.7 % (ref 11.5–15.5)
WBC: 10.1 10*3/uL (ref 4.0–10.5)
WBC: 12.1 10*3/uL — AB (ref 4.0–10.5)

## 2017-07-31 LAB — BASIC METABOLIC PANEL
Anion gap: 9 (ref 5–15)
CO2: 23 mmol/L (ref 22–32)
Calcium: 9.4 mg/dL (ref 8.9–10.3)
Chloride: 107 mmol/L (ref 101–111)
Creatinine, Ser: 0.56 mg/dL (ref 0.44–1.00)
GFR calc Af Amer: >60 mL/min (ref >60)
GFR calc non Af Amer: >60 mL/min (ref >60)
Glucose, Bld: 139 mg/dL — ABNORMAL HIGH (ref 65–99)
POTASSIUM: 3.3 mmol/L — AB (ref 3.5–5.1)
SODIUM: 139 mmol/L (ref 135–145)

## 2017-07-31 LAB — MAGNESIUM
MAGNESIUM: 1.9 mg/dL (ref 1.7–2.4)
MAGNESIUM: 2.5 mg/dL — AB (ref 1.7–2.4)

## 2017-07-31 LAB — POCT I-STAT, CHEM 8
BUN: 7 mg/dL (ref 6–20)
CALCIUM ION: 1.31 mmol/L (ref 1.15–1.40)
CHLORIDE: 100 mmol/L — AB (ref 101–111)
CREATININE: 0.6 mg/dL (ref 0.44–1.00)
GLUCOSE: 153 mg/dL — AB (ref 65–99)
HCT: 24 % — ABNORMAL LOW (ref 36.0–46.0)
Hemoglobin: 8.2 g/dL — ABNORMAL LOW (ref 12.0–15.0)
Potassium: 4.6 mmol/L (ref 3.5–5.1)
Sodium: 137 mmol/L (ref 135–145)
TCO2: 24 mmol/L (ref 22–32)

## 2017-07-31 LAB — CREATININE, SERUM: CREATININE: 0.62 mg/dL (ref 0.44–1.00)

## 2017-07-31 MED ORDER — FUROSEMIDE 10 MG/ML IJ SOLN
20.0000 mg | Freq: Once | INTRAMUSCULAR | Status: AC
  Administered 2017-07-31: 20 mg via INTRAVENOUS
  Filled 2017-07-31: qty 2

## 2017-07-31 MED ORDER — POTASSIUM CHLORIDE 10 MEQ/50ML IV SOLN
10.0000 meq | INTRAVENOUS | Status: DC | PRN
  Administered 2017-07-31: 10 meq via INTRAVENOUS
  Filled 2017-07-31: qty 50

## 2017-07-31 MED ORDER — POTASSIUM CHLORIDE 10 MEQ/50ML IV SOLN
10.0000 meq | INTRAVENOUS | Status: AC
  Administered 2017-07-31 (×3): 10 meq via INTRAVENOUS
  Filled 2017-07-31 (×3): qty 50

## 2017-07-31 MED ORDER — METFORMIN HCL 500 MG PO TABS
1000.0000 mg | ORAL_TABLET | Freq: Two times a day (BID) | ORAL | Status: DC
  Administered 2017-07-31 – 2017-08-02 (×5): 1000 mg via ORAL
  Filled 2017-07-31 (×5): qty 2

## 2017-07-31 MED ORDER — POTASSIUM CHLORIDE 10 MEQ/50ML IV SOLN
10.0000 meq | INTRAVENOUS | Status: AC
  Administered 2017-07-31 (×2): 10 meq via INTRAVENOUS
  Filled 2017-07-31 (×2): qty 50

## 2017-07-31 MED ORDER — POTASSIUM CHLORIDE 10 MEQ/50ML IV SOLN
10.0000 meq | INTRAVENOUS | Status: DC | PRN
  Filled 2017-07-31: qty 50

## 2017-07-31 MED ORDER — METOPROLOL TARTRATE 25 MG PO TABS
25.0000 mg | ORAL_TABLET | Freq: Two times a day (BID) | ORAL | Status: DC
  Administered 2017-07-31 – 2017-08-02 (×5): 25 mg via ORAL
  Filled 2017-07-31 (×5): qty 1

## 2017-07-31 MED ORDER — ORAL CARE MOUTH RINSE
15.0000 mL | Freq: Two times a day (BID) | OROMUCOSAL | Status: DC
  Administered 2017-07-31 – 2017-08-06 (×8): 15 mL via OROMUCOSAL

## 2017-07-31 MED ORDER — ISOSORBIDE MONONITRATE ER 30 MG PO TB24
30.0000 mg | ORAL_TABLET | Freq: Every day | ORAL | Status: DC
  Administered 2017-07-31: 30 mg via ORAL
  Filled 2017-07-31 (×2): qty 1

## 2017-07-31 MED ORDER — METOPROLOL TARTRATE 25 MG/10 ML ORAL SUSPENSION: 25.0000 mg | Freq: Two times a day (BID) | ORAL | Status: DC

## 2017-07-31 MED ORDER — MAGNESIUM SULFATE 4 GM/100ML IV SOLN
4.0000 g | Freq: Once | INTRAVENOUS | Status: AC
  Administered 2017-07-31: 4 g via INTRAVENOUS
  Filled 2017-07-31: qty 100

## 2017-07-31 MED ORDER — INSULIN DETEMIR 100 UNIT/ML ~~LOC~~ SOLN
25.0000 [IU] | Freq: Two times a day (BID) | SUBCUTANEOUS | Status: DC
  Administered 2017-07-31 – 2017-08-01 (×3): 25 [IU] via SUBCUTANEOUS
  Filled 2017-07-31 (×4): qty 0.25

## 2017-07-31 MED ORDER — INSULIN ASPART 100 UNIT/ML ~~LOC~~ SOLN
0.0000 [IU] | SUBCUTANEOUS | Status: DC
  Administered 2017-07-31 (×2): 2 [IU] via SUBCUTANEOUS
  Administered 2017-07-31: 4 [IU] via SUBCUTANEOUS
  Administered 2017-08-01: 3 [IU] via SUBCUTANEOUS

## 2017-07-31 MED ORDER — GLYBURIDE 5 MG PO TABS
5.0000 mg | ORAL_TABLET | Freq: Every day | ORAL | Status: DC
  Administered 2017-08-01 – 2017-08-06 (×6): 5 mg via ORAL
  Filled 2017-07-31 (×6): qty 1

## 2017-07-31 MED ORDER — ENOXAPARIN SODIUM 40 MG/0.4ML ~~LOC~~ SOLN
40.0000 mg | Freq: Every day | SUBCUTANEOUS | Status: DC
  Administered 2017-07-31 – 2017-08-05 (×6): 40 mg via SUBCUTANEOUS
  Filled 2017-07-31 (×5): qty 0.4

## 2017-07-31 MED ORDER — DEXTROSE 50 % IV SOLN
INTRAVENOUS | Status: AC
  Administered 2017-07-31: 14 mL
  Filled 2017-07-31: qty 50

## 2017-07-31 NOTE — Progress Notes (Addendum)
1 Day Post-Op Procedure(s) (LRB): CORONARY ARTERY BYPASS GRAFTING (CABG)  x four, using right internal mammary artery, right greater saphenous vein, left radial artery (N/A) RADIAL ARTERY HARVEST (Left) INTRAOPERATIVE TRANSESOPHAGEAL ECHOCARDIOGRAM (N/A) Subjective: Feels well, denies nausea, pain well controlled  Objective: Vital signs in last 24 hours: Temp:  [96.3 F (35.7 C)-101.5 F (38.6 C)] 99.3 F (37.4 C) (10/13 0845) Pulse Rate:  [79-125] 108 (10/13 0845) Cardiac Rhythm: Normal sinus rhythm (10/13 0400) Resp:  [0-34] 21 (10/13 0630) BP: (75-125)/(57-83) 94/70 (10/13 0800) SpO2:  [90 %-100 %] 91 % (10/13 0845) Arterial Line BP: (84-233)/(53-227) 124/61 (10/13 0845) FiO2 (%):  [40 %-50 %] 40 % (10/12 2045) Weight:  [185 lb 13.6 oz (84.3 kg)-191 lb 9.3 oz (86.9 kg)] 191 lb 9.3 oz (86.9 kg) (10/13 0630)  Hemodynamic parameters for last 24 hours: PAP: (16-52)/(4-29) 23/11 CO:  [2.6 L/min-6.4 L/min] 6.2 L/min CI:  [1.4 L/min/m2-3.5 L/min/m2] 3.4 L/min/m2  Intake/Output from previous day: 10/12 0701 - 10/13 0700 In: 6149.9 [P.O.:175; I.V.:3764.9; Blood:310; IV Piggyback:1900] Out: 4249 [Urine:3050; Blood:400; Chest Tube:799] Intake/Output this shift: Total I/O In: 56.2 [I.V.:56.2] Out: 50 [Urine:50]  General appearance: alert, cooperative and no distress Neurologic: intact Heart: tachy, regular Lungs: diminished breath sounds bibasilar Abdomen: normal findings: soft, non-tender  Lab Results:  Recent Labs  07/30/17 1959 07/30/17 2009 07/31/17 0350  WBC 9.8  --  10.1  HGB 8.0* 8.8* 7.8*  HCT 24.5* 26.0* 24.1*  PLT 160  --  158   BMET:  Recent Labs  07/30/17 0052  07/30/17 2009 07/31/17 0350  NA 137  < > 142 139  K 3.6  < > 4.0 3.3*  CL 104  < > 106 107  CO2 25  --   --  23  GLUCOSE 136*  < > 115* 139*  BUN 12  < > 5* <5*  CREATININE 0.72  < > 0.50 0.56  CALCIUM 10.0  --   --  9.4  < > = values in this interval not displayed.  PT/INR:  Recent  Labs  07/30/17 1408  LABPROT 19.1*  INR 1.62   ABG    Component Value Date/Time   PHART 7.458 (H) 07/30/2017 2205   HCO3 26.9 07/30/2017 2205   TCO2 28 07/30/2017 2205   ACIDBASEDEF 1.0 07/30/2017 1541   O2SAT 90.0 07/30/2017 2205   CBG (last 3)   Recent Labs  07/31/17 0517 07/31/17 0559 07/31/17 0701  GLUCAP 152* 141* 114*    Assessment/Plan: S/P Procedure(s) (LRB): CORONARY ARTERY BYPASS GRAFTING (CABG)  x four, using right internal mammary artery, right greater saphenous vein, left radial artery (N/A) RADIAL ARTERY HARVEST (Left) INTRAOPERATIVE TRANSESOPHAGEAL ECHOCARDIOGRAM (N/A) -  CV- good cardiac output- dc swan  Brief run of non sustained VT- increase BB, fix electrolytes, dc swan  imdur for radial graft  ASA, beta blocker, statin  Restart Cozaar in a day or two  RESP- IS for basilar atelectasis  RENAL- creatinine OK  Hypokalemia- supplement  Hypomagnesemia- supplement  ENDO_ CBG controlled with drip  Transition to levemir + SSI, restart PO meds  Anemia secondary to ABL- follow   LOS: 3 days    Loreli Slot 07/31/2017

## 2017-07-31 NOTE — Progress Notes (Signed)
      [address]       [place] [address]             [phone]      Up in chair, no pain at present  BP 103/69   Pulse (!) 101   Temp 97.9 F (36.6 C) (Oral)   Resp (!) 21   Ht 5' (1.524 m)   Wt 191 lb 9.3 oz (86.9 kg)   SpO2 95%   BMI 37.42 kg/m  On 50% VM  Intake/Output Summary (Last 24 hours) at 07/31/17 1744 Last data filed at 07/31/17 1600  Gross per 24 hour  Intake          1553.55 ml  Output             3054 ml  Net         -1500.45 ml   Needs to work harder with IS Will give another 20 mg of lasix this evening  Wean dopamine if BP allows  Viviann Spare C. Dorris Fetch, MD [facility] [phone]

## 2017-08-01 ENCOUNTER — Inpatient Hospital Stay (HOSPITAL_COMMUNITY): Payer: 59

## 2017-08-01 LAB — GLUCOSE, CAPILLARY
GLUCOSE-CAPILLARY: 159 mg/dL — AB (ref 65–99)
GLUCOSE-CAPILLARY: 97 mg/dL (ref 65–99)
Glucose-Capillary: 116 mg/dL — ABNORMAL HIGH (ref 65–99)
Glucose-Capillary: 150 mg/dL — ABNORMAL HIGH (ref 65–99)
Glucose-Capillary: 68 mg/dL (ref 65–99)

## 2017-08-01 LAB — CBC
HEMATOCRIT: 22.1 % — AB (ref 36.0–46.0)
Hemoglobin: 7.2 g/dL — ABNORMAL LOW (ref 12.0–15.0)
MCH: 25.7 pg — AB (ref 26.0–34.0)
MCHC: 32.6 g/dL (ref 30.0–36.0)
MCV: 78.9 fL (ref 78.0–100.0)
PLATELETS: 174 10*3/uL (ref 150–400)
RBC: 2.8 MIL/uL — ABNORMAL LOW (ref 3.87–5.11)
RDW: 14.7 % (ref 11.5–15.5)
WBC: 12.6 10*3/uL — AB (ref 4.0–10.5)

## 2017-08-01 LAB — PREPARE RBC (CROSSMATCH)

## 2017-08-01 LAB — BASIC METABOLIC PANEL
Anion gap: 6 (ref 5–15)
BUN: 9 mg/dL (ref 6–20)
CHLORIDE: 102 mmol/L (ref 101–111)
CO2: 26 mmol/L (ref 22–32)
CREATININE: 0.67 mg/dL (ref 0.44–1.00)
Calcium: 9.2 mg/dL (ref 8.9–10.3)
GFR calc Af Amer: >60 mL/min (ref >60)
GFR calc non Af Amer: >60 mL/min (ref >60)
GLUCOSE: 143 mg/dL — AB (ref 65–99)
POTASSIUM: 4.2 mmol/L (ref 3.5–5.1)
Sodium: 134 mmol/L — ABNORMAL LOW (ref 135–145)

## 2017-08-01 MED ORDER — SODIUM CHLORIDE 0.9 % IV SOLN: Freq: Once | INTRAVENOUS | Status: DC

## 2017-08-01 MED ORDER — INSULIN ASPART 100 UNIT/ML ~~LOC~~ SOLN: 0.0000 [IU] | Freq: Every day | SUBCUTANEOUS | Status: DC

## 2017-08-01 MED ORDER — INSULIN DETEMIR 100 UNIT/ML ~~LOC~~ SOLN
15.0000 [IU] | Freq: Two times a day (BID) | SUBCUTANEOUS | Status: DC
  Administered 2017-08-01 – 2017-08-02 (×2): 15 [IU] via SUBCUTANEOUS
  Filled 2017-08-01 (×3): qty 0.15

## 2017-08-01 MED ORDER — FUROSEMIDE 10 MG/ML IJ SOLN
40.0000 mg | Freq: Once | INTRAMUSCULAR | Status: AC
  Administered 2017-08-01: 40 mg via INTRAVENOUS
  Filled 2017-08-01: qty 4

## 2017-08-01 MED ORDER — INSULIN ASPART 100 UNIT/ML ~~LOC~~ SOLN
0.0000 [IU] | Freq: Three times a day (TID) | SUBCUTANEOUS | Status: DC
  Administered 2017-08-01: 2 [IU] via SUBCUTANEOUS
  Administered 2017-08-03: 3 [IU] via SUBCUTANEOUS
  Administered 2017-08-04: 2 [IU] via SUBCUTANEOUS
  Administered 2017-08-04 – 2017-08-05 (×2): 3 [IU] via SUBCUTANEOUS
  Administered 2017-08-05: 2 [IU] via SUBCUTANEOUS

## 2017-08-01 NOTE — Progress Notes (Signed)
Pt anxious.  100%  NRB in place.  o2 sat 93%.  Pt not ambulated due to tachypnea.

## 2017-08-01 NOTE — Progress Notes (Signed)
2 Days Post-Op Procedure(s) (LRB): CORONARY ARTERY BYPASS GRAFTING (CABG)  x four, using right internal mammary artery, right greater saphenous vein, left radial artery (N/A) RADIAL ARTERY HARVEST (Left) INTRAOPERATIVE TRANSESOPHAGEAL ECHOCARDIOGRAM (N/A) Subjective: Felt dizzy when she got OOB this AM Pain well controlled  Objective: Vital signs in last 24 hours: Temp:  [97.7 F (36.5 C)-99.8 F (37.7 C)] 99.8 F (37.7 C) (10/14 0800) Pulse Rate:  [93-108] 101 (10/14 0600) Cardiac Rhythm: Normal sinus rhythm (10/14 0350) BP: (86-119)/(61-89) 105/65 (10/14 0600) SpO2:  [80 %-99 %] 99 % (10/14 0600) Arterial Line BP: (85-132)/(57-128) 132/128 (10/13 1030) FiO2 (%):  [50 %-55 %] 50 % (10/13 1900) Weight:  [194 lb 6.4 oz (88.2 kg)] 194 lb 6.4 oz (88.2 kg) (10/14 0600)  Hemodynamic parameters for last 24 hours: PAP: (23-30)/(10-14) 25/13  Intake/Output from previous day: 10/13 0701 - 10/14 0700 In: 602.4 [I.V.:602.4] Out: 1155 [Urine:1105; Chest Tube:50] Intake/Output this shift: No intake/output data recorded.  General appearance: alert, cooperative and no distress Neurologic: intact Heart: tachy, regular Lungs: diminished breath sounds bibasilar Abdomen: normal findings: soft, non-tender  Lab Results:  Recent Labs  07/31/17 1633 07/31/17 1704 08/01/17 0532  WBC 12.1*  --  12.6*  HGB 7.4* 8.2* 7.2*  HCT 23.0* 24.0* 22.1*  PLT 166  --  174   BMET:  Recent Labs  07/31/17 0350  07/31/17 1704 08/01/17 0532  NA 139  --  137 134*  K 3.3*  --  4.6 4.2  CL 107  --  100* 102  CO2 23  --   --  26  GLUCOSE 139*  --  153* 143*  BUN <5*  --  7 9  CREATININE 0.56  < > 0.60 0.67  CALCIUM 9.4  --   --  9.2  < > = values in this interval not displayed.  PT/INR:  Recent Labs  07/30/17 1408  LABPROT 19.1*  INR 1.62   ABG    Component Value Date/Time   PHART 7.458 (H) 07/30/2017 2205   HCO3 26.9 07/30/2017 2205   TCO2 24 07/31/2017 1704   ACIDBASEDEF 1.0  07/30/2017 1541   O2SAT 90.0 07/30/2017 2205   CBG (last 3)   Recent Labs  07/31/17 2333 08/01/17 0401 08/01/17 0759  GLUCAP 138* 116* 159*    Assessment/Plan: S/P Procedure(s) (LRB): CORONARY ARTERY BYPASS GRAFTING (CABG)  x four, using right internal mammary artery, right greater saphenous vein, left radial artery (N/A) RADIAL ARTERY HARVEST (Left) INTRAOPERATIVE TRANSESOPHAGEAL ECHOCARDIOGRAM (N/A) -POD # 2  CV- mildly tachycardic, still on 0.5 mcg/kg/min of dopamine  Transfuse PRBC, dc dopamine  RESP_ bibasilar atelectasis worse on CXR  Continue IS, add flutter  RENAL- creatinine normal, volume overloaded  Diurese after transfusion  ENDO_ CBG reasonably well controlled on current regimen  Change to AC/HS  Anemia- symptomatic- will transfuse 1 unit PRBC  Continue cardiac rehab   LOS: 4 days    Loreli Slot 08/01/2017

## 2017-08-01 NOTE — Progress Notes (Signed)
      [address]       [name] [address]             [phone]      Feels better this afternoon  Was able to walk around the unit  BP 108/75 (BP Location: Left Arm)   Pulse 99   Temp 98.6 F (37 C) (Oral)   Resp 18   Ht 5' (1.524 m)   Wt 194 lb 6.4 oz (88.2 kg)   SpO2 100%   BMI 37.97 kg/m    Intake/Output Summary (Last 24 hours) at 08/01/17 1805 Last data filed at 08/01/17 1700  Gross per 24 hour  Intake           933.41 ml  Output              845 ml  Net            88.41 ml   Off dopamine  Will give another dose of lasix this evening  CBG low- decrease levemir  Viviann Spare C. Dorris Fetch, MD [facility] [phone]

## 2017-08-02 ENCOUNTER — Encounter (HOSPITAL_COMMUNITY): Payer: Self-pay | Admitting: Thoracic Surgery (Cardiothoracic Vascular Surgery)

## 2017-08-02 ENCOUNTER — Inpatient Hospital Stay (HOSPITAL_COMMUNITY): Payer: 59

## 2017-08-02 LAB — TYPE AND SCREEN
ABO/RH(D): A POS
Antibody Screen: NEGATIVE
UNIT DIVISION: 0
Unit division: 0

## 2017-08-02 LAB — CBC
HEMATOCRIT: 25.5 % — AB (ref 36.0–46.0)
HEMOGLOBIN: 8.3 g/dL — AB (ref 12.0–15.0)
MCH: 26 pg (ref 26.0–34.0)
MCHC: 32.5 g/dL (ref 30.0–36.0)
MCV: 79.9 fL (ref 78.0–100.0)
Platelets: 208 10*3/uL (ref 150–400)
RBC: 3.19 MIL/uL — AB (ref 3.87–5.11)
RDW: 14.8 % (ref 11.5–15.5)
WBC: 14.1 10*3/uL — AB (ref 4.0–10.5)

## 2017-08-02 LAB — BASIC METABOLIC PANEL
ANION GAP: 9 (ref 5–15)
BUN: 16 mg/dL (ref 6–20)
CO2: 26 mmol/L (ref 22–32)
Calcium: 9.1 mg/dL (ref 8.9–10.3)
Chloride: 99 mmol/L — ABNORMAL LOW (ref 101–111)
Creatinine, Ser: 0.78 mg/dL (ref 0.44–1.00)
GFR calc Af Amer: >60 mL/min (ref >60)
GFR calc non Af Amer: >60 mL/min (ref >60)
GLUCOSE: 105 mg/dL — AB (ref 65–99)
POTASSIUM: 3.9 mmol/L (ref 3.5–5.1)
Sodium: 134 mmol/L — ABNORMAL LOW (ref 135–145)

## 2017-08-02 LAB — BPAM RBC
BLOOD PRODUCT EXPIRATION DATE: 201811062359
Blood Product Expiration Date: 201811072359
ISSUE DATE / TIME: 201810141051
ISSUE DATE / TIME: 201810120954
Unit Type and Rh: 6200
Unit Type and Rh: 6200

## 2017-08-02 LAB — GLUCOSE, CAPILLARY
GLUCOSE-CAPILLARY: 114 mg/dL — AB (ref 65–99)
GLUCOSE-CAPILLARY: 99 mg/dL (ref 65–99)
Glucose-Capillary: 134 mg/dL — ABNORMAL HIGH (ref 65–99)
Glucose-Capillary: 140 mg/dL — ABNORMAL HIGH (ref 65–99)
Glucose-Capillary: 183 mg/dL — ABNORMAL HIGH (ref 65–99)
Glucose-Capillary: 59 mg/dL — ABNORMAL LOW (ref 65–99)
Glucose-Capillary: 79 mg/dL (ref 65–99)
Glucose-Capillary: 96 mg/dL (ref 65–99)
Glucose-Capillary: 96 mg/dL (ref 65–99)

## 2017-08-02 MED ORDER — MOVING RIGHT ALONG BOOK
Freq: Once | Status: DC
  Filled 2017-08-02: qty 1

## 2017-08-02 MED ORDER — MAGNESIUM HYDROXIDE 400 MG/5ML PO SUSP: 30.0000 mL | Freq: Every day | ORAL | Status: DC | PRN

## 2017-08-02 MED ORDER — POTASSIUM CHLORIDE CRYS ER 20 MEQ PO TBCR
20.0000 meq | EXTENDED_RELEASE_TABLET | Freq: Two times a day (BID) | ORAL | Status: AC
  Administered 2017-08-02 – 2017-08-04 (×6): 20 meq via ORAL
  Filled 2017-08-02 (×6): qty 1

## 2017-08-02 MED ORDER — SODIUM CHLORIDE 0.9% FLUSH
3.0000 mL | Freq: Two times a day (BID) | INTRAVENOUS | Status: DC
  Administered 2017-08-02 – 2017-08-05 (×6): 3 mL via INTRAVENOUS

## 2017-08-02 MED ORDER — ISOSORBIDE MONONITRATE ER 30 MG PO TB24
15.0000 mg | ORAL_TABLET | Freq: Every day | ORAL | Status: DC
  Administered 2017-08-02 – 2017-08-06 (×5): 15 mg via ORAL
  Filled 2017-08-02 (×5): qty 1

## 2017-08-02 MED ORDER — SODIUM CHLORIDE 0.9% FLUSH: 3.0000 mL | INTRAVENOUS | Status: DC | PRN

## 2017-08-02 MED ORDER — SODIUM CHLORIDE 0.9 % IV SOLN: 250.0000 mL | INTRAVENOUS | Status: DC | PRN

## 2017-08-02 MED ORDER — FUROSEMIDE 40 MG PO TABS
40.0000 mg | ORAL_TABLET | Freq: Every day | ORAL | Status: DC
  Administered 2017-08-02: 40 mg via ORAL
  Filled 2017-08-02: qty 1

## 2017-08-02 MED ORDER — ALUM & MAG HYDROXIDE-SIMETH 200-200-20 MG/5ML PO SUSP: 15.0000 mL | Freq: Four times a day (QID) | ORAL | Status: DC | PRN

## 2017-08-02 MED ORDER — WHITE PETROLATUM EX OINT
TOPICAL_OINTMENT | CUTANEOUS | Status: DC | PRN
  Administered 2017-08-05: 22:00:00 via TOPICAL
  Filled 2017-08-02 (×2): qty 28.35

## 2017-08-02 MED FILL — Electrolyte-R (PH 7.4) Solution: INTRAVENOUS | Qty: 4000 | Status: AC

## 2017-08-02 MED FILL — Mannitol IV Soln 20%: INTRAVENOUS | Qty: 500 | Status: AC

## 2017-08-02 MED FILL — Sodium Chloride IV Soln 0.9%: INTRAVENOUS | Qty: 2000 | Status: AC

## 2017-08-02 MED FILL — Albumin, Human Inj 5%: INTRAVENOUS | Qty: 250 | Status: AC

## 2017-08-02 MED FILL — Lidocaine HCl IV Inj 20 MG/ML: INTRAVENOUS | Qty: 5 | Status: AC

## 2017-08-02 MED FILL — Sodium Bicarbonate IV Soln 8.4%: INTRAVENOUS | Qty: 50 | Status: AC

## 2017-08-02 NOTE — Plan of Care (Addendum)
Problem: Limited Adherence to Nutrition-Related Recommendations (NB-1.6) Goal: Nutrition education Formal process to instruct or train a patient/client in a skill or to impart knowledge to help patients/clients voluntarily manage or modify food choices and eating behavior to maintain or improve health. Outcome: Completed/Met Date Met: 08/02/17  RD consulted for nutrition education regarding diabetes.   Lab Results  Component Value Date   HGBA1C 7.1 (H) 07/29/2017    RD provided "Carbohydrate Counting for People with Diabetes" handout from the Academy of [employer]etics. Discussed importance of controlled and consistent carbohydrate intake throughout the day. Also talked about desirable beverage options. Pt reveals her biggest challenge is "cake." Teach back method used.  Expect fair compliance.  Body mass index is 37.46 kg/m. Pt meets criteria for Obesity Class II based on current BMI.  Current diet order is Heart Healthy/Carbohydrate Modified. Labs and medications reviewed. No further nutrition interventions warranted at this time. If additional nutrition issues arise, please re-consult RD.  Arthur Holms, RD, LDN Pager #: [phone] After-Hours Pager #: [phone]

## 2017-08-02 NOTE — Plan of Care (Signed)
Problem: Health Behavior/Discharge Planning: Goal: Ability to manage health-related needs will improve Outcome: Progressing Diabetic education for nutrition  Problem: Physical Regulation: Goal: Ability to maintain clinical measurements within normal limits will improve Outcome: Progressing Weaning o2 to sao2> 90%  Problem: Activity: Goal: Risk for activity intolerance will decrease Outcome: Progressing Still tired after activity  Problem: Bowel/Gastric: Goal: Will not experience complications related to bowel motility Outcome: Progressing Stool softners given

## 2017-08-02 NOTE — Progress Notes (Signed)
3 Days Post-Op Procedure(s) (LRB): CORONARY ARTERY BYPASS GRAFTING (CABG)  x four, using right internal mammary artery, right greater saphenous vein, left radial artery (N/A) RADIAL ARTERY HARVEST (Left) INTRAOPERATIVE TRANSESOPHAGEAL ECHOCARDIOGRAM (N/A) Subjective: No complaints this AM Already walked around unit   Objective: Vital signs in last 24 hours: Temp:  [98.2 F (36.8 C)-99.8 F (37.7 C)] 98.2 F (36.8 C) (10/15 0030) Pulse Rate:  [96-115] 103 (10/15 0630) Cardiac Rhythm: Sinus tachycardia (10/15 0430) Resp:  [18-20] 18 (10/14 1330) BP: (98-126)/(69-88) 105/78 (10/15 0630) SpO2:  [86 %-100 %] 92 % (10/15 0630) Weight:  [191 lb 12.8 oz (87 kg)] 191 lb 12.8 oz (87 kg) (10/15 0500)  Hemodynamic parameters for last 24 hours:    Intake/Output from previous day: 10/14 0701 - 10/15 0700 In: 918.7 [P.O.:480; I.V.:123.7; Blood:315] Out: 1295 [Urine:1295] Intake/Output this shift: No intake/output data recorded.  General appearance: alert, cooperative and no distress Neurologic: intact Heart: regular rate and rhythm Lungs: diminished breath sounds bibasilar Abdomen: normal findings: soft, non-tender  Lab Results:  Recent Labs  08/01/17 0532 08/02/17 0407  WBC 12.6* 14.1*  HGB 7.2* 8.3*  HCT 22.1* 25.5*  PLT 174 208   BMET:  Recent Labs  08/01/17 0532 08/02/17 0407  NA 134* 134*  K 4.2 3.9  CL 102 99*  CO2 26 26  GLUCOSE 143* 105*  BUN 9 16  CREATININE 0.67 0.78  CALCIUM 9.2 9.1    PT/INR:  Recent Labs  07/30/17 1408  LABPROT 19.1*  INR 1.62   ABG    Component Value Date/Time   PHART 7.458 (H) 07/30/2017 2205   HCO3 26.9 07/30/2017 2205   TCO2 24 07/31/2017 1704   ACIDBASEDEF 1.0 07/30/2017 1541   O2SAT 90.0 07/30/2017 2205   CBG (last 3)   Recent Labs  08/01/17 2026 08/02/17 0004 08/02/17 0418  GLUCAP 140* 96 114*    Assessment/Plan: S/P Procedure(s) (LRB): CORONARY ARTERY BYPASS GRAFTING (CABG)  x four, using right internal  mammary artery, right greater saphenous vein, left radial artery (N/A) RADIAL ARTERY HARVEST (Left) INTRAOPERATIVE TRANSESOPHAGEAL ECHOCARDIOGRAM (N/A) Plan for transfer to step-down: see transfer orders  CV- stable in SR. BP still on low side, hold off on restarting Cozaar  RESP- continue IS and flutter  RENAL- creatinine and lytes OK  Still above preop weight- PO lasix  ENDO- CBG better controlled- dc levemir, continue metformin and glyburide  Anemia secondary to ABL- better after transfusion   LOS: 5 days    Loreli Slot 08/02/2017

## 2017-08-02 NOTE — Progress Notes (Signed)
Report called to 4E.

## 2017-08-03 ENCOUNTER — Inpatient Hospital Stay (HOSPITAL_COMMUNITY): Payer: 59

## 2017-08-03 LAB — CBC
HEMATOCRIT: 26.5 % — AB (ref 36.0–46.0)
HEMOGLOBIN: 8.6 g/dL — AB (ref 12.0–15.0)
MCH: 25.7 pg — AB (ref 26.0–34.0)
MCHC: 32.5 g/dL (ref 30.0–36.0)
MCV: 79.1 fL (ref 78.0–100.0)
Platelets: 300 10*3/uL (ref 150–400)
RBC: 3.35 MIL/uL — AB (ref 3.87–5.11)
RDW: 14.9 % (ref 11.5–15.5)
WBC: 14.8 10*3/uL — ABNORMAL HIGH (ref 4.0–10.5)

## 2017-08-03 LAB — BASIC METABOLIC PANEL
Anion gap: 9 (ref 5–15)
BUN: 14 mg/dL (ref 6–20)
CHLORIDE: 104 mmol/L (ref 101–111)
CO2: 23 mmol/L (ref 22–32)
CREATININE: 0.65 mg/dL (ref 0.44–1.00)
Calcium: 9.3 mg/dL (ref 8.9–10.3)
GFR calc Af Amer: >60 mL/min (ref >60)
GFR calc non Af Amer: >60 mL/min (ref >60)
GLUCOSE: 110 mg/dL — AB (ref 65–99)
POTASSIUM: 3.8 mmol/L (ref 3.5–5.1)
Sodium: 136 mmol/L (ref 135–145)

## 2017-08-03 LAB — GLUCOSE, CAPILLARY
GLUCOSE-CAPILLARY: 103 mg/dL — AB (ref 65–99)
GLUCOSE-CAPILLARY: 104 mg/dL — AB (ref 65–99)
Glucose-Capillary: 104 mg/dL — ABNORMAL HIGH (ref 65–99)
Glucose-Capillary: 99 mg/dL (ref 65–99)
Glucose-Capillary: 162 mg/dL — ABNORMAL HIGH (ref 65–99)

## 2017-08-03 MED ORDER — METOPROLOL TARTRATE 12.5 MG HALF TABLET
12.5000 mg | ORAL_TABLET | Freq: Two times a day (BID) | ORAL | Status: DC
  Administered 2017-08-03: 12.5 mg via ORAL
  Filled 2017-08-03: qty 1

## 2017-08-03 MED ORDER — FUROSEMIDE 10 MG/ML IJ SOLN
40.0000 mg | Freq: Once | INTRAMUSCULAR | Status: AC
  Administered 2017-08-03: 40 mg via INTRAVENOUS
  Filled 2017-08-03: qty 4

## 2017-08-03 MED ORDER — FUROSEMIDE 40 MG PO TABS
40.0000 mg | ORAL_TABLET | Freq: Every day | ORAL | Status: DC
  Filled 2017-08-03: qty 1

## 2017-08-03 MED ORDER — METOPROLOL TARTRATE 25 MG PO TABS: 25.0000 mg | ORAL_TABLET | Freq: Two times a day (BID) | ORAL | Status: DC

## 2017-08-03 MED ORDER — POTASSIUM CHLORIDE CRYS ER 20 MEQ PO TBCR
20.0000 meq | EXTENDED_RELEASE_TABLET | Freq: Once | ORAL | Status: AC
  Administered 2017-08-03: 20 meq via ORAL
  Filled 2017-08-03: qty 1

## 2017-08-03 MED ORDER — METOPROLOL TARTRATE 50 MG PO TABS
50.0000 mg | ORAL_TABLET | Freq: Two times a day (BID) | ORAL | Status: DC
  Administered 2017-08-03 – 2017-08-06 (×6): 50 mg via ORAL
  Filled 2017-08-03 (×6): qty 1

## 2017-08-03 MED ORDER — METFORMIN HCL 500 MG PO TABS
500.0000 mg | ORAL_TABLET | Freq: Two times a day (BID) | ORAL | Status: DC
  Administered 2017-08-03 – 2017-08-06 (×5): 500 mg via ORAL
  Filled 2017-08-03 (×5): qty 1

## 2017-08-03 MED FILL — Magnesium Sulfate Inj 50%: INTRAMUSCULAR | Qty: 10 | Status: AC

## 2017-08-03 MED FILL — Heparin Sodium (Porcine) Inj 1000 Unit/ML: INTRAMUSCULAR | Qty: 30 | Status: AC

## 2017-08-03 MED FILL — Potassium Chloride Inj 2 mEq/ML: INTRAVENOUS | Qty: 40 | Status: AC

## 2017-08-03 NOTE — Progress Notes (Signed)
Patient has been in S.T.110-120 with Freq. PVC and freq. Bigeminy PVC's

## 2017-08-03 NOTE — Progress Notes (Addendum)
      [address]       [place] [address]             [phone]        4 Days Post-Op Procedure(s) (LRB): CORONARY ARTERY BYPASS GRAFTING (CABG)  x four, using right internal mammary artery, right greater saphenous vein, left radial artery (N/A) RADIAL ARTERY HARVEST (Left) INTRAOPERATIVE TRANSESOPHAGEAL ECHOCARDIOGRAM (N/A)  Subjective: She had loose stools yesterday.  Objective: Vital signs in last 24 hours: Temp:  [98.5 F (36.9 C)-98.8 F (37.1 C)] 98.7 F (37.1 C) (10/16 0529) Pulse Rate:  [94-101] 101 (10/15 1500) Cardiac Rhythm: Sinus tachycardia (10/15 1900) Resp:  [16-24] 24 (10/16 0529) BP: (99-125)/(71-87) 122/73 (10/16 0529) SpO2:  [89 %-96 %] 95 % (10/16 0529) Weight:  [194 lb 1.6 oz (88 kg)] 194 lb 1.6 oz (88 kg) (10/16 0529)  Pre op weight 84.3 kg Current Weight  08/03/17 194 lb 1.6 oz (88 kg)      Intake/Output from previous day: 10/15 0701 - 10/16 0700 In: 240 [P.O.:240] Out: 900 [Urine:900]   Physical Exam:  Cardiovascular: Slightly tachycardic this am Pulmonary: Diminished at bases Abdomen: Soft, non tender, bowel sounds present. Extremities: Bilateral lower extremity edema. Wounds: Clean and dry.  No erythema or signs of infection.  Lab Results: CBC: Recent Labs  08/02/17 0407 08/03/17 0334  WBC 14.1* 14.8*  HGB 8.3* 8.6*  HCT 25.5* 26.5*  PLT 208 300   BMET:  Recent Labs  08/02/17 0407 08/03/17 0334  NA 134* 136  K 3.9 3.8  CL 99* 104  CO2 26 23  GLUCOSE 105* 110*  BUN 16 14  CREATININE 0.78 0.65  CALCIUM 9.1 9.3    PT/INR:  Lab Results  Component Value Date   INR 1.62 07/30/2017   INR 1.13 07/29/2017   INR 1.01 07/28/2017   ABG:  INR: Will add last result for INR, ABG once components are confirmed Will add last 4 CBG results once components are confirmed  Assessment/Plan:  1. CV - S/p NSTEMI. Has had frequent PVCs,bigeminy, and ST. On Imdur 15 mg daily. Will start low dose BB. 2.   Pulmonary - On 4 liters of oxygen via [place]. Will wean to room air as tolerates. CXR shows low lung volumes with atelectasis, no pneumothorax, and small bilateral pleural effusions. Encourage incentive spirometer. 3. Volume Overload - On Lasix 40 mg daily but will give IV this am. 4.  Acute blood loss anemia - H and H this am is 8.6 and 26.5-s/p transfusion. 5. DM-CBGs 96/104/99. On Glyburide 5 mg daily, Metformin 1000 mg bid, and Insulin. Will decrease Metformin to 500 mg bid as not taking a lot of po. 6. Supplement potassium 7. Stop stool softeners  ZIMMERMAN,DONIELLE MPA-C 08/03/2017,7:37 AM Patient seen and examined, agree with above Needs to continue with IS and flutter for atelectasis Still has sinus tachycardia- increase lopressor to 50 BID  Viviann Spare C. Dorris Fetch, MD [facility] [phone]

## 2017-08-03 NOTE — Discharge Summary (Signed)
Physician Discharge Summary  Patient ID: [name] [name] MRN: [id number] DOB/AGE: 1966-06-26 51 y.o.  Admit date: 07/28/2017 Discharge date: 08/06/2017  Admission Diagnoses:  Patient Active Problem List   Diagnosis Date Noted  . NSTEMI (non-ST elevated myocardial infarction) (HCC) 07/28/2017  . Hypercalcemia 10/05/2016  . Essential hypertension 10/05/2016   Discharge Diagnoses:   Patient Active Problem List   Diagnosis Date Noted  . 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 NSTEMI (non-ST elevated myocardial infarction) (HCC) 07/28/2017  . Hypercalcemia 10/05/2016  . Essential hypertension 10/05/2016  3 vessel coronary artery disease  Discharged Condition: good  History of Present Illness:  [name] [name] is a 51 yo AA female with known history of Type 2 DM, Hypertension, Hyperlipidemia, and obesity.  She was in her usual state of health until about 3 weeks ago when she developed chest pressure and tingling in her left arm.  The pain was exacerbated with exertion.  She presented on 07/28/2017 after experiencing an episode while walking up a flight of stairs.  Her family convinced her to be seen by her family doctor who sent her to the ED for evaluation and EKG was obtained and was concerning for MI.  Workup in the ED revealed and elevated Troponin level of 3.86.  She was ruled in for NSTEMI and started on Heparin drip.  She was admitted for further care.  Hospital Course:   She remained chest pain free during hospitalization.  She was taken to the cardiac catheterization lab on 07/29/2017.  She was found to have severe 3 vessel CAD with a preserved EF.  It was felt coronary bypass grafting would be indicated and TCTS consult was obtained.  The patient was evaluated by Dr. Dorris Fetch who was in agreement the patient would benefit from coronary bypass grafting.  The risks and benefits of the procedure were explained to the patient and he was agreeable to proceed.  She was taken to the operating room  and underwent CABG x 4 utilizing RIMA to PDA, SVG to Diagonal 2, and sequential radial artery graft to OM1 and OM2.  She also underwent open harvest of left radial artery and endoscopic harvest of greater saphenous vein from right thigh.  She tolerated the procedure without difficulty and was taken to the SICU in stable condition.  She was extubated the evening of surgery.  During her stay in the SICU the patient was weaned off Dopamine as BP allowed.  Her chest tubes and arterial lines were removed without difficulty.  She was diuresed with Lasix for hypervolemia.  She was started on Imdur for her radial artery graft.  She had post operative blood loss anemia and was transfused packed cells with improvement of symptoms.  She was restarted on her home diabetic medications as blood sugars allowed.  She was maintaining NSR and ambulating around the SICU independently.  She was felt medically stable for transfer to the telemetry unit in stable condition.  She continued to progress.  She was started on low dose beta blocker for frequent PVCs and bigeminy.  She was weaned off oxygen as tolerated.  Her pacing wires were removed without difficulty on 08/04/2017.  She continues to ambulate independently.  Her pain is fairly well controlled.  She is medically stable for discharge home today.             Significant Diagnostic Studies: angiography:    1st Diag lesion, 70 %stenosed.  2nd Diag lesion, 90 %stenosed.  Dist LAD lesion, 95 %stenosed.  1st Mrg lesion, 90 %stenosed.  Ost 2nd Mrg to 2nd Mrg lesion, 100 %stenosed.  Mid Cx to Dist Cx lesion, 80 %stenosed.  Mid RCA lesion, 75 %stenosed.  Dist RCA lesion, 95 %stenosed.  The left ventricular systolic function is normal.  LV end diastolic pressure is mildly elevated.  The left ventricular ejection fraction is 55-65% by visual estimate.   1. Severe 3 vessel obstructive CAD 2. Good LV function 3. Mildly elevated LVEDP  Treatments: surgery:    Median sternotomy, extracorporeal circulation, coronary artery bypass grafting x4 (right internal mammary artery to posterior descending, saphenous vein graft to second diagonal, sequential left radial artery graft to OM-1 and OM-2 (Y-graft off vein), and endoscopic vein harvest, right thigh.  Disposition: 01-Home or Self Care   Discharge Medications:  The patient has been discharged on:   1.Beta Blocker:  Yes [ x  ]                              No   [   ]                              If No, reason:  2.Ace Inhibitor/ARB: Yes [x   ]                                     No  [    ]                                     If No, reason:   3.Statin:   Yes [ x  ]                  No  [   ]                  If No, reason:  4.Ecasa:  Yes  [x   ]                  No   [   ]                  If No, reason:      Allergies as of 08/06/2017      Reactions   Penicillins Hives      Medication List    STOP taking these medications   amLODipine 10 MG tablet Commonly known as:  NORVASC     TAKE these medications   aspirin 325 MG EC tablet Take 1 tablet (325 mg total) by mouth daily.   glyBURIDE 5 MG tablet Commonly known as:  DIABETA Take 5 mg by mouth daily.   guaiFENesin 600 MG 12 hr tablet Commonly known as:  MUCINEX Take 1 tablet (600 mg total) by mouth 2 (two) times daily as needed.   isosorbide mononitrate 30 MG 24 hr tablet Commonly known as:  IMDUR Take 0.5 tablets (15 mg total) by mouth daily.   losartan 50 MG tablet Commonly known as:  COZAAR Take 50 mg by mouth daily.   metFORMIN 500 MG tablet Commonly known as:  GLUCOPHAGE Take 1,000 mg by mouth 2 (two) times daily.   metoprolol tartrate 50 MG tablet Commonly known as:  LOPRESSOR Take  1 tablet (50 mg total) by mouth 2 (two) times daily.   simvastatin 20 MG tablet Commonly known as:  ZOCOR Take 20 mg by mouth at bedtime.   SYSTANE OP Place 1-2 drops into both eyes daily as needed (dry eyes).    traMADol 50 MG tablet Commonly known as:  ULTRAM Take 1-2 tablets (50-100 mg total) by mouth every 4 (four) hours as needed for moderate pain.      Follow-up Information    Loreli Slot, MD Follow up on 08/31/2017.   Specialty:  Cardiothoracic Surgery Why:  Appointment is at 10:45, please get CXR at 10:15 at [facility] located at on first floor of our office building Contact information: [address] [place] [address] [phone]        Abelino Derrick, PA-C Follow up on 08/19/2017.   Specialties:  Cardiology, Radiology Why:  Appointment is at 10:00 Contact information: [address] [place] [address] [phone]           Signed: Burr Medico 08/06/2017, 7:54 AM

## 2017-08-03 NOTE — Progress Notes (Signed)
Pt ambulated 448ft with rolling walker and standby assist. Stopped to rest 3-4 times.

## 2017-08-03 NOTE — Progress Notes (Signed)
CARDIAC REHAB PHASE I   PRE:  Rate/Rhythm: 118 ST    BP: sitting 131/93    SaO2: 98 3 1/2L, 96 2L, 92 RA  MODE:  Ambulation: 470 ft   POST:  Rate/Rhythm: 137 ST at max    BP: sitting 132/86     SaO2: 88 RA after 100 ft, 91 2L rest of walk  Pt sweating in bed, HR 120 ST (preop resting HR 105 ST). Weaned O2 some. Pt moving fairly well, walked with RW. Steady. Dyspneic with distance. SAO2 slightly low on RA so walked on 2L. No major c/o. HR increased with distance. To recliner. Only 250 mL on IS. Encouraged more use, flutter valve and more walking. [id number]   Harriet Masson CES, ACSM 08/03/2017 10:34 AM

## 2017-08-03 NOTE — Discharge Instructions (Signed)
1. Please wash incisions with soap and water daily.... You may shower, but do not allow water to beat on your chest 2. No Driving for 4 weeks or while taking narcotic pain medications 3. Diet- resume heart healthy/carb modified diet 4. Activity- please be up and ambulate at least 3 times per day 5. Please call office if incisions start to drain pus or become red 6. Sternal precautions- no lifting, pushing, pulling with arms over 8lbs for 6 weeks   Coronary Artery Bypass Grafting, Care After This sheet gives you information about how to care for yourself after your procedure. Your health care provider may also give you more specific instructions. If you have problems or questions, contact your health care provider. What can I expect after the procedure? After the procedure, it is common to have:  Nausea and a lack of appetite.  Constipation.  Weakness and fatigue.  Depression or irritability.  Pain or discomfort in your incision areas.  Follow these instructions at home: Medicines  Take over-the-counter and prescription medicines only as told by your health care provider. Do not stop taking medicines or start any new medicines without approval from your health care provider.  If you were prescribed an antibiotic medicine, take it as told by your health care provider. Do not stop taking the antibiotic even if you start to feel better.  Do not drive or use heavy machinery while taking prescription pain medicine. Incision care  Follow instructions from your health care provider about how to take care of your incisions. Make sure you: ? Wash your hands with soap and water before you change your bandage (dressing). If soap and water are not available, use hand sanitizer. ? Change your dressing as told by your health care provider. ? Leave stitches (sutures), skin glue, or adhesive strips in place. These skin closures may need to stay in place for 2 weeks or longer. If adhesive strip  edges start to loosen and curl up, you may trim the loose edges. Do not remove adhesive strips completely unless your health care provider tells you to do that.  Keep incision areas clean, dry, and protected.  Check your incision areas every day for signs of infection. Check for: ? More redness, swelling, or pain. ? More fluid or blood. ? Warmth. ? Pus or a bad smell.  If incisions were made in your legs: ? Avoid crossing your legs. ? Avoid sitting for long periods of time. Change positions every 30 minutes. ? Raise (elevate) your legs when you are sitting. Bathing  Do not take baths, swim, or use a hot tub until your health care provider approves.  Only take sponge baths. Pat the incisions dry. Do not rub incisions with a washcloth or towel.  Ask your health care provider when you can shower. Eating and drinking  Eat foods that are high in fiber, such as raw fruits and vegetables, whole grains, beans, and nuts. Meats should be lean cut. Avoid canned, processed, and fried foods. This can help prevent constipation and is a recommended part of a heart-healthy diet.  Drink enough fluid to keep your urine clear or pale yellow.  Limit alcohol intake to no more than 1 drink a day for nonpregnant women and 2 drinks a day for men. One drink equals 12 oz of beer, 5 oz of wine, or 1 oz of hard liquor. Activity  Rest and limit your activity as told by your health care provider. You may be instructed to: ? Stop  any activity right away if you have chest pain, shortness of breath, irregular heartbeats, or dizziness. Get help right away if you have any of these symptoms. ? Move around frequently for short periods or take short walks as directed by your health care provider. Gradually increase your activities. You may need physical therapy or cardiac rehabilitation to help strengthen your muscles and build your endurance. ? Avoid lifting, pushing, or pulling anything that is heavier than 10 lb (4.5  kg) for at least 6 weeks or as told by your health care provider.  Do not drive until your health care provider approves.  Ask your health care provider when you may return to work.  Ask your health care provider when you may resume sexual activity. General instructions  Do not use any products that contain nicotine or tobacco, such as cigarettes and e-cigarettes. If you need help quitting, ask your health care provider.  Take 2-3 deep breaths every few hours during the day, while you recover. This helps expand your lungs and prevent complications like pneumonia after surgery.  If you were given a device called an incentive spirometer, use it several times a day to practice deep breathing. Support your chest with a pillow or your arms when you take deep breaths or cough.  Wear compression stockings as told by your health care provider. These stockings help to prevent blood clots and reduce swelling in your legs.  Weigh yourself every day. This helps identify if your body is holding (retaining) fluid that may make your heart and lungs work harder.  Keep all follow-up visits as told by your health care provider. This is important. Contact a health care provider if:  You have more redness, swelling, or pain around any incision.  You have more fluid or blood coming from any incision.  Any incision feels warm to the touch.  You have pus or a bad smell coming from any incision  You have a fever.  You have swelling in your ankles or legs.  You have pain in your legs.  You gain 2 lb (0.9 kg) or more a day.  You are nauseous or you vomit.  You have diarrhea. Get help right away if:  You have chest pain that spreads to your jaw or arms.  You are short of breath.  You have a fast or irregular heartbeat.  You notice a "clicking" in your breastbone (sternum) when you move.  You have numbness or weakness in your arms or legs.  You feel dizzy or light-headed. Summary  After  the procedure, it is common to have pain or discomfort in the incision areas.  Do not take baths, swim, or use a hot tub until your health care provider approves.  Gradually increase your activities. You may need physical therapy or cardiac rehabilitation to help strengthen your muscles and build your endurance.  Weigh yourself every day. This helps identify if your body is holding (retaining) fluid that may make your heart and lungs work harder. This information is not intended to replace advice given to you by your health care provider. Make sure you discuss any questions you have with your health care provider. Document Released: 04/24/2005 Document Revised: 08/24/2016 Document Reviewed: 08/24/2016 Elsevier Interactive Patient Education  2018 [employer]   Endoscopic Saphenous Vein Harvesting, Care After Refer to this sheet in the next few weeks. These instructions provide you with information about caring for yourself after your procedure. Your health care provider may also give you more  specific instructions. Your treatment has been planned according to current medical practices, but problems sometimes occur. Call your health care provider if you have any problems or questions after your procedure. What can I expect after the procedure? After the procedure, it is common to have:  Pain.  Bruising.  Swelling.  Numbness.  Follow these instructions at home: Medicine  Take over-the-counter and prescription medicines only as told by your health care provider.  Do not drive or operate heavy machinery while taking prescription pain medicine. Incision care   Follow instructions from your health care provider about how to take care of the cut made during surgery (incision). Make sure you: ? Wash your hands with soap and water before you change your bandage (dressing). If soap and water are not available, use hand sanitizer. ? Change your dressing as told by your health care  provider. ? Leave stitches (sutures), skin glue, or adhesive strips in place. These skin closures may need to be in place for 2 weeks or longer. If adhesive strip edges start to loosen and curl up, you may trim the loose edges. Do not remove adhesive strips completely unless your health care provider tells you to do that.  Check your incision area every day for signs of infection. Check for: ? More redness, swelling, or pain. ? More fluid or blood. ? Warmth. ? Pus or a bad smell. General instructions  Raise (elevate) your legs above the level of your heart while you are sitting or lying down.  Do any exercises your health care providers have given you. These may include deep breathing, coughing, and walking exercises.  Do not shower, take baths, swim, or use a hot tub unless told by your health care provider.  Wear your elastic stocking if told by your health care provider.  Keep all follow-up visits as told by your health care provider. This is important. Contact a health care provider if:  Medicine does not help your pain.  Your pain gets worse.  You have new leg bruises or your leg bruises get bigger.  You have a fever.  Your leg feels numb.  You have more redness, swelling, or pain around your incision.  You have more fluid or blood coming from your incision.  Your incision feels warm to the touch.  You have pus or a bad smell coming from your incision. Get help right away if:  Your pain is severe.  You develop pain, tenderness, warmth, redness, or swelling in any part of your leg.  You have chest pain.  You have trouble breathing. This information is not intended to replace advice given to you by your health care provider. Make sure you discuss any questions you have with your health care provider. Document Released: 06/17/2011 Document Revised: 03/12/2016 Document Reviewed: 08/19/2015 Elsevier Interactive Patient Education  2018 [employer].

## 2017-08-04 LAB — GLUCOSE, CAPILLARY
GLUCOSE-CAPILLARY: 162 mg/dL — AB (ref 65–99)
GLUCOSE-CAPILLARY: 69 mg/dL (ref 65–99)
Glucose-Capillary: 142 mg/dL — ABNORMAL HIGH (ref 65–99)

## 2017-08-04 MED ORDER — FUROSEMIDE 40 MG PO TABS: 40.0000 mg | ORAL_TABLET | Freq: Every day | ORAL | Status: DC

## 2017-08-04 MED ORDER — FUROSEMIDE 40 MG PO TABS
40.0000 mg | ORAL_TABLET | Freq: Every day | ORAL | Status: DC
  Administered 2017-08-04 – 2017-08-05 (×2): 40 mg via ORAL
  Filled 2017-08-04 (×2): qty 1

## 2017-08-04 MED ORDER — GUAIFENESIN ER 600 MG PO TB12
600.0000 mg | ORAL_TABLET | Freq: Two times a day (BID) | ORAL | Status: DC
  Administered 2017-08-04 – 2017-08-06 (×5): 600 mg via ORAL
  Filled 2017-08-04 (×5): qty 1

## 2017-08-04 MED ORDER — ZOLPIDEM TARTRATE 5 MG PO TABS
5.0000 mg | ORAL_TABLET | Freq: Every evening | ORAL | Status: DC | PRN
Start: 2017-08-04 — End: 2017-08-06
  Administered 2017-08-04: 5 mg via ORAL
  Filled 2017-08-04: qty 1

## 2017-08-04 MED ORDER — FUROSEMIDE 10 MG/ML IJ SOLN: 40.0000 mg | Freq: Once | INTRAMUSCULAR | Status: DC

## 2017-08-04 NOTE — Progress Notes (Addendum)
      [address]       [place] [id number]             [phone]        5 Days Post-Op Procedure(s) (LRB): CORONARY ARTERY BYPASS GRAFTING (CABG)  x four, using right internal mammary artery, right greater saphenous vein, left radial artery (N/A) RADIAL ARTERY HARVEST (Left) INTRAOPERATIVE TRANSESOPHAGEAL ECHOCARDIOGRAM (N/A)  Subjective: Patient had dizziness yesterday afternoon when she got up. She has incisional pain and wants "something" to help bring up sputum.  Objective: Vital signs in last 24 hours: Temp:  [97.9 F (36.6 C)-99.4 F (37.4 C)] 98.8 F (37.1 C) (10/17 0550) Pulse Rate:  [126] 126 (10/16 1147) Cardiac Rhythm: Sinus tachycardia (10/16 1940) Resp:  [26-30] 26 (10/17 0550) BP: (124-144)/(79-95) 124/95 (10/17 0550) SpO2:  [94 %-98 %] 94 % (10/17 0550) Weight:  [190 lb 8 oz (86.4 kg)] 190 lb 8 oz (86.4 kg) (10/17 0550)  Pre op weight 84.3 kg Current Weight  08/04/17 190 lb 8 oz (86.4 kg)      Intake/Output from previous day: 10/16 0701 - 10/17 0700 In: 600 [P.O.:600] Out: 1300 [Urine:1300]   Physical Exam:  Cardiovascular: Tachycardic this am Pulmonary: Slightly diminished at bases Abdomen: Soft, non tender, bowel sounds present. Extremities: Bilateral lower extremity edema. Wounds: Clean and dry.  No erythema or signs of infection.  Lab Results: CBC:  Recent Labs  08/02/17 0407 08/03/17 0334  WBC 14.1* 14.8*  HGB 8.3* 8.6*  HCT 25.5* 26.5*  PLT 208 300   BMET:   Recent Labs  08/02/17 0407 08/03/17 0334  NA 134* 136  K 3.9 3.8  CL 99* 104  CO2 26 23  GLUCOSE 105* 110*  BUN 16 14  CREATININE 0.78 0.65  CALCIUM 9.1 9.3    PT/INR:  Lab Results  Component Value Date   INR 1.62 07/30/2017   INR 1.13 07/29/2017   INR 1.01 07/28/2017   ABG:  INR: Will add last result for INR, ABG once components are confirmed Will add last 4 CBG results once components are confirmed  Assessment/Plan:  1. CV - S/p  NSTEMI. Has had frequent PVCs,bigeminy. Mostly ST last 24 hours. On Imdur 15 mg daily and Lopressor 50 mg bid. Will discuss ST etiology with Dr. Dorris FetchHendrickson-? related to anemia  2.  Pulmonary - On 2 liters of oxygen via [place]. Will wean to room air as tolerates. Mucinex bid for cough. Encourage incentive spirometer. 3. Volume Overload - On Lasix 40 mg daily.  4.  Acute blood loss anemia - H and H yesterday is 8.6 and 26.5-s/p transfusion. 5. DM-CBGs 103/104/162. On Glyburide 5 mg daily, Metformin 500 mg bid, and Insulin PRN.  6. Remove EPW 7. Possibly home 1-2 days  ZIMMERMAN,DONIELLE MPA-C 08/04/2017,7:51 AM  Patient seen and examined, agree with above Still has baseline sinus tachycardia- will see how she does today with increased lopressor dose Weight down from yesterday- continue diuresis Needs to do better with IS  Viviann SpareSteven C. Dorris FetchHendrickson, MD [facility] [phone]

## 2017-08-04 NOTE — Progress Notes (Signed)
Epicardial pacing wires discontinued as per orders. Pt tolerated well, VSS.  Bedrest until 0945.

## 2017-08-04 NOTE — Progress Notes (Signed)
CARDIAC REHAB PHASE I   PRE:  Rate/Rhythm: 115 ST  BP:  Supine: 143/107  Sitting:   Standing:    SaO2: 98%RA  MODE:  Ambulation: 450 ft   POST:  Rate/Rhythm: 132 ST  BP:  Supine:   Sitting: 152/107, 140/101  Standing:     SaO2: 92% RA hall and room [id number] Pt walked 450 ft with rolling walker on RA with steady gait. Just needed a little help getting OOB. Tolerated well even though BP and HR elevated. Requested to go back to bed as she did not sleep well last night. Encouraged her to use IS and flutter valve. RN in to give meds for HR and BP.   Luetta Nuttingharlene Arhaan Chesnut, RN BSN  08/04/2017 11:27 AM

## 2017-08-04 NOTE — Progress Notes (Signed)
Pt heart rate sustaining in 110-120s at rest.  Jacques Earthly. Zimmerman, PA notified, received verbal orders to increase scheduled metoprolol dose from 12.5mg  to 25mg  BID.

## 2017-08-04 NOTE — Progress Notes (Signed)
Pt walked 46470ft with rolling walker on RA, minimal assist.  Sp02 did not fall below 91% on RA.  Pt did not appear as dyspneic as she did earlier today and did not require any rest breaks during walk.

## 2017-08-05 LAB — GLUCOSE, CAPILLARY
GLUCOSE-CAPILLARY: 134 mg/dL — AB (ref 65–99)
GLUCOSE-CAPILLARY: 167 mg/dL — AB (ref 65–99)
GLUCOSE-CAPILLARY: 46 mg/dL — AB (ref 65–99)
GLUCOSE-CAPILLARY: 96 mg/dL (ref 65–99)

## 2017-08-05 MED ORDER — DEXTROSE 50 % IV SOLN
25.0000 mL | Freq: Once | INTRAVENOUS | Status: AC
  Administered 2017-08-05: 25 mL via INTRAVENOUS

## 2017-08-05 MED ORDER — WHITE PETROLATUM EX OINT
TOPICAL_OINTMENT | CUTANEOUS | Status: AC
  Filled 2017-08-05: qty 28.35

## 2017-08-05 MED ORDER — DEXTROSE 50 % IV SOLN
INTRAVENOUS | Status: AC
  Filled 2017-08-05: qty 50

## 2017-08-05 MED ORDER — LOSARTAN POTASSIUM 25 MG PO TABS
25.0000 mg | ORAL_TABLET | Freq: Every day | ORAL | Status: DC
  Administered 2017-08-05 – 2017-08-06 (×2): 25 mg via ORAL
  Filled 2017-08-05 (×2): qty 1

## 2017-08-05 NOTE — Progress Notes (Signed)
Patient ambulated with RN about 350 ft using front wheel walker,room air tolerated well.

## 2017-08-05 NOTE — Progress Notes (Signed)
CARDIAC REHAB PHASE I   PRE:  Rate/Rhythm: 113 ST    BP: sitting 130/94    SaO2: 92 RA  MODE:  Ambulation: 740 ft   POST:  Rate/Rhythm: 131 ST    BP: sitting 148/95     SaO2: 93 RA  Pt tried to get out of bed independently however got stuck. Discussed mechanics. She walked without RW or assist in hall, just stand by supervision. No c/o. HR up to 131 ST. To recliner. Pt can walk independently or with family/staff. Gave her diet sheets and CRPII brochure and videos to watch today. Encouraged more walking today. [phone]   Harriet MassonRandi Kristan Yi Falletta CES, ACSM 08/05/2017 8:39 AM

## 2017-08-05 NOTE — Progress Notes (Addendum)
Notified by NT that pt CBG was 46. Pt complains of being a little bit sleepy. Given 25mL of D50 IV. Given snack. Will recheck CBG in 15 minutes. Pt states she ate at least half of her lunch.   CBG increased to 60 on recheck. Pt given dinner tray. Will recheck CBG.  CBG up to 96. Pt resting comfortably with visitors. Educated to call RN if symptoms such as lightheadedness, dizziness, fatigue, diaphoresis begin. Spoke with Lowella DandyErin Barrett PA to inform her of the hypoglycemic episode as it occurred yesterday as well.  Leonidas Rombergaitlin S Bumbledare, RN

## 2017-08-05 NOTE — Progress Notes (Addendum)
      [address]       [place] [id number]             [phone]      6 Days Post-Op Procedure(s) (LRB): CORONARY ARTERY BYPASS GRAFTING (CABG)  x four, using right internal mammary artery, right greater saphenous vein, left radial artery (N/A) RADIAL ARTERY HARVEST (Left) INTRAOPERATIVE TRANSESOPHAGEAL ECHOCARDIOGRAM (N/A)   Subjective:  [name] states she is so/so this morning.  She states she is getting a little depressed and has a headache, especially with laying flat.  Objective: Vital signs in last 24 hours: Temp:  [97.3 F (36.3 C)-99.2 F (37.3 C)] 97.3 F (36.3 C) (10/18 0347) Pulse Rate:  [104-116] 104 (10/18 0347) Cardiac Rhythm: Sinus tachycardia (10/18 0700) Resp:  [27-33] 27 (10/18 0347) BP: (133-164)/(91-114) 164/114 (10/18 0347) SpO2:  [95 %-98 %] 95 % (10/18 0347) Weight:  [188 lb 14.4 oz (85.7 kg)] 188 lb 14.4 oz (85.7 kg) (10/18 0347)  Intake/Output from previous day: 10/17 0701 - 10/18 0700 In: 240 [P.O.:240] Out: 1000 [Urine:1000]  General appearance: alert, cooperative and no distress Heart: regular rate and rhythm and tachy Lungs: clear to auscultation bilaterally Abdomen: soft, non-tender; bowel sounds normal; no masses,  no organomegaly Extremities: edema trace Wound: clean and dry  Lab Results:  Recent Labs  08/03/17 0334  WBC 14.8*  HGB 8.6*  HCT 26.5*  PLT 300   BMET:  Recent Labs  08/03/17 0334  NA 136  K 3.8  CL 104  CO2 23  GLUCOSE 110*  BUN 14  CREATININE 0.65  CALCIUM 9.3    PT/INR: No results for input(s): LABPROT, INR in the last 72 hours. ABG    Component Value Date/Time   PHART 7.458 (H) 07/30/2017 2205   HCO3 26.9 07/30/2017 2205   TCO2 24 07/31/2017 1704   ACIDBASEDEF 1.0 07/30/2017 1541   O2SAT 90.0 07/30/2017 2205   CBG (last 3)   Recent Labs  08/04/17 1133 08/04/17 1650 08/05/17 0615  GLUCAP 142* 69 134*    Assessment/Plan: S/P Procedure(s) (LRB): CORONARY ARTERY  BYPASS GRAFTING (CABG)  x four, using right internal mammary artery, right greater saphenous vein, left radial artery (N/A) RADIAL ARTERY HARVEST (Left) INTRAOPERATIVE TRANSESOPHAGEAL ECHOCARDIOGRAM (N/A)  1. CV- Sinus Tachy, remains hypertensive- continue Lopressor, Imdur for radial graft, restart home cozaar at 25 mg for HTN 2. Pulm- no acute issues, denies shortness of breath, sats acceptable with ambulation, continue IS 3. Renal- weight is at baseline, no LE edema present.. Possibly stop Lasix today 4. DM- sugars controlled, continue current regimen 5. Dispo- patient stable, possibly stop Lasix today, her weight is stable and not edematous on exam, patient wants to stay 1 more day, will plan to d/c in AM if remains clinically stable   LOS: 8 days    [name], [name] 08/05/2017 Patient seen and examined, agree with above She looks better today Home in AM  MulhallSteven C. Dorris FetchHendrickson, MD [facility] [phone]

## 2017-08-06 LAB — TYPE AND SCREEN
ABO/RH(D): A POS
ANTIBODY SCREEN: NEGATIVE
Unit division: 0

## 2017-08-06 LAB — BPAM RBC
Blood Product Expiration Date: 201810242359
UNIT TYPE AND RH: 6200

## 2017-08-06 MED ORDER — ASPIRIN 325 MG PO TBEC: 325.0000 mg | DELAYED_RELEASE_TABLET | Freq: Every day | ORAL | 0 refills | Status: DC

## 2017-08-06 MED ORDER — GUAIFENESIN ER 600 MG PO TB12: 600.0000 mg | ORAL_TABLET | Freq: Two times a day (BID) | ORAL | Status: DC | PRN

## 2017-08-06 MED ORDER — METOPROLOL TARTRATE 50 MG PO TABS: 50.0000 mg | ORAL_TABLET | Freq: Two times a day (BID) | ORAL | 3 refills | Status: DC

## 2017-08-06 MED ORDER — ISOSORBIDE MONONITRATE ER 30 MG PO TB24: 15.0000 mg | ORAL_TABLET | Freq: Every day | ORAL | 0 refills | Status: DC

## 2017-08-06 MED ORDER — TRAMADOL HCL 50 MG PO TABS: 50.0000 mg | ORAL_TABLET | ORAL | 0 refills | Status: DC | PRN

## 2017-08-06 NOTE — Progress Notes (Signed)
CARDIAC REHAB PHASE I  Pt awaiting discharge, declines ambulation at this time. Cardiac surgery discharge education completed with pt at bedside. Reviewed risk factors, IS, sternal precautions, activity progression, exercise, heart healthy and diabetes diet handouts, daily weights and phase 2 cardiac rehab. Pt verbalized understanding, receptive to education, has viewed cardiac surgery discharge video.  Pt agrees to phase 2 cardiac rehab referral, [facility]. Pt in recliner, call bell within reach.   [phone] Joylene GrapesEmily C Jodi Kappes, RN, BSN 08/06/2017 10:11 AM

## 2017-08-06 NOTE — Progress Notes (Addendum)
Patient in a stable condition, discharge education reviewed with patient , she verbalized understanding, iv removed,sutures dc as ordered, tele dc ccmd notified, patient belongings at bedside, patient's family transported patient home.

## 2017-08-06 NOTE — Progress Notes (Addendum)
      [address]       [place] [id number]             [phone]      7 Days Post-Op Procedure(s) (LRB): CORONARY ARTERY BYPASS GRAFTING (CABG)  x four, using right internal mammary artery, right greater saphenous vein, left radial artery (N/A) RADIAL ARTERY HARVEST (Left) INTRAOPERATIVE TRANSESOPHAGEAL ECHOCARDIOGRAM (N/A)   Subjective:  [name] [name] has no new complaints.  She is ready to go home today.  + ambulation  + BM  Objective: Vital signs in last 24 hours: Temp:  [97.3 F (36.3 C)-100.3 F (37.9 C)] 99 F (37.2 C) (10/19 0448) Pulse Rate:  [99-115] 101 (10/19 0448) Cardiac Rhythm: Sinus tachycardia (10/19 0700) Resp:  [20-27] 27 (10/19 0448) BP: (127-148)/(91-99) 136/91 (10/19 0448) SpO2:  [96 %-98 %] 98 % (10/19 0448) Weight:  [190 lb 1.6 oz (86.2 kg)] 190 lb 1.6 oz (86.2 kg) (10/19 0448)  Intake/Output from previous day: 10/18 0701 - 10/19 0700 In: 243 [P.O.:240; I.V.:3] Out: -   General appearance: alert, cooperative and no distress Heart: regular rate and rhythm Lungs: clear to auscultation bilaterally Abdomen: soft, non-tender; bowel sounds normal; no masses,  no organomegaly Extremities: no edema present Wound: clean and dry  Lab Results: No results for input(s): WBC, HGB, HCT, PLT in the last 72 hours. BMET: No results for input(s): NA, K, CL, CO2, GLUCOSE, BUN, CREATININE, CALCIUM in the last 72 hours.  PT/INR: No results for input(s): LABPROT, INR in the last 72 hours. ABG    Component Value Date/Time   PHART 7.458 (H) 07/30/2017 2205   HCO3 26.9 07/30/2017 2205   TCO2 24 07/31/2017 1704   ACIDBASEDEF 1.0 07/30/2017 1541   O2SAT 90.0 07/30/2017 2205   CBG (last 3)   Recent Labs  08/05/17 1109 08/05/17 1613 08/05/17 1718  GLUCAP 167* 46* 96    Assessment/Plan: S/P Procedure(s) (LRB): CORONARY ARTERY BYPASS GRAFTING (CABG)  x four, using right internal mammary artery, right greater saphenous vein, left radial  artery (N/A) RADIAL ARTERY HARVEST (Left) INTRAOPERATIVE TRANSESOPHAGEAL ECHOCARDIOGRAM (N/A)  1. CV- Sinus Tach- continue Lopressor, remains mildy Hypertensive will increase Cozaar to home dose of 50 mg daily 2. Pulm- no acute issues, continue IS 3. Renal- creatinine has been stable, weight is stable 4. DM- hypoglycemic-- likely related to SSIP use, patients sugars have been controlled but she is getting sliding scale coverage with meals...  5. Dispo- patient stable, will d/c home today   LOS: 9 days    [name] [name] 08/06/2017 Patient seen and examined, agree with above Home today  Viviann SpareSteven C. Dorris FetchHendrickson, MD [facility] [phone]

## 2017-08-06 NOTE — Care Management Note (Signed)
Case Management Note Original Note Created Leone Havenaylor, Deborah Clinton, RN 07/30/2017, 3:45 PM  Patient Details  Name: [name] [name] MRN: [id number] Date of Birth: November 07, 1965  Subjective/Objective:  From home with spouse, post op CABG, husband works at night and home during the day, but mom states she will be able to assist patient at home because she is retired.  Spouse states patient has PCP and medication coverage.                     Action/Plan: NCM will follow for dc needs.   Expected Discharge Date:  08/06/17               Expected Discharge Plan:  Home/Self Care  In-House Referral:  NA  Discharge planning Services  CM Consult  Post Acute Care Choice:  NA Choice offered to:  NA  DME Arranged:    DME Agency:     HH Arranged:    HH Agency:     Status of Service:  Completed, signed off  If discussed at Long Length of Stay Meetings, dates discussed:    Discharge Disposition: home/self care   Additional Comments:  08/06/17- 1130- [name] RN, CM- pt for d/c home today, no CM needs noted for discharge.   [name], ForsythKristi Hall, RN 08/06/2017, 11:31 AM [phone]

## 2017-08-09 ENCOUNTER — Telehealth (HOSPITAL_COMMUNITY): Payer: Self-pay

## 2017-08-09 NOTE — Telephone Encounter (Signed)
Patient insurance is active and benefits verified. Patient insurance is [employer] - $20.00 co-payment, deductible $250/$222.78 has been met, out of pocket $2500/$642.78 has been met, no co-insurance and no pre-authorization. Passport/reference [id number].  Patient will be contacted and scheduled after their follow up appointment with the cardiologist on 08/19/17 and surgeon on 08/31/17, upon review by [facility] RN navigator.

## 2017-08-19 ENCOUNTER — Encounter: Payer: Self-pay | Admitting: Cardiology

## 2017-08-19 ENCOUNTER — Ambulatory Visit (INDEPENDENT_AMBULATORY_CARE_PROVIDER_SITE_OTHER): Payer: 59 | Admitting: Cardiology

## 2017-08-19 VITALS — BP 130/90 | HR 100 | Ht 60.0 in | Wt 183.0 lb

## 2017-08-19 DIAGNOSIS — E785 Hyperlipidemia, unspecified: Secondary | ICD-10-CM | POA: Insufficient documentation

## 2017-08-19 DIAGNOSIS — I10 Essential (primary) hypertension: Secondary | ICD-10-CM | POA: Diagnosis not present

## 2017-08-19 DIAGNOSIS — Z951 Presence of aortocoronary bypass graft: Secondary | ICD-10-CM

## 2017-08-19 DIAGNOSIS — E119 Type 2 diabetes mellitus without complications: Secondary | ICD-10-CM | POA: Insufficient documentation

## 2017-08-19 MED ORDER — ZOLPIDEM TARTRATE 5 MG PO TABS: 5.0000 mg | ORAL_TABLET | Freq: Every evening | ORAL | 0 refills | Status: DC | PRN

## 2017-08-19 MED ORDER — ATORVASTATIN CALCIUM 40 MG PO TABS: 40.0000 mg | ORAL_TABLET | Freq: Every day | ORAL | 3 refills | Status: DC

## 2017-08-19 NOTE — Assessment & Plan Note (Signed)
Presented with NSTEMI 07/28/17-cath showed severe CAD with normal LVF CABG x 4 with RIMA-PDA, LRA-OM1-OM2, SVG-Dx2

## 2017-08-19 NOTE — Progress Notes (Signed)
08/19/2017 [name] [name]   January 07, 1966  [id number]  Primary Physician Renford Dills, MD Primary Cardiologist: Dr Allyson Sabal  HPI:  51 y/o AA female with a history of NIDDM, HTN, and HLD who presented with a NSTEMI 07/28/17. Cath showed severe CAD with normal LVF. It was decided to proceed with CABG x4 which was done 07/30/17. She tolerated this well and was discharged 08/06/17. She is in the office today for follow up. Since discharge she has noted trouble sleeping -"can't fall asleep". She denies any orthopnea, PND, or tachycardia. Her blood sugar has been a little labile and I suggested she talk to her PCP about that.   Current Outpatient Prescriptions  Medication Sig Dispense Refill  . aspirin EC 325 MG EC tablet Take 1 tablet (325 mg total) by mouth daily. 30 tablet 0  . glyBURIDE (DIABETA) 5 MG tablet Take 5 mg by mouth daily.    Marland Kitchen guaiFENesin (MUCINEX) 600 MG 12 hr tablet Take 1 tablet (600 mg total) by mouth 2 (two) times daily as needed.    . isosorbide mononitrate (IMDUR) 30 MG 24 hr tablet Take 0.5 tablets (15 mg total) by mouth daily. 14 tablet 0  . losartan (COZAAR) 50 MG tablet Take 50 mg by mouth daily.    . metFORMIN (GLUCOPHAGE) 500 MG tablet Take 1,000 mg by mouth 2 (two) times daily.     . metoprolol tartrate (LOPRESSOR) 50 MG tablet Take 1 tablet (50 mg total) by mouth 2 (two) times daily. 60 tablet 3  . Polyethyl Glycol-Propyl Glycol (SYSTANE OP) Place 1-2 drops into both eyes daily as needed (dry eyes).     . traMADol (ULTRAM) 50 MG tablet Take 1-2 tablets (50-100 mg total) by mouth every 4 (four) hours as needed for moderate pain. 30 tablet 0  . atorvastatin (LIPITOR) 40 MG tablet Take 1 tablet (40 mg total) by mouth daily. 90 tablet 3  . zolpidem (AMBIEN) 5 MG tablet Take 1 tablet (5 mg total) by mouth at bedtime as needed for sleep. 15 tablet 0   No current facility-administered medications for this visit.     Allergies  Allergen Reactions  . Penicillins Hives      Past Medical History:  Diagnosis Date  . Diabetes mellitus without complication (HCC)   . Hyperlipidemia   . Hypertension   . Obesity     Social History   Social History  . Marital status: Single    Spouse name: N/A  . Number of children: N/A  . Years of education: N/A   Occupational History  . Not on file.   Social History Main Topics  . Smoking status: Never Smoker  . Smokeless tobacco: Never Used  . Alcohol use Yes  . Drug use: No  . Sexual activity: Not on file   Other Topics Concern  . Not on file   Social History Narrative  . No narrative on file     Family History  Problem Relation Age of Onset  . Hypertension Other      Review of Systems: General: negative for chills, fever, night sweats or weight changes.  Cardiovascular: negative for chest pain, dyspnea on exertion, edema, orthopnea, palpitations, paroxysmal nocturnal dyspnea or shortness of breath Dermatological: negative for rash Respiratory: negative for cough or wheezing Urologic: negative for hematuria Abdominal: negative for nausea, vomiting, diarrhea, bright red blood per rectum, melena, or hematemesis Neurologic: negative for visual changes, syncope, or dizziness All other systems reviewed and are otherwise negative except  as noted above.    Blood pressure 130/90, pulse 100, height 5' (1.524 m), weight 183 lb (83 kg).  General appearance: alert, cooperative, icteric and mildly obese Neck: no carotid bruit and no JVD Lungs: clear to auscultation bilaterally Heart: regular rate and rhythm Extremities: extremities normal, atraumatic, no cyanosis or edema Skin: Skin color, texture, turgor normal. No rashes or lesions Neurologic: Grossly normal  EKG NSR, poor anterior RW, LAD  ASSESSMENT AND PLAN:   S/P CABG x 4 Presented with NSTEMI 07/28/17-cath showed severe CAD with normal LVF CABG x 4 with RIMA-PDA, LRA-OM1-OM2, SVG-Dx2  Diabetes mellitus type II, non insulin dependent  (HCC) On oral agents, followed by PCP  Dyslipidemia LDL was 167  Essential hypertension Controlled   PLAN  I suggested we be more aggressive with her lipid therapy now that she has documented CAD. I changed her Zocor 20 mg to Lipitor 40 mg. She'll need a CMET and lipid profile in 2-3 months. We should discuss sleep apnea again at that visit, she does snore but denies daytime fatigue. F/U with Dr Allyson SabalBerry in 3 months. OK to start Rehab from our standpoint. Full dose ASA and Imdur per Dr Dorris FetchHendrickson (RA graft).   Corine ShelterLuke Aline Wesche PA-C 08/19/2017 10:26 AM

## 2017-08-19 NOTE — Assessment & Plan Note (Signed)
LDL was 167

## 2017-08-19 NOTE — Assessment & Plan Note (Signed)
Controlled.  

## 2017-08-19 NOTE — Assessment & Plan Note (Signed)
On oral agents, followed by PCP 

## 2017-08-19 NOTE — Patient Instructions (Signed)
Medication Instructions:  STOP- Simvastatin START- Atorvastatin 40 mg daily   If you need a refill on your cardiac medications before your next appointment, please call your pharmacy.  Labwork: CMP and Fasting Lipids in 2 months HERE IN OUR OFFICE AT [facility]  Testing/Procedures: None Ordered   Follow-Up: Your physician wants you to follow-up in: 3 Months with Dr Allyson SabalBerry.    Thank you for choosing [facility]!!

## 2017-08-23 ENCOUNTER — Telehealth (HOSPITAL_COMMUNITY): Payer: Self-pay

## 2017-08-23 NOTE — Telephone Encounter (Signed)
Called and spoke with patient in regards to Cardiac Rehab - Patient is interested in program. Scheduled orientation on 09/02/17 at 8:30am. Patient will attend the 9:45am exc class.

## 2017-08-30 ENCOUNTER — Other Ambulatory Visit: Payer: Self-pay | Admitting: Thoracic Surgery (Cardiothoracic Vascular Surgery)

## 2017-08-30 DIAGNOSIS — Z951 Presence of aortocoronary bypass graft: Secondary | ICD-10-CM

## 2017-08-31 ENCOUNTER — Ambulatory Visit (INDEPENDENT_AMBULATORY_CARE_PROVIDER_SITE_OTHER): Payer: Self-pay | Admitting: Thoracic Surgery (Cardiothoracic Vascular Surgery)

## 2017-08-31 ENCOUNTER — Encounter: Payer: Self-pay | Admitting: Thoracic Surgery (Cardiothoracic Vascular Surgery)

## 2017-08-31 ENCOUNTER — Other Ambulatory Visit: Payer: Self-pay

## 2017-08-31 ENCOUNTER — Ambulatory Visit
Admission: RE | Admit: 2017-08-31 | Discharge: 2017-08-31 | Disposition: A | Discharge status: Home / Self Care | Payer: 59 | Source: Ambulatory Visit | Attending: Thoracic Surgery (Cardiothoracic Vascular Surgery) | Admitting: Thoracic Surgery (Cardiothoracic Vascular Surgery)

## 2017-08-31 ENCOUNTER — Telehealth (HOSPITAL_COMMUNITY): Payer: Self-pay | Admitting: Pharmacy Technician

## 2017-08-31 VITALS — BP 138/98 | HR 100 | Ht 60.0 in | Wt 183.0 lb

## 2017-08-31 DIAGNOSIS — Z951 Presence of aortocoronary bypass graft: Secondary | ICD-10-CM

## 2017-08-31 NOTE — Patient Instructions (Signed)
Stop isosorbide (Imdur) after current prescription runs out

## 2017-08-31 NOTE — Progress Notes (Signed)
[address]       [place] [id number]             [phone]    HPI: [name] returns for a scheduled postoperative follow-up visit  [name] [name] is a 51 year old woman with a past history of hypertension, hyperlipidemia, type 2 diabetes, and obesity.  She presented with non-ST elevation MI.  She underwent coronary bypass grafting x4 using a right mammary and left radial on 07/30/2017.  Her postoperative course was unremarkable and she went home 7.  Overall she feels well.  She has not had any recurrent anginal pain.  She does have some discomfort in the sternal area with lying down and turning side to side at night.  She has been taking a pain pill before she goes to sleep.  She has not been using them during the day.  Past Medical History:  Diagnosis Date  . Diabetes mellitus without complication (HCC)   . Hyperlipidemia   . Hypertension   . Obesity     Current Outpatient Medications  Medication Sig Dispense Refill  . aspirin EC 325 MG EC tablet Take 1 tablet (325 mg total) by mouth daily. 30 tablet 0  . atorvastatin (LIPITOR) 40 MG tablet Take 1 tablet (40 mg total) by mouth daily. 90 tablet 3  . glyBURIDE (DIABETA) 5 MG tablet Take 5 mg by mouth daily.    Marland Kitchen. guaiFENesin (MUCINEX) 600 MG 12 hr tablet Take 1 tablet (600 mg total) by mouth 2 (two) times daily as needed.    . isosorbide mononitrate (IMDUR) 30 MG 24 hr tablet Take 0.5 tablets (15 mg total) by mouth daily. 14 tablet 0  . losartan (COZAAR) 50 MG tablet Take 50 mg by mouth daily.    . metFORMIN (GLUCOPHAGE) 500 MG tablet Take 1,000 mg by mouth 2 (two) times daily.     . metoprolol tartrate (LOPRESSOR) 50 MG tablet Take 1 tablet (50 mg total) by mouth 2 (two) times daily. 60 tablet 3  . Polyethyl Glycol-Propyl Glycol (SYSTANE OP) Place 1-2 drops into both eyes daily as needed (dry eyes).     . traMADol (ULTRAM) 50 MG tablet Take 1-2 tablets (50-100 mg total) by mouth every 4 (four) hours as  needed for moderate pain. 30 tablet 0  . zolpidem (AMBIEN) 5 MG tablet Take 1 tablet (5 mg total) by mouth at bedtime as needed for sleep. 15 tablet 0   No current facility-administered medications for this visit.     Physical Exam BP (!) 138/98 (BP Location: Left Arm, Patient Position: Sitting, Cuff Size: Large)   Pulse 100   Ht 5' (1.524 m)   Wt 183 lb (83 kg)   SpO2 98%   BMI 35.5774 kg/m  51 year old woman in no acute distress Alert and oriented x3 with no focal deficits Lungs clear with equal breath sounds bilaterally Cardiac regular rate and rhythm normal S1 and S2 Sternum stable, incision clean dry and intact Left arm and leg incisions healing well No peripheral edema  Diagnostic Tests: CHEST - 2 VIEW  COMPARISON:  08/03/2017  FINDINGS: Improved aeration in the lung bases with some residual linear scarring or subsegmental atelectasis in the left mid lung. No overt edema.  Heart size upper limits normal.  No effusion.  No pneumothorax.  Changes of median sternotomy and CABG. Anterior vertebral endplate spurring at multiple levels in the lower thoracic spine.  IMPRESSION: 1. Improved pulmonary aeration.  No acute findings  post CABG.   Electronically Signed   By: Corlis Leak  Hassell M.D.   On: 08/31/2017 11:22 I personally reviewed the chest x-ray and concur with the findings noted above.  Impression: [name] is a 51 year old woman who presented with a non-ST elevation MI.  She underwent coronary bypass grafting x4 using a right mammary artery and left radial.  She did not have significant LAD disease.  She is now about a month out from surgery and is doing well at this point in time.  She is not requiring any pain medication during the day but is still using it to help her get comfortable at night.  Her exercise tolerance is improving.  She is on Imdur for her radial graft.  She has only a few more pills left of that.  She can stop that once her current  prescription runs out.  She may begin driving.  Appropriate precautions were discussed.  She should not lift anything over 10 pounds for another 2 weeks.  After that her activities are unrestricted but she should build into new activities gradually.  Hypertension-blood pressure elevated today.  She is taking her metoprolol and Cozaar on a regular basis.  She has a blood pressure cuff at home, but is not working.  I advised her to get a new blood pressure cuff and check herself on a regular basis.  She understands target systolic pressure is less than 130 and target diastolic less than 90.  Plan: Follow-up with Dr. Allyson SabalBerry and Dr. Nehemiah SettlePolite.  I will be happy to see her back at any time if I can be of any further assistance with her care.  Loreli SlotSteven C Jagger Beahm, MD [facility] [phone]

## 2017-08-31 NOTE — Telephone Encounter (Signed)
Cardiac Rehab Medication Review by a Pharmacist  Does the patient  feel that his/her medications are working for him/her?  yes  Has the patient been experiencing any side effects to the medications prescribed?  yes  Does the patient measure his/her own blood pressure or blood glucose at home?  yes   Does the patient have any problems obtaining medications due to transportation or finances?   no  Understanding of regimen: good Understanding of indications: good Potential of compliance: good    Pharmacist comments: Patient states that her Imdur may be discontinued by MD soon, and her BP have been at goal at home.  She states that her CBGs have been in the 60s mid-day and we discussed the potential for glyburide to cause hypoglycemia and she is planning on discussing with PCP.   Dion BodyOriet, Chapman Matteucci M 08/31/2017 5:23 PM

## 2017-09-02 ENCOUNTER — Encounter (HOSPITAL_COMMUNITY)
Admission: RE | Admit: 2017-09-02 | Discharge: 2017-09-02 | Disposition: A | Discharge status: Home / Self Care | Payer: 59 | Source: Ambulatory Visit | Attending: Cardiovascular Disease | Admitting: Cardiovascular Disease

## 2017-09-02 ENCOUNTER — Encounter (HOSPITAL_COMMUNITY): Payer: Self-pay

## 2017-09-02 ENCOUNTER — Other Ambulatory Visit: Payer: Self-pay | Admitting: Physician Assistant

## 2017-09-02 VITALS — BP 122/78 | HR 100 | Ht 60.0 in | Wt 182.8 lb

## 2017-09-02 DIAGNOSIS — E119 Type 2 diabetes mellitus without complications: Secondary | ICD-10-CM | POA: Insufficient documentation

## 2017-09-02 DIAGNOSIS — Z951 Presence of aortocoronary bypass graft: Secondary | ICD-10-CM | POA: Insufficient documentation

## 2017-09-02 DIAGNOSIS — Z7982 Long term (current) use of aspirin: Secondary | ICD-10-CM | POA: Insufficient documentation

## 2017-09-02 DIAGNOSIS — Z7984 Long term (current) use of oral hypoglycemic drugs: Secondary | ICD-10-CM | POA: Insufficient documentation

## 2017-09-02 DIAGNOSIS — E669 Obesity, unspecified: Secondary | ICD-10-CM | POA: Insufficient documentation

## 2017-09-02 DIAGNOSIS — E785 Hyperlipidemia, unspecified: Secondary | ICD-10-CM | POA: Insufficient documentation

## 2017-09-02 DIAGNOSIS — Z79899 Other long term (current) drug therapy: Secondary | ICD-10-CM | POA: Insufficient documentation

## 2017-09-02 DIAGNOSIS — I10 Essential (primary) hypertension: Secondary | ICD-10-CM | POA: Insufficient documentation

## 2017-09-02 DIAGNOSIS — I252 Old myocardial infarction: Secondary | ICD-10-CM | POA: Insufficient documentation

## 2017-09-02 DIAGNOSIS — Z6835 Body mass index (BMI) 35.0-35.9, adult: Secondary | ICD-10-CM | POA: Insufficient documentation

## 2017-09-02 DIAGNOSIS — I214 Non-ST elevation (NSTEMI) myocardial infarction: Secondary | ICD-10-CM

## 2017-09-02 HISTORY — DX: Atherosclerotic heart disease of native coronary artery without angina pectoris: I25.10

## 2017-09-02 NOTE — Progress Notes (Signed)
Cardiac Individual Treatment Plan  Patient Details  Name: [name] [name] MRN: [id number] Date of Birth: 07-31-1966 Referring Provider:     CARDIAC REHAB PHASE II ORIENTATION [facility] 09[facility]02/2017 [facility]  Referring Provider  [name] BattyBerry, [name], MD      Initial Encounter Date:    CARDIAC REHAB PHASE II [facility]ATION [facility]  Date  09/02/17  Referring Provider  [name] BattyBerry, [name], MD      Visit Diagnosis: NSTEMI (non-ST elevated myocardial infarction) (HCC) 07/28/17  S/P CABG x 410/12/18  Patient's Home Medications on Admission:  Current Outpatient Medications:  .  aspirin 325 MG EC tablet, TAKE 1 TABLET BY MOUTH EVERY DAY, Disp: 30 tablet, Rfl: 6 .  atorvastatin (LIPITOR) 40 MG tablet, Take 1 tablet (40 mg total) by mouth daily., Disp: 90 tablet, Rfl: 3 .  glyBURIDE (DIABETA) 5 MG tablet, Take 5 mg by mouth daily., Disp: , Rfl:  .  guaiFENesin (MUCINEX) 600 MG 12 hr tablet, Take 1 tablet (600 mg total) by mouth 2 (two) times daily as needed., Disp: , Rfl:  .  isosorbide mononitrate (IMDUR) 30 MG 24 hr tablet, Take 0.5 tablets (15 mg total) by mouth daily., Disp: 14 tablet, Rfl: 0 .  losartan (COZAAR) 50 MG tablet, Take 50 mg by mouth daily., Disp: , Rfl:  .  metFORMIN (GLUCOPHAGE) 500 MG tablet, Take 1,000 mg by mouth 2 (two) times daily. , Disp: , Rfl:  .  metoprolol tartrate (LOPRESSOR) 50 MG tablet, Take 1 tablet (50 mg total) by mouth 2 (two) times daily., Disp: 60 tablet, Rfl: 3 .  Polyethyl Glycol-Propyl Glycol (SYSTANE OP), Place 1-2 drops into both eyes daily as needed (dry eyes). , Disp: , Rfl:  .  traMADol (ULTRAM) 50 MG tablet, Take 1-2 tablets (50-100 mg total) by mouth every 4 (four) hours as needed for moderate pain., Disp: 30 tablet, Rfl: 0 .  zolpidem (AMBIEN) 5 MG tablet, Take 1 tablet (5 mg total) by mouth at bedtime as needed for sleep., Disp: 15 tablet, Rfl: 0  Past Medical  History: Past Medical History:  Diagnosis Date  . Coronary artery disease   . Diabetes mellitus without complication (HCC)   . Hyperlipidemia   . Hypertension   . Obesity     Tobacco Use: Social History   Tobacco Use  Smoking Status Never Smoker  Smokeless Tobacco Never Used    Labs: Recent Review Flowsheet Data    Labs for ITP Cardiac and Pulmonary Rehab Latest Ref Rng & Units 07/30/2017 07/30/2017 07/30/2017 07/30/2017 07/31/2017   Cholestrol 0 - 200 mg/dL - - - - -   LDLCALC 0 - 99 mg/dL - - - - -   HDL >78>40 mg/dL - - - - -   Trlycerides <150 mg/dL - - - - -   Hemoglobin A1c 4.8 - 5.6 % - - - - -   PHART 7.350 - 7.450 7.446 - 7.475(H) 7.458(H) -   PCO2ART 32.0 - 48.0 mmHg 35.8 - 38.9 38.4 -   HCO3 20.0 - 28.0 mmol/L 24.7 - 28.4(H) 26.9 -   TCO2 22 - 32 mmol/L 26 24 30 28 24    ACIDBASEDEF 0.0 - 2.0 mmol/L - - - - -   O2SAT % 94.0 - 96.0 90.0 -      Capillary Blood Glucose: Lab Results  Component Value Date   GLUCAP 96 08/05/2017   GLUCAP 46 (L) 08/05/2017   GLUCAP 167 (  H) 08/05/2017   GLUCAP 134 (H) 08/05/2017   GLUCAP 69 08/04/2017     Exercise Target Goals: Date: 09/02/17  Exercise Program Goal: Individual exercise prescription set with THRR, safety & activity barriers. Participant demonstrates ability to understand and report RPE using BORG scale, to self-measure pulse accurately, and to acknowledge the importance of the exercise prescription.  Exercise Prescription Goal: Starting with aerobic activity 30 plus minutes a day, 3 days per week for initial exercise prescription. Provide home exercise prescription and guidelines that participant acknowledges understanding prior to discharge.  Activity Barriers & Risk Stratification: Activity Barriers & Cardiac Risk Stratification - 09/02/17 1057      Activity Barriers & Cardiac Risk Stratification   Activity Barriers  Deconditioning;Muscular Weakness;Incisional Pain       6 Minute Walk: 6 Minute Walk     Row Name 09/02/17 1056         6 Minute Walk   Phase  Initial     Distance  1158 feet     Walk Time  6 minutes     # of Rest Breaks  0     MPH  2.19     METS  3.54     RPE  13     VO2 Peak  12.39     Symptoms  No     Resting HR  100 bpm     Resting BP  124/78     Resting Oxygen Saturation   97 %     Exercise Oxygen Saturation  during 6 min walk  98 %     Max Ex. HR  126 bpm     Max Ex. BP  144/92     2 Minute Post BP  130/80        Oxygen Initial Assessment:   Oxygen Re-Evaluation:   Oxygen Discharge (Final Oxygen Re-Evaluation):   Initial Exercise Prescription: Initial Exercise Prescription - 09/02/17 1000      Date of Initial Exercise RX and Referring Provider   Date  09/02/17    Referring Provider  [name] Batty, MD      Recumbant Bike   Level  2    Minutes  10    METs  2.5      NuStep   Level  3    SPM  80    Minutes  10    METs  2.5      Track   Laps  9    Minutes  10    METs  2.57      Prescription Details   Frequency (times per week)  3    Duration  Progress to 30 minutes of continuous aerobic without signs/symptoms of physical distress      Intensity   THRR 40-80% of Max Heartrate  68-135    Ratings of Perceived Exertion  11-13    Perceived Dyspnea  0-4      Progression   Progression  Continue to progress workloads to maintain intensity without signs/symptoms of physical distress.      Resistance Training   Training Prescription  Yes    Weight  2lbs    Reps  10-15       Perform Capillary Blood Glucose checks as needed.  Exercise Prescription Changes:   Exercise Comments:   Exercise Goals and Review:  Exercise Goals    Row Name 09/02/17 0853             Exercise Goals   Increase Physical Activity  Yes       Intervention  Provide advice, education, support and counseling about physical activity/exercise needs.;Develop an individualized exercise prescription for aerobic and resistive training based on initial  evaluation findings, risk stratification, comorbidities and participant's personal goals.       Expected Outcomes  Achievement of increased cardiorespiratory fitness and enhanced flexibility, muscular endurance and strength shown through measurements of functional capacity and personal statement of participant.       Increase Strength and Stamina  Yes       Intervention  Provide advice, education, support and counseling about physical activity/exercise needs.;Develop an individualized exercise prescription for aerobic and resistive training based on initial evaluation findings, risk stratification, comorbidities and participant's personal goals.       Expected Outcomes  Achievement of increased cardiorespiratory fitness and enhanced flexibility, muscular endurance and strength shown through measurements of functional capacity and personal statement of participant.       Able to understand and use rate of perceived exertion (RPE) scale  Yes       Intervention  Provide education and explanation on how to use RPE scale       Expected Outcomes  Short Term: Able to use RPE daily in rehab to express subjective intensity level;Long Term:  Able to use RPE to guide intensity level when exercising independently       Knowledge and understanding of Target Heart Rate Range (THRR)  Yes       Intervention  Provide education and explanation of THRR including how the numbers were predicted and where they are located for reference       Expected Outcomes  Short Term: Able to state/look up THRR;Long Term: Able to use THRR to govern intensity when exercising independently;Short Term: Able to use daily as guideline for intensity in rehab       Able to check pulse independently  Yes       Intervention  Provide education and demonstration on how to check pulse in carotid and radial arteries.;Review the importance of being able to check your own pulse for safety during independent exercise       Expected Outcomes  Short Term:  Able to explain why pulse checking is important during independent exercise;Long Term: Able to check pulse independently and accurately       Understanding of Exercise Prescription  Yes       Intervention  Provide education, explanation, and written materials on patient's individual exercise prescription       Expected Outcomes  Short Term: Able to explain program exercise prescription;Long Term: Able to explain home exercise prescription to exercise independently          Exercise Goals Re-Evaluation :    Discharge Exercise Prescription (Final Exercise Prescription Changes):   Nutrition:  Target Goals: Understanding of nutrition guidelines, daily intake of sodium 1500mg , cholesterol 200mg , calories 30% from fat and 7% or less from saturated fats, daily to have 5 or more servings of fruits and vegetables.  Biometrics: Pre Biometrics - 09/02/17 1221      Pre Biometrics   Height  5' (1.524 m)    Weight  182 lb 12.2 oz (82.9 kg)    Waist Circumference  37.5 inches    Hip Circumference  44 inches    Waist to Hip Ratio  0.85 %    BMI (Calculated)  35.69    Triceps Skinfold  41 mm    % Body Fat  45.8 %    Grip Strength  18 kg    Flexibility  10 in    Single Leg Stand  30 seconds        Nutrition Therapy Plan and Nutrition Goals: Nutrition Therapy & Goals - 09/02/17 1052      Nutrition Therapy   Diet  Carb Modified, Mediterranean      Personal Nutrition Goals   Nutrition Goal  Pt to identify food quantities necessary to achieve weight loss of 6-24 lb at graduation from cardiac rehab. Long-term wt loss goal wt of 140 lb desired.     Personal Goal #2  CBG concentrations in the normal range or as close to normal as is safely possible.      Intervention Plan   Intervention  Prescribe, educate and counsel regarding individualized specific dietary modifications aiming towards targeted core components such as weight, hypertension, lipid management, diabetes, heart failure and other  comorbidities.    Expected Outcomes  Short Term Goal: Understand basic principles of dietary content, such as calories, fat, sodium, cholesterol and nutrients.;Long Term Goal: Adherence to prescribed nutrition plan.       Nutrition Discharge: Nutrition Scores:   Nutrition Goals Re-Evaluation:   Nutrition Goals Re-Evaluation:   Nutrition Goals Discharge (Final Nutrition Goals Re-Evaluation):   Psychosocial: Target Goals: Acknowledge presence or absence of significant depression and/or stress, maximize coping skills, provide positive support system. Participant is able to verbalize types and ability to use techniques and skills needed for reducing stress and depression.  Initial Review & Psychosocial Screening: Initial Psych Review & Screening - 09/02/17 1216      Initial Review   Current issues with  Current Stress Concerns    Source of Stress Concerns  Chronic Illness;Family    Comments  Patient says she is having difficulty swallowing      Family Dynamics   Good Support System?  Yes      Barriers   Psychosocial barriers to participate in program  The patient should benefit from training in stress management and relaxation.      Screening Interventions   Interventions  Encouraged to exercise;To provide support and resources with identified psychosocial needs;Provide feedback about the scores to participant       Quality of Life Scores:   PHQ-9: Recent Review Flowsheet Data    Depression screen [facility] 2/9 01/10/2014   Decreased Interest 0   Down, Depressed, Hopeless 0   PHQ - 2 Score 0     Interpretation of Total Score  Total Score Depression Severity:  1-4 = Minimal depression, 5-9 = Mild depression, 10-14 = Moderate depression, 15-19 = Moderately severe depression, 20-27 = Severe depression   Psychosocial Evaluation and Intervention:   Psychosocial Re-Evaluation:   Psychosocial Discharge (Final Psychosocial Re-Evaluation):   Vocational  Rehabilitation: Provide vocational rehab assistance to qualifying candidates.   Vocational Rehab Evaluation & Intervention:   Education: Education Goals: Education classes will be provided on a weekly basis, covering required topics. Participant will state understanding/return demonstration of topics presented.  Learning Barriers/Preferences: Learning Barriers/Preferences - 09/02/17 8119      Learning Barriers/Preferences   Learning Barriers  Sight glasses    Learning Preferences  Skilled Demonstration;Written Material;Video;Pictoral       Education Topics: Count Your Pulse:  -Group instruction provided by verbal instruction, demonstration, patient participation and written materials to support subject.  Instructors address importance of being able to find your pulse and how to count your pulse when at home without a heart monitor.  Patients get hands on experience counting  their pulse with staff help and individually.   Heart Attack, Angina, and Risk Factor Modification:  -Group instruction provided by verbal instruction, video, and written materials to support subject.  Instructors address signs and symptoms of angina and heart attacks.    Also discuss risk factors for heart disease and how to make changes to improve heart health risk factors.   Functional Fitness:  -Group instruction provided by verbal instruction, demonstration, patient participation, and written materials to support subject.  Instructors address safety measures for doing things around the house.  Discuss how to get up and down off the floor, how to pick things up properly, how to safely get out of a chair without assistance, and balance training.   Meditation and Mindfulness:  -Group instruction provided by verbal instruction, patient participation, and written materials to support subject.  Instructor addresses importance of mindfulness and meditation practice to help reduce stress and improve awareness.   Instructor also leads participants through a meditation exercise.    Stretching for Flexibility and Mobility:  -Group instruction provided by verbal instruction, patient participation, and written materials to support subject.  Instructors lead participants through series of stretches that are designed to increase flexibility thus improving mobility.  These stretches are additional exercise for major muscle groups that are typically performed during regular warm up and cool down.   Hands Only CPR:  -Group verbal, video, and participation provides a basic overview of AHA guidelines for community CPR. Role-play of emergencies allow participants the opportunity to practice calling for help and chest compression technique with discussion of AED use.   Hypertension: -Group verbal and written instruction that provides a basic overview of hypertension including the most recent diagnostic guidelines, risk factor reduction with self-care instructions and medication management.    Nutrition I class: Heart Healthy Eating:  -Group instruction provided by PowerPoint slides, verbal discussion, and written materials to support subject matter. The instructor gives an explanation and review of the Therapeutic Lifestyle Changes diet recommendations, which includes a discussion on lipid goals, dietary fat, sodium, fiber, plant stanol/sterol esters, sugar, and the components of a well-balanced, healthy diet.   Nutrition II class: Lifestyle Skills:  -Group instruction provided by PowerPoint slides, verbal discussion, and written materials to support subject matter. The instructor gives an explanation and review of label reading, grocery shopping for heart health, heart healthy recipe modifications, and ways to make healthier choices when eating out.   Diabetes Question & Answer:  -Group instruction provided by PowerPoint slides, verbal discussion, and written materials to support subject matter. The instructor  gives an explanation and review of diabetes co-morbidities, pre- and post-prandial blood glucose goals, pre-exercise blood glucose goals, signs, symptoms, and treatment of hypoglycemia and hyperglycemia, and foot care basics.   Diabetes Blitz:  -Group instruction provided by PowerPoint slides, verbal discussion, and written materials to support subject matter. The instructor gives an explanation and review of the physiology behind type 1 and type 2 diabetes, diabetes medications and rational behind using different medications, pre- and post-prandial blood glucose recommendations and Hemoglobin A1c goals, diabetes diet, and exercise including blood glucose guidelines for exercising safely.    Portion Distortion:  -Group instruction provided by PowerPoint slides, verbal discussion, written materials, and food models to support subject matter. The instructor gives an explanation of serving size versus portion size, changes in portions sizes over the last 20 years, and what consists of a serving from each food group.   Stress Management:  -Group instruction provided by verbal instruction,  video, and written materials to support subject matter.  Instructors review role of stress in heart disease and how to cope with stress positively.     Exercising on Your Own:  -Group instruction provided by verbal instruction, power point, and written materials to support subject.  Instructors discuss benefits of exercise, components of exercise, frequency and intensity of exercise, and end points for exercise.  Also discuss use of nitroglycerin and activating EMS.  Review options of places to exercise outside of rehab.  Review guidelines for sex with heart disease.   Cardiac Drugs I:  -Group instruction provided by verbal instruction and written materials to support subject.  Instructor reviews cardiac drug classes: antiplatelets, anticoagulants, beta blockers, and statins.  Instructor discusses reasons, side  effects, and lifestyle considerations for each drug class.   Cardiac Drugs II:  -Group instruction provided by verbal instruction and written materials to support subject.  Instructor reviews cardiac drug classes: angiotensin converting enzyme inhibitors (ACE-I), angiotensin II receptor blockers (ARBs), nitrates, and calcium channel blockers.  Instructor discusses reasons, side effects, and lifestyle considerations for each drug class.   Anatomy and Physiology of the Circulatory System:  Group verbal and written instruction and models provide basic cardiac anatomy and physiology, with the coronary electrical and arterial systems. Review of: AMI, Angina, Valve disease, Heart Failure, Peripheral Artery Disease, Cardiac Arrhythmia, Pacemakers, and the ICD.   Other Education:  -Group or individual verbal, written, or video instructions that support the educational goals of the cardiac rehab program.   Knowledge Questionnaire Score:   Core Components/Risk Factors/Patient Goals at Admission: Personal Goals and Risk Factors at Admission - 09/02/17 1104      Core Components/Risk Factors/Patient Goals on Admission    Weight Management  Yes;Obesity;Weight Maintenance;Weight Loss    Intervention  Weight Management: Develop a combined nutrition and exercise program designed to reach desired caloric intake, while maintaining appropriate intake of nutrient and fiber, sodium and fats, and appropriate energy expenditure required for the weight goal.;Weight Management: Provide education and appropriate resources to help participant work on and attain dietary goals.;Weight Management/Obesity: Establish reasonable short term and long term weight goals.    Admit Weight  182 lb 12.2 oz (82.9 kg)    Goal Weight: Short Term  160 lb (72.6 kg)    Goal Weight: Long Term  140 lb (63.5 kg)    Expected Outcomes  Short Term: Continue to assess and modify interventions until short term weight is achieved;Long Term:  Adherence to nutrition and physical activity/exercise program aimed toward attainment of established weight goal;Weight Maintenance: Understanding of the daily nutrition guidelines, which includes 25-35% calories from fat, 7% or less cal from saturated fats, less than 200mg  cholesterol, less than 1.5gm of sodium, & 5 or more servings of fruits and vegetables daily;Weight Loss: Understanding of general recommendations for a balanced deficit meal plan, which promotes 1-2 lb weight loss per week and includes a negative energy balance of [phone] kcal/d;Understanding of distribution of calorie intake throughout the day with the consumption of 4-5 meals/snacks;Understanding recommendations for meals to include 15-35% energy as protein, 25-35% energy from fat, 35-60% energy from carbohydrates, less than 200mg  of dietary cholesterol, 20-35 gm of total fiber daily    Diabetes  Yes    Intervention  Provide education about signs/symptoms and action to take for hypo/hyperglycemia.;Provide education about proper nutrition, including hydration, and aerobic/resistive exercise prescription along with prescribed medications to achieve blood glucose in normal ranges: Fasting glucose 65-99 mg/dL    Expected Outcomes  Short Term: Participant verbalizes understanding of the signs/symptoms and immediate care of hyper/hypoglycemia, proper foot care and importance of medication, aerobic/resistive exercise and nutrition plan for blood glucose control.;Long Term: Attainment of HbA1C < 7%.    Hypertension  Yes    Intervention  Monitor prescription use compliance.;Provide education on lifestyle modifcations including regular physical activity/exercise, weight management, moderate sodium restriction and increased consumption of fresh fruit, vegetables, and low fat dairy, alcohol moderation, and smoking cessation.    Expected Outcomes  Short Term: Continued assessment and intervention until BP is < 140/11mm HG in hypertensive  participants. < 130/12mm HG in hypertensive participants with diabetes, heart failure or chronic kidney disease.;Long Term: Maintenance of blood pressure at goal levels.    Lipids  Yes    Intervention  Provide education and support for participant on nutrition & aerobic/resistive exercise along with prescribed medications to achieve LDL 70mg , HDL >40mg .    Expected Outcomes  Short Term: Participant states understanding of desired cholesterol values and is compliant with medications prescribed. Participant is following exercise prescription and nutrition guidelines.;Long Term: Cholesterol controlled with medications as prescribed, with individualized exercise RX and with personalized nutrition plan. Value goals: LDL < 70mg , HDL > 40 mg.    Stress  Yes    Intervention  Offer individual and/or small group education and counseling on adjustment to heart disease, stress management and health-related lifestyle change. Teach and support self-help strategies.;Refer participants experiencing significant psychosocial distress to appropriate mental health specialists for further evaluation and treatment. When possible, include family members and significant others in education/counseling sessions.    Expected Outcomes  Short Term: Participant demonstrates changes in health-related behavior, relaxation and other stress management skills, ability to obtain effective social support, and compliance with psychotropic medications if prescribed.;Long Term: Emotional wellbeing is indicated by absence of clinically significant psychosocial distress or social isolation.       Core Components/Risk Factors/Patient Goals Review:    Core Components/Risk Factors/Patient Goals at Discharge (Final Review):    ITP Comments: ITP Comments    Row Name 09/02/17 0849           ITP Comments  Dr. Armanda Magic, Medical Director          Comments: Skyleen attended orientation from [phone] to 0957 to review rules and guidelines for  program. Completed 6 minute walk test, Intitial ITP, and exercise prescription.  VSS. Telemetry-Sinus Tach this has been previously documented.  Asymptomatic.Gladstone Lighter, RN,BSN 09/02/2017 12:36 PM

## 2017-09-02 NOTE — Progress Notes (Signed)
[name] [name] 51 y.o. female DOB: 02-22-1966 MRN: [id number]      Nutrition Note  1. NSTEMI (non-ST elevated myocardial infarction) (HCC)   2. S/P CABG x 4    Past Medical History:  Diagnosis Date  . Diabetes mellitus without complication (HCC)   . Hyperlipidemia   . Hypertension   . Obesity    Meds reviewed. Glyburide, Metformin noted  HT: Ht Readings from Last 1 Encounters:  08/31/17 5' (1.524 m)    WT: Wt Readings from Last 3 Encounters:  08/31/17 183 lb (83 kg)  08/19/17 183 lb (83 kg)  08/06/17 190 lb 1.6 oz (86.2 kg)     BMI 35.7   Current tobacco use? No  Labs:  Lipid Panel     Component Value Date/Time   CHOL 237 (H) 07/28/2017 1945   TRIG 169 (H) 07/28/2017 1945   HDL 36 (L) 07/28/2017 1945   CHOLHDL 6.6 07/28/2017 1945   VLDL 34 07/28/2017 1945   LDLCALC 167 (H) 07/28/2017 1945    Lab Results  Component Value Date   HGBA1C 7.1 (H) 07/29/2017   CBG (last 3)  No results for input(s): GLUCAP in the last 72 hours.  Nutrition Note Spoke with pt. Nutrition plan and goals reviewed with pt. Pt wants to lose wt. Wt loss tips reviewed. Pt is diabetic. Last A1c indicates blood glucose not optimally controlled. Per discussion, pt c/o frequent episodes of hypoglycemia since CABG. CBG's have been "as low as 43 mg/dL even when I've eaten." Pt has an appt with her PCP today and she plans on discussing hypoglycemia at that appointment. Pt expressed understanding of the information reviewed. Pt aware of nutrition education classes offered and plans on attending nutrition classes.  Nutrition Diagnosis ? Food-and nutrition-related knowledge deficit related to lack of exposure to information as related to diagnosis of: ? CVD ? DM ? Obesity related to excessive energy intake as evidenced by a BMI of 35.7  Nutrition Intervention ? Pt's individual nutrition plan and goals reviewed with pt. ? Pt to discuss decreasing Glyburide or a trial of holding Glyburide due to  frequency of hypoglycemia.   Nutrition Goal(s):  ? Pt to identify food quantities necessary to achieve weight loss of 6-24 lb at graduation from cardiac rehab. Long-term wt loss goal wt of 140 lb desired.  ? CBG concentrations in the normal range or as close to normal as is safely possible.  Plan:  Pt to attend nutrition classes ? Nutrition I ? Nutrition II ? Portion Distortion ? Diabetes Blitz ? Diabetes Q & A Will provide client-centered nutrition education as part of interdisciplinary care.   Monitor and evaluate progress toward nutrition goal with team.  [name] [name], M.Ed, RD, LDN, CDE 09/02/2017 10:38 AM

## 2017-09-06 ENCOUNTER — Encounter (HOSPITAL_COMMUNITY)
Admission: RE | Admit: 2017-09-06 | Discharge: 2017-09-06 | Disposition: A | Discharge status: Home / Self Care | Payer: 59 | Source: Ambulatory Visit | Attending: Cardiovascular Disease | Admitting: Cardiovascular Disease

## 2017-09-06 ENCOUNTER — Encounter (HOSPITAL_COMMUNITY): Payer: 59

## 2017-09-06 DIAGNOSIS — I10 Essential (primary) hypertension: Secondary | ICD-10-CM | POA: Diagnosis not present

## 2017-09-06 DIAGNOSIS — E119 Type 2 diabetes mellitus without complications: Secondary | ICD-10-CM | POA: Diagnosis not present

## 2017-09-06 DIAGNOSIS — Z79899 Other long term (current) drug therapy: Secondary | ICD-10-CM | POA: Diagnosis not present

## 2017-09-06 DIAGNOSIS — E785 Hyperlipidemia, unspecified: Secondary | ICD-10-CM | POA: Diagnosis not present

## 2017-09-06 DIAGNOSIS — Z951 Presence of aortocoronary bypass graft: Secondary | ICD-10-CM | POA: Diagnosis not present

## 2017-09-06 DIAGNOSIS — I252 Old myocardial infarction: Secondary | ICD-10-CM | POA: Diagnosis not present

## 2017-09-06 DIAGNOSIS — E669 Obesity, unspecified: Secondary | ICD-10-CM | POA: Diagnosis not present

## 2017-09-06 DIAGNOSIS — I214 Non-ST elevation (NSTEMI) myocardial infarction: Secondary | ICD-10-CM

## 2017-09-06 DIAGNOSIS — Z7984 Long term (current) use of oral hypoglycemic drugs: Secondary | ICD-10-CM | POA: Diagnosis not present

## 2017-09-06 DIAGNOSIS — Z6835 Body mass index (BMI) 35.0-35.9, adult: Secondary | ICD-10-CM | POA: Diagnosis not present

## 2017-09-06 DIAGNOSIS — Z7982 Long term (current) use of aspirin: Secondary | ICD-10-CM | POA: Diagnosis not present

## 2017-09-06 LAB — GLUCOSE, CAPILLARY
Glucose-Capillary: 185 mg/dL — ABNORMAL HIGH (ref 65–99)
Glucose-Capillary: 135 mg/dL — ABNORMAL HIGH (ref 65–99)

## 2017-09-06 NOTE — Progress Notes (Signed)
Daily Session Note  Patient Details  Name: CLARISSE RODRIGES MRN: [id number] Date of Birth: 23-May-1966 Referring Provider:     CARDIAC REHAB PHASE II ORIENTATION from 09/02/2017 in [facility]  Referring Provider  Quay Burow, MD      Encounter Date: 09/06/2017  Check In: Session Check In - 09/06/17 1033      Check-In   Location  [facility]    Staff Present  Dorna Bloom, MS, ACSM RCEP, Exercise Physiologist;Portia [place], RN, BSN;Joann Rion, RN, [employer], RN, Deland Pretty, MS, ACSM CEP, Exercise Physiologist    Supervising physician immediately available to respond to emergencies  [facility] immediately available    Physician(s)  Dr. Maylene Roes     Medication changes reported      No    Fall or balance concerns reported     No    Tobacco Cessation  No Change    Warm-up and Cool-down  Performed as group-led instruction    Resistance Training Performed  Yes    VAD Patient?  No      Pain Assessment   Currently in Pain?  No/denies       Capillary Blood Glucose: Results for orders placed or performed during the hospital encounter of 09/06/17 (from the past 24 hour(s))  Glucose, capillary     Status: Abnormal   Collection Time: 09/06/17  9:54 AM  Result Value Ref Range   Glucose-Capillary 185 (H) 65 - 99 mg/dL  Glucose, capillary     Status: Abnormal   Collection Time: 09/06/17 10:42 AM  Result Value Ref Range   Glucose-Capillary 135 (H) 65 - 99 mg/dL      Social History   Tobacco Use  Smoking Status Never Smoker  Smokeless Tobacco Never Used    Goals Met:  Exercise tolerated well  Goals Unmet:  Not Applicable  Comments: Edris started cardiac rehab today.  Pt tolerated light exercise without difficulty. VSS, telemetry-Sinus Rhtyhm , asymptomatic.  Medication list reconciled. Pt denies barriers to medicaiton compliance.  PSYCHOSOCIAL ASSESSMENT:  PHQ-0. Pt exhibits positive coping skills, hopeful  outlook with supportive family. No psychosocial needs identified at this time, no psychosocial interventions necessary.    Pt enjoys cooking.   Pt oriented to exercise equipment and routine.    Understanding verbalized. Weda did say her rate of perceived exertion was a 16 on the recumbent bike. Cherryl says she has not been exercising at home since her surgery but has done some walking at the stores. Exit blood pressure 122/90. Recheck blood pressure 122/86 after resting. Will continue to monitor the patient throughout  the program.     Dr. Fransico Him is Medical Director for Cardiac Rehab at [facility].

## 2017-09-07 ENCOUNTER — Other Ambulatory Visit: Payer: Self-pay | Admitting: Cardiology

## 2017-09-07 ENCOUNTER — Telehealth (HOSPITAL_COMMUNITY): Payer: Self-pay | Admitting: *Deleted

## 2017-09-07 NOTE — Telephone Encounter (Signed)
-----   Message from Abelino DerrickLuke K Kilroy, [place]PA-C sent at 09/07/2017 11:39 AM EST ----- Regarding: RE: isosorbide She was on Imdur for her radial artery graft. Per Dr Hendrickson's note- she can stop the Imdur once her Rx runs out.  Corine ShelterLUKE KILROY PA-C 09/07/2017 11:39 AM ----- Message ----- From: Cammy CopaWhitaker, Jerral Mccauley W, RN Sent: 09/07/2017  11:20 AM To: Abelino DerrickLuke K Kilroy, PA-C Subject: isosorbide                                     Good morning Floria RavelingLuke,  Shequita started cardiac rehab yesterday. Delice Bisonara took her last isosorbide yesterday. From your office note I wanted to ask you does she need to continue taking the isosorbide? And id so for how long?  If you would like her to continue taking her isosorbide will you please call in some more refills to her pharmacy?  Her blood pressures from yesterday are as follows  Heart rate 96 Entry BP 142/84 Heart rate 105         BP 126/82 track Heart rate 120         BP 142/70 recumbent bike Heart rate 122         BP  128/80 nustep Heart rate 96           BP 122/90 exit BP recheck 122/86   Thanks for your assistance Enid CutterLuke,  [facility] [phone]

## 2017-09-07 NOTE — Telephone Encounter (Signed)
REFILL 

## 2017-09-08 ENCOUNTER — Ambulatory Visit (HOSPITAL_COMMUNITY)
Admission: EM | Admit: 2017-09-08 | Discharge: 2017-09-08 | Disposition: A | Discharge status: Home / Self Care | Payer: 59 | Attending: Family Medicine | Admitting: Family Medicine

## 2017-09-08 ENCOUNTER — Telehealth: Payer: Self-pay | Admitting: Cardiology

## 2017-09-08 ENCOUNTER — Other Ambulatory Visit: Payer: Self-pay

## 2017-09-08 ENCOUNTER — Ambulatory Visit (INDEPENDENT_AMBULATORY_CARE_PROVIDER_SITE_OTHER): Payer: 59

## 2017-09-08 ENCOUNTER — Encounter (HOSPITAL_COMMUNITY)
Admission: RE | Admit: 2017-09-08 | Discharge: 2017-09-08 | Disposition: A | Discharge status: Home / Self Care | Payer: 59 | Source: Ambulatory Visit | Attending: Cardiovascular Disease | Admitting: Cardiovascular Disease

## 2017-09-08 ENCOUNTER — Encounter (HOSPITAL_COMMUNITY): Payer: 59

## 2017-09-08 ENCOUNTER — Encounter (HOSPITAL_COMMUNITY): Payer: Self-pay | Admitting: Emergency Medicine

## 2017-09-08 DIAGNOSIS — K5900 Constipation, unspecified: Secondary | ICD-10-CM

## 2017-09-08 DIAGNOSIS — I252 Old myocardial infarction: Secondary | ICD-10-CM | POA: Diagnosis not present

## 2017-09-08 DIAGNOSIS — I214 Non-ST elevation (NSTEMI) myocardial infarction: Secondary | ICD-10-CM

## 2017-09-08 DIAGNOSIS — Z951 Presence of aortocoronary bypass graft: Secondary | ICD-10-CM

## 2017-09-08 LAB — GLUCOSE, CAPILLARY: Glucose-Capillary: 135 mg/dL — ABNORMAL HIGH (ref 65–99)

## 2017-09-08 MED ORDER — POLYETHYLENE GLYCOL 3350 17 G PO PACK: 17.0000 g | PACK | Freq: Every day | ORAL | 0 refills | Status: DC

## 2017-09-08 NOTE — Progress Notes (Addendum)
Incomplete Session Note  Patient Details  Name: [name] [name] Thien MRN: [id number] Date of Birth: Aug 03, 1966 Referring Provider:     CARDI[facility]  Referring Provider  Nanetta BattyBerry, [name], MD      [name] [name] Klausing did not complete her rehab session.  Entry blood pressure 120/80 with a resting heart rate of 95. Blood pressure was 142/98 with the large BP cuff on the walking track. Exercise stopped. Delice Bisonara said she took her medications as prescribed today. Delice Bisonara is not taking her isosorbide anymore as her prescription has ended. Laverda PageLindsay Roberts NP called and notified.  Lillia AbedLindsay instructed Delice Bisonara to increase her metoprolol to 75 mg twice a day. Lillia AbedLindsay also said she make an appointment for Delice Bisonara to be seen in the Hypertension clinic next week. Jonique's exit blood pressure was 118/94.Will continue to monitor the patient throughout  the program.Maria Harlon FlorWhitaker, RN,BSN 09/08/2017 12:24 PM

## 2017-09-08 NOTE — Telephone Encounter (Signed)
Talked with patient at cardiac rehab. Blood pressures were noted to be elevated with diastolic in the 100s. Currently on metoprolol 50mg  BID and resting HR in the 90s. Asked patient to increase to 75mg  BID and will have her follow up in the clinic next week for blood pressure check. Patient voiced understanding and agreed to plan. Asked her to check blood pressure and HR at home.

## 2017-09-08 NOTE — ED Triage Notes (Signed)
Pt c/o LLQ abdominal pain, cramping, with some loose stools.

## 2017-09-08 NOTE — ED Provider Notes (Signed)
[facility]    CSN: [id number] Arrival date & time: 09/08/17  1503     History   Chief Complaint Chief Complaint  Patient presents with  . Abdominal Pain    HPI [name] [name] is a 51 y.o. female.   [name] [name] presents with complaints of left lower abdominal pain which started 11/18. She states normally for her she has loose stools, approximately 1 a day. She developed cramping on 11/18 and passed a loose stool as well as hard ball of stool, took pepto bismul. She states since then she has been unable to pass normal stool, she experiences cramping but then minimal output. She states she had a very small stool approximately 1.5 hours prior to arrival. Without mucus to stool, no fevers. Denies nausea or vomiting. States that pain does increase after eating. She has been able to eat and drink. Pain waxes and wanes. Rates pain 8/10. No known ill contacts. She was in the hospital for bypass 10/10. No antibiotics since. Was taking tramadol for pain, last dose was approximately 11/16. She states her baseline heart rate in the 100's, which appears to be consistent with her last cardiology visit. Her BP medication was increased today as she had increased BP this morning at cardiac rehab. Denies change to urination, denies vaginal discharge or symptoms.    ROS per HPI.       Past Medical History:  Diagnosis Date  . Coronary artery disease   . Diabetes mellitus without complication (HCC)   . Hyperlipidemia   . Hypertension   . Obesity     Patient Active Problem List   Diagnosis Date Noted  . Dyslipidemia 08/19/2017  . Diabetes mellitus type II, non insulin dependent (HCC) 08/19/2017  . 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 History of NSTEMI 07/28/2017  . Hypercalcemia 10/05/2016  . Essential hypertension 10/05/2016    Past Surgical History:  Procedure Laterality Date  . CARDIAC CATHETERIZATION    . CESAREAN SECTION    . CORONARY ARTERY BYPASS GRAFT N/A 07/30/2017   Procedure:  CORONARY ARTERY BYPASS GRAFTING (CABG)  x four, using right internal mammary artery, right greater saphenous vein, left radial artery;  Surgeon: Loreli Slot, MD;  Location: [facility] OR;  Service: Open Heart Surgery;  Laterality: N/A;  . INTRAOPERATIVE TRANSESOPHAGEAL ECHOCARDIOGRAM N/A 07/30/2017   Procedure: INTRAOPERATIVE TRANSESOPHAGEAL ECHOCARDIOGRAM;  Surgeon: Loreli Slot, MD;  Location: [facility] OR;  Service: Open Heart Surgery;  Laterality: N/A;  . LEFT HEART CATH AND CORONARY ANGIOGRAPHY N/A 07/29/2017   Procedure: LEFT HEART CATH AND CORONARY ANGIOGRAPHY;  Surgeon: [place]az[place], Peter M, MD;  Location: [facility] INVASIVE CV LAB;  Service: Cardiovascular;  Laterality: N/A;  . PARTIAL HYSTERECTOMY  2005  . RADIAL ARTERY HARVEST Left 07/30/2017   Procedure: RADIAL ARTERY HARVEST;  Surgeon: Loreli Slot, MD;  Location: [facility] OR;  Service: Open Heart Surgery;  Laterality: Left;    OB History    No data available       Home Medications    Prior to Admission medications   Medication Sig Start Date End Date Taking? Authorizing Provider  aspirin 325 MG EC tablet TAKE 1 TABLET BY MOUTH EVERY DAY 09/02/17  Yes Loreli Slot, MD  atorvastatin (LIPITOR) 40 MG tablet Take 1 tablet (40 mg total) by mouth daily. 08/19/17 11/17/17 Yes Kilroy, Luke K, PA-C  glyBURIDE (DIABETA) 5 MG tablet Take 5 mg by mouth daily. 07/12/17  Yes [provider]  guaiFENesin (MUCINEX) 600 MG 12  hr tablet Take 1 tablet (600 mg total) by mouth 2 (two) times daily as needed. 08/06/17  Yes Barrett, Erin R, PA-C  losartan (COZAAR) 50 MG tablet Take 50 mg by mouth daily. 07/12/17  Yes [provider]  metFORMIN (GLUCOPHAGE) 500 MG tablet Take 1,000 mg by mouth 2 (two) times daily.    Yes [provider]  metoprolol tartrate (LOPRESSOR) 50 MG tablet Take 1 tablet (50 mg total) by mouth 2 (two) times daily. Patient taking differently: Take 75 mg by mouth 2 (two) times daily. Take one and a  half tablet twice a day 08/06/17  Yes Barrett, Erin R, PA-C  traMADol (ULTRAM) 50 MG tablet Take 1-2 tablets (50-100 mg total) by mouth every 4 (four) hours as needed for moderate pain. 08/06/17  Yes Barrett, Erin R, PA-C  zolpidem (AMBIEN) 5 MG tablet Take 1 tablet (5 mg total) by mouth at bedtime as needed for sleep. 08/19/17  Yes Kilroy, Luke K, PA-C  isosorbide mononitrate (IMDUR) 30 MG 24 hr tablet TAKE 1/2 TABLET BY MOUTH DAILY 09/07/17   Kilroy, Franky MachoLuke K, PA-C  polyethylene glycol (MIRALAX / GLYCOLAX) packet Take 17 g by mouth daily. 09/08/17   Georgetta HaberBurky, Natalie B, NP    Family History Family History  Problem Relation Age of Onset  . Hypertension Other     Social History Social History   Tobacco Use  . Smoking status: Never Smoker  . Smokeless tobacco: Never Used  Substance Use Topics  . Alcohol use: Yes  . Drug use: No     Allergies   Penicillins   Review of Systems Review of Systems   Physical Exam Triage Vital Signs ED Triage Vitals  Enc Vitals Group     BP 09/08/17 1510 (!) 176/114     Pulse Rate 09/08/17 1510 (!) 101     Resp 09/08/17 1510 20     Temp 09/08/17 1510 97.6 F (36.4 C)     Temp src --      SpO2 09/08/17 1510 97 %     Weight --      Height --      Head Circumference --      Peak Flow --      Pain Score 09/08/17 1511 3     Pain Loc --      Pain Edu? --      Excl. in GC? --    No data found.  Updated Vital Signs BP (!) 176/114   Pulse (!) 101   Temp 97.6 F (36.4 C)   Resp 20   SpO2 97%   Visual Acuity Right Eye Distance:   Left Eye Distance:   Bilateral Distance:    Right Eye Near:   Left Eye Near:    Bilateral Near:     Physical Exam  Constitutional: She is oriented to person, place, and time. She appears well-developed and well-nourished. No distress.  Cardiovascular: Regular rhythm and normal heart sounds. Tachycardia present.  Pulmonary/Chest: Effort normal and breath sounds normal.  Abdominal: Soft. Bowel sounds are  normal. There is tenderness in the left lower quadrant. There is no CVA tenderness, no tenderness at McBurney's point and negative Murphy's sign.    Mid left abdominal tenderness on exam  Neurological: She is alert and oriented to person, place, and time.  Skin: Skin is warm and dry.     UC Treatments / Results  Labs (all labs ordered are listed, but only abnormal results are displayed) Labs Reviewed -  No data to display  EKG  EKG Interpretation None       Radiology Dg Abd 2 Views  Result Date: 09/08/2017 CLINICAL DATA:  Abdominal pain, worse on the left. Changes stool patterns. EXAM: ABDOMEN - 2 VIEW COMPARISON:  None. FINDINGS: Stool is noted throughout the colon without obstruction. There is no free air. Left lower lobe opacity is noted. The heart is enlarged. Axial skeleton is within normal limits. IMPRESSION: 1. Normal bowel gas pattern 2. Cardiomegaly without failure. 3. Left lower lobe opacity likely reflecting atelectasis associated with cardiac enlargement. Electronically Signed   By: Marin Robertshristopher  Mattern M.D.   On: 09/08/2017 17:09    Procedures Procedures (including critical care time)  Medications Ordered in UC Medications - No data to display   Initial Impression / Assessment and Plan / UC Course  I have reviewed the triage vital signs and the nursing notes.  Pertinent labs & imaging results that were available during my care of the patient were reviewed by me and considered in my medical decision making (see chart for details).     Stool is noted throughout colon on xray, symptoms consistent with constipation. Miralax, push fluids. Return precautions provided. If symptoms worsen or do not improve in the next week to return to be seen or to follow up with PCP. Patient verbalized understanding and agreeable to plan.    Final Clinical Impressions(s) / UC Diagnoses   Final diagnoses:  Constipation, unspecified constipation type    ED Discharge Orders         Ordered    polyethylene glycol (MIRALAX / GLYCOLAX) packet  Daily     09/08/17 1720       Controlled Substance Prescriptions [place] Controlled Substance Registry consulted? Not Applicable   Georgetta HaberBurky, Natalie B, NP 09/08/17 1722    Georgetta HaberBurky, Natalie B, NP 09/08/17 1726

## 2017-09-08 NOTE — Discharge Instructions (Signed)
Increase fluid intake. Take 1 packet tonight and 1 tomorrow to promote large passage of stool. May take daily as needed or decrease if stool increases. If increased pain, fevers or no improvement of symptoms please return to be seen.

## 2017-09-10 ENCOUNTER — Encounter (HOSPITAL_COMMUNITY): Payer: 59

## 2017-09-13 ENCOUNTER — Encounter (HOSPITAL_COMMUNITY): Payer: 59

## 2017-09-14 NOTE — Telephone Encounter (Signed)
FW: HTN follow up appt  Received: 6 days ago  Message Contents  Julio SicksBowers, Jennifer L, LPN  P Cv Div Nl Clinical Pool      Previous Messages    ----- Message -----  From: Arty Baumgartneroberts, Lindsay B, NP  Sent: 09/08/2017 10:34 AM  To: Mickie Bailv Div [facility] Triage  Subject: HTN follow up appt                Please call the patient and arrange for follow up on HTN next week. Was at cardiac rehab and we adjusted her medications.   Thx

## 2017-09-14 NOTE — Telephone Encounter (Signed)
Patient has MD OV on 11/30 with Dr. Allyson SabalBerry

## 2017-09-15 ENCOUNTER — Encounter (HOSPITAL_COMMUNITY): Payer: 59

## 2017-09-17 ENCOUNTER — Encounter: Payer: Self-pay | Admitting: Cardiovascular Disease

## 2017-09-17 ENCOUNTER — Encounter (HOSPITAL_COMMUNITY): Payer: Self-pay | Admitting: *Deleted

## 2017-09-17 ENCOUNTER — Ambulatory Visit (INDEPENDENT_AMBULATORY_CARE_PROVIDER_SITE_OTHER): Payer: 59 | Admitting: Cardiovascular Disease

## 2017-09-17 ENCOUNTER — Encounter (HOSPITAL_COMMUNITY): Payer: 59

## 2017-09-17 VITALS — BP 156/110 | HR 104 | Ht 60.0 in | Wt 181.0 lb

## 2017-09-17 DIAGNOSIS — Z951 Presence of aortocoronary bypass graft: Secondary | ICD-10-CM

## 2017-09-17 DIAGNOSIS — E785 Hyperlipidemia, unspecified: Secondary | ICD-10-CM | POA: Diagnosis not present

## 2017-09-17 DIAGNOSIS — I214 Non-ST elevation (NSTEMI) myocardial infarction: Secondary | ICD-10-CM

## 2017-09-17 DIAGNOSIS — I10 Essential (primary) hypertension: Secondary | ICD-10-CM | POA: Diagnosis not present

## 2017-09-17 MED ORDER — LOSARTAN POTASSIUM 100 MG PO TABS: 100.0000 mg | ORAL_TABLET | Freq: Every day | ORAL | 6 refills | Status: DC

## 2017-09-17 MED ORDER — METOPROLOL SUCCINATE ER 100 MG PO TB24: 100.0000 mg | ORAL_TABLET | Freq: Two times a day (BID) | ORAL | 6 refills | Status: DC

## 2017-09-17 NOTE — Progress Notes (Signed)
09/17/2017 [name] [name]   1966/03/21  [id number]  Primary Physician Seward Carol, MD Primary Cardiologist: Lorretta Harp MD [name], [place]  HPI:  [name] [name] is a 51 y.o. moderately overweight married African-American female mother of one 19 year old daughter who I met during her hospitalization on 07/29/17 when she was admitted with a non-STEMI. She required recatheterization that day by Dr. Martinique revealing three-vessel disease with normal LV function and bypass grafting the following day by Dr. Roxan Hockey is a RIMA to the PDA, vein to the second diagonal branch and sequential radial to OM 1 and 2. Her postoperative course is uncooperative. She was discharged on 08/06/17. She is participating critically. She seek some Q-wave first postop visit changed her statin to Lipitor. She has been having difficulty with blood pressure control since discharge.    Current Meds  Medication Sig  . aspirin 325 MG EC tablet TAKE 1 TABLET BY MOUTH EVERY DAY  . atorvastatin (LIPITOR) 40 MG tablet Take 1 tablet (40 mg total) by mouth daily.  Marland Kitchen glyBURIDE (DIABETA) 5 MG tablet Take 5 mg by mouth daily.  Marland Kitchen losartan (COZAAR) 100 MG tablet Take 1 tablet (100 mg total) by mouth daily.  . metFORMIN (GLUCOPHAGE) 500 MG tablet Take 1,000 mg by mouth 2 (two) times daily.   . metoprolol succinate (TOPROL-XL) 100 MG 24 hr tablet Take 1 tablet (100 mg total) by mouth 2 (two) times daily. Take with or immediately following a meal.  . polyethylene glycol (MIRALAX / GLYCOLAX) packet Take 17 g by mouth daily.  . [DISCONTINUED] losartan (COZAAR) 50 MG tablet Take 50 mg by mouth daily.  . [DISCONTINUED] metoprolol succinate (TOPROL-XL) 50 MG 24 hr tablet Take 75 mg by mouth 2 (two) times daily. Take with or immediately following a meal.  . [DISCONTINUED] metoprolol tartrate (LOPRESSOR) 50 MG tablet Take 1 tablet (50 mg total) by mouth 2 (two) times daily. (Patient taking differently: Take 75 mg by  mouth 2 (two) times daily. Take one and a half tablet twice a day)     Allergies  Allergen Reactions  . Penicillins Hives    Social History   Socioeconomic History  . Marital status: Married    Spouse name: Not on file  . Number of children: Not on file  . Years of education: Not on file  . Highest education level: Not on file  Social Needs  . Financial resource strain: Not on file  . Food insecurity - worry: Not on file  . Food insecurity - inability: Not on file  . Transportation needs - medical: Not on file  . Transportation needs - non-medical: Not on file  Occupational History  . Not on file  Tobacco Use  . Smoking status: Never Smoker  . Smokeless tobacco: Never Used  Substance and Sexual Activity  . Alcohol use: Yes  . Drug use: No  . Sexual activity: Not on file  Other Topics Concern  . Not on file  Social History Narrative  . Not on file     Review of Systems: General: negative for chills, fever, night sweats or weight changes.  Cardiovascular: negative for chest pain, dyspnea on exertion, edema, orthopnea, palpitations, paroxysmal nocturnal dyspnea or shortness of breath Dermatological: negative for rash Respiratory: negative for cough or wheezing Urologic: negative for hematuria Abdominal: negative for nausea, vomiting, diarrhea, bright red blood per rectum, melena, or hematemesis Neurologic: negative for visual changes, syncope, or dizziness All other systems reviewed and  are otherwise negative except as noted above.    Blood pressure (!) 156/110, pulse (!) 104, height 5' (1.524 m), weight 181 lb (82.1 kg).  General appearance: alert and no distress Neck: no adenopathy, no carotid bruit, no JVD, supple, symmetrical, trachea midline and thyroid not enlarged, symmetric, no tenderness/mass/nodules Lungs: clear to auscultation bilaterally Heart: regular rate and rhythm, S1, S2 normal, no murmur, click, rub or gallop Extremities: extremities normal,  atraumatic, no cyanosis or edema Pulses: 2+ and symmetric Skin: Skin color, texture, turgor normal. No rashes or lesions Neurologic: Alert and oriented X 3, normal strength and tone. Normal symmetric reflexes. Normal coordination and gait  EKG sinus tachycardia 104 with poor R-wave progression and left axis deviation/left anterior fascicular block. I personally reviewed this EKG.  ASSESSMENT AND PLAN:   Dyslipidemia History of hyperlipidemia on high-dose statin therapy. We will recheck a lipid and liver profile  Essential hypertension History of essential hypertension blood pressure measured 156/110. She is on metoprolol and losartan. Increase her second from 50-100 mg a day and her metoprolol from 75 monos twice a day to 100 mg by mouth twice a day. She'll keep a daily blood pressure log is a Kristin back in the office in 2-3 weeks for follow-up.  S/P CABG x 4 History of CAD status post non-STEMI 07/29/17 catheterization that day performed by Dr. Martinique revealing three-vessel disease with normal LV function. She underwent coronary bypass grafting 4 the following day by Dr. Roxan Hockey with a RIMA to the PDA, vein graft to the second diagonal branch sequential radial to OM 1 and 2. Her postoperative course was uncomplicated and she was discharged home on 08/06/17. She has been participating in cardiac rehabilitation program who are her blood pressure has been somewhat difficult to control intermittently been titrated.      Lorretta Harp MD FACP,FACC,FAHA, [facility] 09/17/2017 3:52 PM

## 2017-09-17 NOTE — Progress Notes (Signed)
Cardiac Individual Treatment Plan  Patient Details  Name: Nadeen Landauara J Ocanas MRN: [id number] Date of Birth: [birth year] Referring Provider:     CARDIAC REHAB PHASE II [facility]  Referring Provider  Nanetta BattyBerry, [name], MD      Initial Encounter Date:    CARDIAC REHAB PHASE [facility]  Date  09/02/17  Referring Provider  Nanetta BattyBerry, [name], MD      Visit Diagnosis: NSTEMI (non-ST elevated myocardial infarction) (HCC) 07/28/17  S/P CABG x 410/12/18  Patient's Home Medications on Admission:  Current Outpatient Medications:  .  aspirin 325 MG EC tablet, TAKE 1 TABLET BY MOUTH EVERY DAY, Disp: 30 tablet, Rfl: 6 .  atorvastatin (LIPITOR) 40 MG tablet, Take 1 tablet (40 mg total) by mouth daily., Disp: 90 tablet, Rfl: 3 .  glyBURIDE (DIABETA) 5 MG tablet, Take 5 mg by mouth daily., Disp: , Rfl:  .  losartan (COZAAR) 100 MG tablet, Take 1 tablet (100 mg total) by mouth daily., Disp: 30 tablet, Rfl: 6 .  metFORMIN (GLUCOPHAGE) 500 MG tablet, Take 1,000 mg by mouth 2 (two) times daily. , Disp: , Rfl:  .  metoprolol succinate (TOPROL-XL) 100 MG 24 hr tablet, Take 1 tablet (100 mg total) by mouth 2 (two) times daily. Take with or immediately following a meal., Disp: 60 tablet, Rfl: 6 .  polyethylene glycol (MIRALAX / GLYCOLAX) packet, Take 17 g by mouth daily., Disp: 14 each, Rfl: 0  Past Medical History: Past Medical History:  Diagnosis Date  . Coronary artery disease   . Diabetes mellitus without complication (HCC)   . Hyperlipidemia   . Hypertension   . Obesity     Tobacco Use: Social History   Tobacco Use  Smoking Status Never Smoker  Smokeless Tobacco Never Used    Labs: Recent Review Flowsheet Data    Labs for ITP Cardiac and Pulmonary Rehab Latest Ref Rng & Units 07/30/2017 07/30/2017 07/30/2017 07/30/2017 07/31/2017   Cholestrol 0 - 200 mg/dL - - - -  -   LDLCALC 0 - 99 mg/dL - - - - -   HDL >40>40 mg/dL - - - - -   Trlycerides <150 mg/dL - - - - -   Hemoglobin A1c 4.8 - 5.6 % - - - - -   PHART 7.350 - 7.450 7.446 - 7.475(H) 7.458(H) -   PCO2ART 32.0 - 48.0 mmHg 35.8 - 38.9 38.4 -   HCO3 20.0 - 28.0 mmol/L 24.7 - 28.4(H) 26.9 -   TCO2 22 - 32 mmol/L 26 24 30 28 24    ACIDBASEDEF 0.0 - 2.0 mmol/L - - - - -   O2SAT % 94.0 - 96.0 90.0 -      Capillary Blood Glucose: Lab Results  Component Value Date   GLUCAP 135 (H) 09/08/2017   GLUCAP 135 (H) 09/06/2017   GLUCAP 185 (H) 09/06/2017   GLUCAP 96 08/05/2017   GLUCAP 46 (L) 08/05/2017     Exercise Target Goals:    Exercise Program Goal: Individual exercise prescription set with THRR, safety & activity barriers. Participant demonstrates ability to understand and report RPE using BORG scale, to self-measure pulse accurately, and to acknowledge the importance of the exercise prescription.  Exercise Prescription Goal: Starting with aerobic activity 30 plus minutes a day, 3 days per week for initial exercise prescription. Provide home exercise prescription and guidelines that participant acknowledges understanding prior to discharge.  Activity  Barriers & Risk Stratification: Activity Barriers & Cardiac Risk Stratification - 09/02/17 1057      Activity Barriers & Cardiac Risk Stratification   Activity Barriers  Deconditioning;Muscular Weakness;Incisional Pain       6 Minute Walk: 6 Minute Walk    Row Name 09/02/17 1056         6 Minute Walk   Phase  Initial     Distance  1158 feet     Walk Time  6 minutes     # of Rest Breaks  0     MPH  2.19     METS  3.54     RPE  13     VO2 Peak  12.39     Symptoms  No     Resting HR  100 bpm     Resting BP  124/78     Resting Oxygen Saturation   97 %     Exercise Oxygen Saturation  during 6 min walk  98 %     Max Ex. HR  126 bpm     Max Ex. BP  144/92     2 Minute Post BP  130/80        Oxygen Initial Assessment:   Oxygen  Re-Evaluation:   Oxygen Discharge (Final Oxygen Re-Evaluation):   Initial Exercise Prescription: Initial Exercise Prescription - 09/02/17 1000      Date of Initial Exercise RX and Referring Provider   Date  09/02/17    Referring Provider  Nanetta BattyBerry, [name], MD      Recumbant Bike   Level  2    Minutes  10    METs  2.5      NuStep   Level  3    SPM  80    Minutes  10    METs  2.5      Track   Laps  9    Minutes  10    METs  2.57      Prescription Details   Frequency (times per week)  3    Duration  Progress to 30 minutes of continuous aerobic without signs/symptoms of physical distress      Intensity   THRR 40-80% of Max Heartrate  68-135    Ratings of Perceived Exertion  11-13    Perceived Dyspnea  0-4      Progression   Progression  Continue to progress workloads to maintain intensity without signs/symptoms of physical distress.      Resistance Training   Training Prescription  Yes    Weight  2lbs    Reps  10-15       Perform Capillary Blood Glucose checks as needed.  Exercise Prescription Changes: Exercise Prescription Changes    Row Name 09/06/17 [phone]             Response to Exercise   Blood Pressure (Admit)  142/84       Blood Pressure (Exercise)  142/70       Blood Pressure (Exit)  122/90       Heart Rate (Admit)  96 bpm       Heart Rate (Exercise)  122 bpm       Heart Rate (Exit)  96 bpm       Rating of Perceived Exertion (Exercise)  16       Symptoms  none       Duration  Progress to 30 minutes of  aerobic without signs/symptoms of physical distress  Intensity  THRR unchanged         Progression   Progression  Continue to progress workloads to maintain intensity without signs/symptoms of physical distress.       Average METs  2         Resistance Training   Training Prescription  No         Interval Training   Interval Training  No         Recumbant Bike   Level  2       Minutes  10         NuStep   Level  3       SPM  80        Minutes  10       METs  2.2         Track   Laps  4       Minutes  8       METs  1.87         Home Exercise Plan   Plans to continue exercise at  Home (comment)       Frequency  Add 3 additional days to program exercise sessions.          Exercise Comments: Exercise Comments    Row Name 09/06/17 1355           Exercise Comments  Off to a good start with exercise.          Exercise Goals and Review: Exercise Goals    Row Name 09/02/17 [phone]             Exercise Goals   Increase Physical Activity  Yes       Intervention  Provide advice, education, support and counseling about physical activity/exercise needs.;Develop an individualized exercise prescription for aerobic and resistive training based on initial evaluation findings, risk stratification, comorbidities and participant's personal goals.       Expected Outcomes  Achievement of increased cardiorespiratory fitness and enhanced flexibility, muscular endurance and strength shown through measurements of functional capacity and personal statement of participant.       Increase Strength and Stamina  Yes       Intervention  Provide advice, education, support and counseling about physical activity/exercise needs.;Develop an individualized exercise prescription for aerobic and resistive training based on initial evaluation findings, risk stratification, comorbidities and participant's personal goals.       Expected Outcomes  Achievement of increased cardiorespiratory fitness and enhanced flexibility, muscular endurance and strength shown through measurements of functional capacity and personal statement of participant.       Able to understand and use rate of perceived exertion (RPE) scale  Yes       Intervention  Provide education and explanation on how to use RPE scale       Expected Outcomes  Short Term: Able to use RPE daily in rehab to express subjective intensity level;Long Term:  Able to use RPE to guide intensity  level when exercising independently       Knowledge and understanding of Target Heart Rate Range (THRR)  Yes       Intervention  Provide education and explanation of THRR including how the numbers were predicted and where they are located for reference       Expected Outcomes  Short Term: Able to state/look up THRR;Long Term: Able to use THRR to govern intensity when exercising independently;Short Term: Able to use daily as guideline for intensity in rehab  Able to check pulse independently  Yes       Intervention  Provide education and demonstration on how to check pulse in carotid and radial arteries.;Review the importance of being able to check your own pulse for safety during independent exercise       Expected Outcomes  Short Term: Able to explain why pulse checking is important during independent exercise;Long Term: Able to check pulse independently and accurately       Understanding of Exercise Prescription  Yes       Intervention  Provide education, explanation, and written materials on patient's individual exercise prescription       Expected Outcomes  Short Term: Able to explain program exercise prescription;Long Term: Able to explain home exercise prescription to exercise independently          Exercise Goals Re-Evaluation : Exercise Goals Re-Evaluation    Row Name 09/06/17 1355             Exercise Goal Re-Evaluation   Exercise Goals Review  Increase Physical Activity       Comments  Patient tolerated low intesnity exercise without c/o today at cardiac rehab. Pt states she's walking 20-30 minutes, 3 days/week.       Expected Outcomes  Patient will continue exercise 5-6 days per week progressing to 30 minutes.           Discharge Exercise Prescription (Final Exercise Prescription Changes): Exercise Prescription Changes - 09/06/17 0948      Response to Exercise   Blood Pressure (Admit)  142/84    Blood Pressure (Exercise)  142/70    Blood Pressure (Exit)  122/90     Heart Rate (Admit)  96 bpm    Heart Rate (Exercise)  122 bpm    Heart Rate (Exit)  96 bpm    Rating of Perceived Exertion (Exercise)  16    Symptoms  none    Duration  Progress to 30 minutes of  aerobic without signs/symptoms of physical distress    Intensity  THRR unchanged      Progression   Progression  Continue to progress workloads to maintain intensity without signs/symptoms of physical distress.    Average METs  2      Resistance Training   Training Prescription  No      Interval Training   Interval Training  No      Recumbant Bike   Level  2    Minutes  10      NuStep   Level  3    SPM  80    Minutes  10    METs  2.2      Track   Laps  4    Minutes  8    METs  1.87      Home Exercise Plan   Plans to continue exercise at  Home (comment)    Frequency  Add 3 additional days to program exercise sessions.       Nutrition:  Target Goals: Understanding of nutrition guidelines, daily intake of sodium 1500mg , cholesterol 200mg , calories 30% from fat and 7% or less from saturated fats, daily to have 5 or more servings of fruits and vegetables.  Biometrics: Pre Biometrics - 09/02/17 1221      Pre Biometrics   Height  5' (1.524 m)    Weight  182 lb 12.2 oz (82.9 kg)    Waist Circumference  37.5 inches    Hip Circumference  44 inches    Waist  to Hip Ratio  0.85 %    BMI (Calculated)  35.69    Triceps Skinfold  41 mm    % Body Fat  45.8 %    Grip Strength  18 kg    Flexibility  10 in    Single Leg Stand  30 seconds        Nutrition Therapy Plan and Nutrition Goals: Nutrition Therapy & Goals - 09/02/17 1052      Nutrition Therapy   Diet  Carb Modified, Mediterranean      Personal Nutrition Goals   Nutrition Goal  Pt to identify food quantities necessary to achieve weight loss of 6-24 lb at graduation from cardiac rehab. Long-term wt loss goal wt of 140 lb desired.     Personal Goal #2  CBG concentrations in the normal range or as close to normal as is  safely possible.      Intervention Plan   Intervention  Prescribe, educate and counsel regarding individualized specific dietary modifications aiming towards targeted core components such as weight, hypertension, lipid management, diabetes, heart failure and other comorbidities.    Expected Outcomes  Short Term Goal: Understand basic principles of dietary content, such as calories, fat, sodium, cholesterol and nutrients.;Long Term Goal: Adherence to prescribed nutrition plan.       Nutrition Discharge: Nutrition Scores:   Nutrition Goals Re-Evaluation:   Nutrition Goals Re-Evaluation:   Nutrition Goals Discharge (Final Nutrition Goals Re-Evaluation):   Psychosocial: Target Goals: Acknowledge presence or absence of significant depression and/or stress, maximize coping skills, provide positive support system. Participant is able to verbalize types and ability to use techniques and skills needed for reducing stress and depression.  Initial Review & Psychosocial Screening: Initial Psych Review & Screening - 09/02/17 1216      Initial Review   Current issues with  Current Stress Concerns    Source of Stress Concerns  Chronic Illness;Family    Comments  Patient says she is having difficulty swallowing      Family Dynamics   Good Support System?  Yes      Barriers   Psychosocial barriers to participate in program  The patient should benefit from training in stress management and relaxation.      Screening Interventions   Interventions  Encouraged to exercise;To provide support and resources with identified psychosocial needs;Provide feedback about the scores to participant       Quality of Life Scores: Quality of Life - 09/06/17 1355      Quality of Life Scores   Health/Function Pre  19.5 %    Socioeconomic Pre  25.71 %    Psych/Spiritual Pre  22.07 %    Family Pre  20.7 %    GLOBAL Pre  21.49 %       PHQ-9: Recent Review Flowsheet Data    Depression screen [facility] 2/9  09/06/2017 01/10/2014   Decreased Interest 0 0   Down, Depressed, Hopeless 0 0   PHQ - 2 Score 0 0     Interpretation of Total Score  Total Score Depression Severity:  1-4 = Minimal depression, 5-9 = Mild depression, 10-14 = Moderate depression, 15-19 = Moderately severe depression, 20-27 = Severe depression   Psychosocial Evaluation and Intervention:   Psychosocial Re-Evaluation:   Psychosocial Discharge (Final Psychosocial Re-Evaluation):   Vocational Rehabilitation: Provide vocational rehab assistance to qualifying candidates.   Vocational Rehab Evaluation & Intervention: Vocational Rehab - 09/06/17 4098      Initial Vocational Rehab Evaluation & Intervention  Assessment shows need for Vocational Rehabilitation  No       Education: Education Goals: Education classes will be provided on a weekly basis, covering required topics. Participant will state understanding/return demonstration of topics presented.  Learning Barriers/Preferences: Learning Barriers/Preferences - 09/02/17 1610      Learning Barriers/Preferences   Learning Barriers  Sight glasses    Learning Preferences  Skilled Demonstration;Written Material;Video;Pictoral       Education Topics: Count Your Pulse:  -Group instruction provided by verbal instruction, demonstration, patient participation and written materials to support subject.  Instructors address importance of being able to find your pulse and how to count your pulse when at home without a heart monitor.  Patients get hands on experience counting their pulse with staff help and individually.   Heart Attack, Angina, and Risk Factor Modification:  -Group instruction provided by verbal instruction, video, and written materials to support subject.  Instructors address signs and symptoms of angina and heart attacks.    Also discuss risk factors for heart disease and how to make changes to improve heart health risk factors.   Functional Fitness:   -Group instruction provided by verbal instruction, demonstration, patient participation, and written materials to support subject.  Instructors address safety measures for doing things around the house.  Discuss how to get up and down off the floor, how to pick things up properly, how to safely get out of a chair without assistance, and balance training.   Meditation and Mindfulness:  -Group instruction provided by verbal instruction, patient participation, and written materials to support subject.  Instructor addresses importance of mindfulness and meditation practice to help reduce stress and improve awareness.  Instructor also leads participants through a meditation exercise.    Stretching for Flexibility and Mobility:  -Group instruction provided by verbal instruction, patient participation, and written materials to support subject.  Instructors lead participants through series of stretches that are designed to increase flexibility thus improving mobility.  These stretches are additional exercise for major muscle groups that are typically performed during regular warm up and cool down.   Hands Only CPR:  -Group verbal, video, and participation provides a basic overview of AHA guidelines for community CPR. Role-play of emergencies allow participants the opportunity to practice calling for help and chest compression technique with discussion of AED use.   Hypertension: -Group verbal and written instruction that provides a basic overview of hypertension including the most recent diagnostic guidelines, risk factor reduction with self-care instructions and medication management.    Nutrition I class: Heart Healthy Eating:  -Group instruction provided by PowerPoint slides, verbal discussion, and written materials to support subject matter. The instructor gives an explanation and review of the Therapeutic Lifestyle Changes diet recommendations, which includes a discussion on lipid goals, dietary  fat, sodium, fiber, plant stanol/sterol esters, sugar, and the components of a well-balanced, healthy diet.   Nutrition II class: Lifestyle Skills:  -Group instruction provided by PowerPoint slides, verbal discussion, and written materials to support subject matter. The instructor gives an explanation and review of label reading, grocery shopping for heart health, heart healthy recipe modifications, and ways to make healthier choices when eating out.   Diabetes Question & Answer:  -Group instruction provided by PowerPoint slides, verbal discussion, and written materials to support subject matter. The instructor gives an explanation and review of diabetes co-morbidities, pre- and post-prandial blood glucose goals, pre-exercise blood glucose goals, signs, symptoms, and treatment of hypoglycemia and hyperglycemia, and foot care basics.   Diabetes Blitz:  -  Group instruction provided by PowerPoint slides, verbal discussion, and written materials to support subject matter. The instructor gives an explanation and review of the physiology behind type 1 and type 2 diabetes, diabetes medications and rational behind using different medications, pre- and post-prandial blood glucose recommendations and Hemoglobin A1c goals, diabetes diet, and exercise including blood glucose guidelines for exercising safely.    Portion Distortion:  -Group instruction provided by PowerPoint slides, verbal discussion, written materials, and food models to support subject matter. The instructor gives an explanation of serving size versus portion size, changes in portions sizes over the last 20 years, and what consists of a serving from each food group.   Stress Management:  -Group instruction provided by verbal instruction, video, and written materials to support subject matter.  Instructors review role of stress in heart disease and how to cope with stress positively.     Exercising on Your Own:  -Group instruction provided  by verbal instruction, power point, and written materials to support subject.  Instructors discuss benefits of exercise, components of exercise, frequency and intensity of exercise, and end points for exercise.  Also discuss use of nitroglycerin and activating EMS.  Review options of places to exercise outside of rehab.  Review guidelines for sex with heart disease.   Cardiac Drugs I:  -Group instruction provided by verbal instruction and written materials to support subject.  Instructor reviews cardiac drug classes: antiplatelets, anticoagulants, beta blockers, and statins.  Instructor discusses reasons, side effects, and lifestyle considerations for each drug class.   Cardiac Drugs II:  -Group instruction provided by verbal instruction and written materials to support subject.  Instructor reviews cardiac drug classes: angiotensin converting enzyme inhibitors (ACE-I), angiotensin II receptor blockers (ARBs), nitrates, and calcium channel blockers.  Instructor discusses reasons, side effects, and lifestyle considerations for each drug class.   Anatomy and Physiology of the Circulatory System:  Group verbal and written instruction and models provide basic cardiac anatomy and physiology, with the coronary electrical and arterial systems. Review of: AMI, Angina, Valve disease, Heart Failure, Peripheral Artery Disease, Cardiac Arrhythmia, Pacemakers, and the ICD.   Other Education:  -Group or individual verbal, written, or video instructions that support the educational goals of the cardiac rehab program.   Knowledge Questionnaire Score: Knowledge Questionnaire Score - 09/06/17 1354      Knowledge Questionnaire Score   Pre Score  18/24       Core Components/Risk Factors/Patient Goals at Admission: Personal Goals and Risk Factors at Admission - 09/02/17 1104      Core Components/Risk Factors/Patient Goals on Admission    Weight Management  Yes;Obesity;Weight Maintenance;Weight Loss     Intervention  Weight Management: Develop a combined nutrition and exercise program designed to reach desired caloric intake, while maintaining appropriate intake of nutrient and fiber, sodium and fats, and appropriate energy expenditure required for the weight goal.;Weight Management: Provide education and appropriate resources to help participant work on and attain dietary goals.;Weight Management/Obesity: Establish reasonable short term and long term weight goals.    Admit Weight  182 lb 12.2 oz (82.9 kg)    Goal Weight: Short Term  160 lb (72.6 kg)    Goal Weight: Long Term  140 lb (63.5 kg)    Expected Outcomes  Short Term: Continue to assess and modify interventions until short term weight is achieved;Long Term: Adherence to nutrition and physical activity/exercise program aimed toward attainment of established weight goal;Weight Maintenance: Understanding of the daily nutrition guidelines, which includes 25-35% calories from  fat, 7% or less cal from saturated fats, less than 200mg  cholesterol, less than 1.5gm of sodium, & 5 or more servings of fruits and vegetables daily;Weight Loss: Understanding of general recommendations for a balanced deficit meal plan, which promotes 1-2 lb weight loss per week and includes a negative energy balance of [phone] kcal/d;Understanding of distribution of calorie intake throughout the day with the consumption of 4-5 meals/snacks;Understanding recommendations for meals to include 15-35% energy as protein, 25-35% energy from fat, 35-60% energy from carbohydrates, less than 200mg  of dietary cholesterol, 20-35 gm of total fiber daily    Diabetes  Yes    Intervention  Provide education about signs/symptoms and action to take for hypo/hyperglycemia.;Provide education about proper nutrition, including hydration, and aerobic/resistive exercise prescription along with prescribed medications to achieve blood glucose in normal ranges: Fasting glucose 65-99 mg/dL    Expected  Outcomes  Short Term: Participant verbalizes understanding of the signs/symptoms and immediate care of hyper/hypoglycemia, proper foot care and importance of medication, aerobic/resistive exercise and nutrition plan for blood glucose control.;Long Term: Attainment of HbA1C < 7%.    Hypertension  Yes    Intervention  Monitor prescription use compliance.;Provide education on lifestyle modifcations including regular physical activity/exercise, weight management, moderate sodium restriction and increased consumption of fresh fruit, vegetables, and low fat dairy, alcohol moderation, and smoking cessation.    Expected Outcomes  Short Term: Continued assessment and intervention until BP is < 140/56mm HG in hypertensive participants. < 130/41mm HG in hypertensive participants with diabetes, heart failure or chronic kidney disease.;Long Term: Maintenance of blood pressure at goal levels.    Lipids  Yes    Intervention  Provide education and support for participant on nutrition & aerobic/resistive exercise along with prescribed medications to achieve LDL 70mg , HDL >40mg .    Expected Outcomes  Short Term: Participant states understanding of desired cholesterol values and is compliant with medications prescribed. Participant is following exercise prescription and nutrition guidelines.;Long Term: Cholesterol controlled with medications as prescribed, with individualized exercise RX and with personalized nutrition plan. Value goals: LDL < 70mg , HDL > 40 mg.    Stress  Yes    Intervention  Offer individual and/or small group education and counseling on adjustment to heart disease, stress management and health-related lifestyle change. Teach and support self-help strategies.;Refer participants experiencing significant psychosocial distress to appropriate mental health specialists for further evaluation and treatment. When possible, include family members and significant others in education/counseling sessions.    Expected  Outcomes  Short Term: Participant demonstrates changes in health-related behavior, relaxation and other stress management skills, ability to obtain effective social support, and compliance with psychotropic medications if prescribed.;Long Term: Emotional wellbeing is indicated by absence of clinically significant psychosocial distress or social isolation.       Core Components/Risk Factors/Patient Goals Review:    Core Components/Risk Factors/Patient Goals at Discharge (Final Review):    ITP Comments: ITP Comments    Row Name 09/02/17 0849 09/17/17 1622         ITP Comments  Dr. Armanda Magic, Medical Director  30 Day ITP review, Astrid's exercise is on hold until her blood pressures are better controlled         Comments: See ITP comments.[place] Lighter, RN,BSN 09/17/2017 4:24 PM

## 2017-09-17 NOTE — Patient Instructions (Signed)
Medication Instructions: Your physician recommends that you continue on your current medications as directed. Please refer to the Current Medication list given to you today.  Increase Losartan to 100 mg daily.  Increase Metoprolol to 100 mg twice daily.  Your physician has requested that you regularly monitor and record your blood pressure readings at home. Please use the same machine at the same time of day to check your readings and record them to bring to your follow-up visit.  Labwork: Your physician recommends that you return for a FASTING lipid profile and hepatic function panel in January 2019.   Follow-Up: Your physician recommends that you schedule a follow-up appointment in: 2 weeks with PharmD in [facility] Clinic.  We request that you follow-up in: 2 months with Corine ShelterLuke Kilroy, PA and in 6 months with Dr San MorelleBerry  You will receive a reminder letter in the mail two months in advance. If you don't receive a letter, please call our office to schedule the follow-up appointment.  If you need a refill on your cardiac medications before your next appointment, please call your pharmacy.

## 2017-09-17 NOTE — Assessment & Plan Note (Signed)
History of CAD status post non-STEMI 07/29/17 catheterization that day performed by Dr. SwazilandJordan revealing three-vessel disease with normal LV function. She underwent coronary bypass grafting 4 the following day by Dr. Dorris FetchHendrickson with a RIMA to the PDA, vein graft to the second diagonal branch sequential radial to OM 1 and 2. Her postoperative course was uncomplicated and she was discharged home on 08/06/17. She has been participating in cardiac rehabilitation program who are her blood pressure has been somewhat difficult to control intermittently been titrated.

## 2017-09-17 NOTE — Assessment & Plan Note (Signed)
History of hyperlipidemia on high-dose statin therapy.  We will recheck a lipid and liver profile. 

## 2017-09-17 NOTE — Assessment & Plan Note (Signed)
History of essential hypertension blood pressure measured 156/110. She is on metoprolol and losartan. Increase her second from 50-100 mg a day and her metoprolol from 75 monos twice a day to 100 mg by mouth twice a day. She'll keep a daily blood pressure log is a Kristin back in the office in 2-3 weeks for follow-up.

## 2017-09-20 ENCOUNTER — Encounter (HOSPITAL_COMMUNITY): Payer: 59

## 2017-09-20 ENCOUNTER — Telehealth: Payer: Self-pay | Admitting: Cardiovascular Disease

## 2017-09-20 NOTE — Telephone Encounter (Signed)
Not correct, should be tartrate

## 2017-09-20 NOTE — Telephone Encounter (Signed)
Spoke with [facility]. Patient was previously on metoprolol tartrate 75mg  BID and saw MD on 11/30. Med dose was increased to 100mg  BID but med was changed from metoprolol tartrate to metoprolol succinate. Pharmacy would like to confirm that change from tartrate to succinate was correct, in addition to change in dose.   Will have MD review as note does not specify

## 2017-09-21 MED ORDER — METOPROLOL TARTRATE 100 MG PO TABS: 100.0000 mg | ORAL_TABLET | Freq: Two times a day (BID) | ORAL | 1 refills | Status: DC

## 2017-09-21 NOTE — Telephone Encounter (Signed)
Called pharmacy, pt supposed to be on Metoprolol tartrate 100 mg bid. Sent in new Rx.

## 2017-09-22 ENCOUNTER — Encounter (HOSPITAL_COMMUNITY): Payer: 59

## 2017-09-22 ENCOUNTER — Telehealth (HOSPITAL_COMMUNITY): Payer: Self-pay | Admitting: *Deleted

## 2017-09-22 ENCOUNTER — Telehealth: Payer: Self-pay | Admitting: Cardiovascular Disease

## 2017-09-22 NOTE — Telephone Encounter (Signed)
Spoke with pt and [name] [name], the patients bp medications were changed at her last visit with dr berry. She has been unable to exercise at rehab because her bp is still high. 152/104 pulse 84 currently at rehab. They did check her cuff and she needs a larger cuff for appropriate readings. The patient questioned the metoprolol that was called into the pharmcy as the same as what was previously on. Names for lopressor and succinate given to the patient to check her bottle when she gets home. Patient to call back to confirm exactly what kind of metoprolol she has at home. Follow up appointment made in the hypertension as per dr berry's last office note. Pt agreed with this plan.

## 2017-09-23 ENCOUNTER — Ambulatory Visit: Payer: 59 | Admitting: Pharmacist

## 2017-09-23 ENCOUNTER — Encounter: Payer: Self-pay | Admitting: Pharmacist

## 2017-09-23 VITALS — BP 180/110 | HR 82 | Wt 180.4 lb

## 2017-09-23 DIAGNOSIS — I10 Essential (primary) hypertension: Secondary | ICD-10-CM

## 2017-09-23 MED ORDER — VALSARTAN 80 MG PO TABS: 80.0000 mg | ORAL_TABLET | Freq: Two times a day (BID) | ORAL | 3 refills | Status: DC

## 2017-09-23 NOTE — Progress Notes (Signed)
Patient ID: [name] [name]                 DOB: 12/30/1965                      MRN: [id number]     HPI: [name] [name] is a 51 y.o. female referred by Dr. Allyson SabalBerry to HTN clinic. PMH includes CAD s/p CABG x 4, hypertension, and hyperlipidemia.  Patient reports problems sleeping, some anxiety at home but no significant changes. Losartan dose was increased from 50mg  to 100mg  daily by DR Allyson SabalBerry on 09/17/2017. Metoprolol dose was also increased from 50mg  to 100mg  during same OV. Patient denies headaches, dizziness, chest pain, or swelling. Also reports problems with severe constipation, taking docusate 100mg  daily OTC. Patient used to take valsartan 160mg  with better BP control but changed to losartan during valsartan recall few months ago.  Current HTN meds:  Losartan 100mg  daily Metoprolol tartrate 100mg  twice dialy  BP goal: 130/80  Family History: high cholesterol and HTN from father, high cholesterol from mother  Social History: denies alcohol intake, or tobacco use  Diet: low sodium , low carbohydrates, coffee twice per week  Exercise: cardiac rehab  Home BP readings: BP home device was determined to be inaccurate by cardiac rehab nurse  Wt Readings from Last 3 Encounters:  09/23/17 180 lb 6.4 oz (81.8 kg)  09/17/17 181 lb (82.1 kg)  09/02/17 182 lb 12.2 oz (82.9 kg)   BP Readings from Last 3 Encounters:  09/23/17 (!) 180/110  09/17/17 (!) 156/110  09/08/17 (!) 176/114   Pulse Readings from Last 3 Encounters:  09/23/17 82  09/17/17 (!) 104  09/08/17 (!) 101    Past Medical History:  Diagnosis Date  . Coronary artery disease   . Diabetes mellitus without complication (HCC)   . Hyperlipidemia   . Hypertension   . Obesity     Current Outpatient Medications on File Prior to Visit  Medication Sig Dispense Refill  . aspirin 325 MG EC tablet TAKE 1 TABLET BY MOUTH EVERY DAY 30 tablet 6  . atorvastatin (LIPITOR) 40 MG tablet Take 1 tablet (40 mg total) by mouth daily. 90  tablet 3  . glyBURIDE (DIABETA) 5 MG tablet Take 5 mg by mouth daily.    . metFORMIN (GLUCOPHAGE) 500 MG tablet Take 1,000 mg by mouth 2 (two) times daily.     . metoprolol tartrate (LOPRESSOR) 100 MG tablet Take 1 tablet (100 mg total) by mouth 2 (two) times daily. 180 tablet 1  . polyethylene glycol (MIRALAX / GLYCOLAX) packet Take 17 g by mouth daily. 14 each 0   No current facility-administered medications on file prior to visit.     Allergies  Allergen Reactions  . Penicillins Hives    Blood pressure (!) 180/110, pulse 82, weight 180 lb 6.4 oz (81.8 kg).  Essential hypertension Patient BP remains significantly above goal of 130/80 and not improved since losartan dose was increased from 50mg  to 100mg .  Patient denies recent  changes in diet or health. Noted her BP used to be better controlled on valsartan and amlodipine prior to her CABG surgery. Heart rate is better controlled today at 82bpm on metoprolol. Will change losartan 100mg  to valsartan 80mg  twice daily and continue metoprolol 100mg  twice daily. Patient is to monitor BP twice daily and bring record to f/u visit in 2 weeks. BMET repeat will be done during office visit in 2 weeks as well. Plan to change  metoprolol to carvedilol 25mg  twice daily during next office visit if additional BP control needed. May also consider adding amlodipine 5mg  daily to current regimen.  Rosan Calbert Rodriguez-Guzman PharmD, BCPS, CPP [facility] [address] [place] [phone] 09/23/2017 8:20 PM

## 2017-09-23 NOTE — Telephone Encounter (Signed)
Left message for pt to call.

## 2017-09-23 NOTE — Assessment & Plan Note (Addendum)
Patient BP remains significantly above goal of 130/80 and not improved since losartan dose was increased from 50mg  to 100mg .  Patient denies recent  changes in diet or health. Noted her BP used to be better controlled on valsartan and amlodipine prior to her CABG surgery. Heart rate is better controlled today at 82bpm on metoprolol. Will change losartan 100mg  to valsartan 80mg  twice daily and continue metoprolol 100mg  twice daily. Patient is to monitor BP twice daily and bring record to f/u visit in 2 weeks. BMET repeat will be done during office visit in 2 weeks as well. Plan to change metoprolol to carvedilol 25mg  twice daily during next office visit if additional BP control needed. May also consider adding amlodipine 5mg  daily to current regimen.

## 2017-09-23 NOTE — Telephone Encounter (Signed)
Spoke with pt, she has the correct medication and directions.

## 2017-09-23 NOTE — Patient Instructions (Addendum)
Return for a  follow up appointment in 2 weeks  Your blood pressure today is 180/110 pulse 82  Check your blood pressure at home daily (if able) and keep record of the readings.  Take your BP meds as follows: *STOP taking losartan* *START taking Valsartan 80mg  twice daily* *Continue taking metoprolol 100mg  twice daily*  Bring your BP cuff and your record of home blood pressures to your next appointment.  Exercise as you're able, try to walk approximately 30 minutes per day.  Keep salt intake to a minimum, especially watch canned and prepared boxed foods.  Eat more fresh fruits and vegetables and fewer canned items.  Avoid eating in fast food restaurants.    HOW TO TAKE YOUR BLOOD PRESSURE: . Rest 5 minutes before taking your blood pressure. .  Don't smoke or drink caffeinated beverages for at least 30 minutes before. . Take your blood pressure before (not after) you eat. . Sit comfortably with your back supported and both feet on the floor (don't cross your legs). . Elevate your arm to heart level on a table or a desk. . Use the proper sized cuff. It should fit smoothly and snugly around your bare upper arm. There should be enough room to slip a fingertip under the cuff. The bottom edge of the cuff should be 1 inch above the crease of the elbow. . Ideally, take 3 measurements at one sitting and record the average.

## 2017-09-24 ENCOUNTER — Encounter (HOSPITAL_COMMUNITY): Payer: 59

## 2017-09-27 ENCOUNTER — Encounter (HOSPITAL_COMMUNITY): Payer: 59

## 2017-09-29 ENCOUNTER — Encounter (HOSPITAL_COMMUNITY): Payer: 59

## 2017-09-30 ENCOUNTER — Other Ambulatory Visit: Payer: Self-pay | Admitting: Endocrinology

## 2017-10-01 ENCOUNTER — Encounter (HOSPITAL_COMMUNITY): Payer: 59

## 2017-10-01 ENCOUNTER — Other Ambulatory Visit (INDEPENDENT_AMBULATORY_CARE_PROVIDER_SITE_OTHER): Payer: 59

## 2017-10-01 LAB — RENAL FUNCTION PANEL
Albumin: 4.3 g/dL (ref 3.5–5.2)
BUN: 13 mg/dL (ref 6–23)
CHLORIDE: 102 meq/L (ref 96–112)
CO2: 30 meq/L (ref 19–32)
Calcium: 11.1 mg/dL — ABNORMAL HIGH (ref 8.4–10.5)
Creatinine, Ser: 0.81 mg/dL (ref 0.40–1.20)
GFR: 95.5 mL/min (ref >60.00)
Glucose, Bld: 83 mg/dL (ref 70–99)
PHOSPHORUS: 3.2 mg/dL (ref 2.3–4.6)
POTASSIUM: 3.8 meq/L (ref 3.5–5.1)
SODIUM: 140 meq/L (ref 135–145)

## 2017-10-01 LAB — VITAMIN D 25 HYDROXY (VIT D DEFICIENCY, FRACTURES): VITD: 12.55 ng/mL — ABNORMAL LOW (ref 30.00–100.00)

## 2017-10-04 ENCOUNTER — Encounter (HOSPITAL_COMMUNITY): Payer: 59

## 2017-10-05 ENCOUNTER — Encounter: Payer: Self-pay | Admitting: Endocrinology

## 2017-10-05 ENCOUNTER — Ambulatory Visit: Payer: 59 | Admitting: Endocrinology

## 2017-10-05 VITALS — BP 132/80 | HR 87 | Ht 60.0 in | Wt 178.0 lb

## 2017-10-05 DIAGNOSIS — E559 Vitamin D deficiency, unspecified: Secondary | ICD-10-CM

## 2017-10-05 DIAGNOSIS — E213 Hyperparathyroidism, unspecified: Secondary | ICD-10-CM | POA: Diagnosis not present

## 2017-10-05 NOTE — Patient Instructions (Signed)
Vitamin D 3, 5000 units daily

## 2017-10-05 NOTE — Progress Notes (Signed)
Patient ID: [name] [name], female   DOB: 18-Nov-1965, 51 y.o.   MRN: [id number]            Chief complaint: High calcium, follow-up   Referring physician: Dr. Nehemiah SettlePolite   History of Present Illness:  Referring physician:   Review of records show that she had a high calcium done in October 2017 The patient thinks that she has had higher calcium before also but no records are available Apparently at some point she was taking HCTZ and this was stopped with some improvement in calcium She says that she has been having hot flashes for up to 10 years, did have partial hysterectomy previously  Calcium level on 08/06/16 was 11.2, corrected calcium was 10.5, normal up to 10.3 PTH level from unknown lab 29 done in 10/17  Most recent calcium is 11.1, this was better during her hospitalization in October  Lab Results  Component Value Date   CALCIUM 11.1 (H) 10/01/2017   CALCIUM 9.3 08/03/2017   CALCIUM 9.1 08/02/2017   CALCIUM 9.2 08/01/2017   CALCIUM 9.4 07/31/2017   CALCIUM 10.0 07/30/2017   CALCIUM 9.9 07/29/2017   CALCIUM 10.8 (H) 07/28/2017   CALCIUM 10.6 (H) 10/05/2016    She has not been on any vitamin D supplements or calcium and also she does not drink any milk  Prior serologic and radiologic studies have included:  The patient thinks that in 2017 she had a bone density screening at her work and the result at the heel was reportedly normal No formal bone density done   Lab Results  Component Value Date   CALCIUM 11.1 (H) 10/01/2017   CAION 1.31 07/31/2017   PHOS 3.2 10/01/2017      25 (OH) Vitamin D level    Lab Results  Component Value Date   VD25OH 12.55 (L) 10/01/2017   VD25OH 14.58 (L) 10/05/2016        Allergies as of 10/05/2017      Reactions   Penicillins Hives      Medication List        Accurate as of 10/05/17  1:57 PM. Always use your most recent med list.          aspirin 325 MG EC tablet TAKE 1 TABLET BY MOUTH EVERY DAY     atorvastatin 40 MG tablet Commonly known as:  LIPITOR Take 1 tablet (40 mg total) by mouth daily.   glyBURIDE 5 MG tablet Commonly known as:  DIABETA Take 5 mg by mouth daily.   metFORMIN 500 MG tablet Commonly known as:  GLUCOPHAGE Take 1,000 mg by mouth 2 (two) times daily.   metoprolol tartrate 100 MG tablet Commonly known as:  LOPRESSOR Take 1 tablet (100 mg total) by mouth 2 (two) times daily.   polyethylene glycol packet Commonly known as:  MIRALAX / GLYCOLAX Take 17 g by mouth daily.   valsartan 80 MG tablet Commonly known as:  DIOVAN Take 1 tablet (80 mg total) by mouth 2 (two) times daily.       Allergies:  Allergies  Allergen Reactions  . Penicillins Hives    Past Medical History:  Diagnosis Date  . Coronary artery disease   . Diabetes mellitus without complication (HCC)   . Hyperlipidemia   . Hypertension   . Obesity     Past Surgical History:  Procedure Laterality Date  . CARDIAC CATHETERIZATION    . CESAREAN SECTION    . CORONARY ARTERY BYPASS GRAFT N/A 07/30/2017  Procedure: CORONARY ARTERY BYPASS GRAFTING (CABG)  x four, using right internal mammary artery, right greater saphenous vein, left radial artery;  Surgeon: Loreli SlotHendrickson, Steven C, MD;  [facility];  Service: Open Heart Surgery;  Laterality: N/A;  . INTRAOPERATIVE TRANSESOPHAGEAL ECHOCARDIOGRAM N/A 07/30/2017   Procedure: INTRAOPERATIVE TRANSESOPHAGEAL ECHOCARDIOGRAM;  Surgeon: Loreli SlotHendrickson, Steven C, MD;  [facility];  Service: Open Heart Surgery;  Laterality: N/A;  . LEFT HEART CATH AND CORONARY ANGIOGRAPHY N/A 07/29/2017   Procedure: LEFT HEART CATH AND CORONARY ANGIOGRAPHY;  Surgeon: SwazilandJordan, Peter M, MD;  [facility];  Service: Cardiovascular;  Laterality: N/A;  . PARTIAL HYSTERECTOMY  2005  . RADIAL ARTERY HARVEST Left 07/30/2017   Procedure: RADIAL ARTERY HARVEST;  Surgeon: Loreli SlotHendrickson, Steven C, MD;  [facility];  Service: Open Heart Surgery;   Laterality: Left;    Family History  Problem Relation Age of Onset  . Hypertension Other     Social History:  reports that  has never smoked. she has never used smokeless tobacco. She reports that she drinks alcohol. She reports that she does not use drugs.  Review of Systems   EXAM:  BP 132/80   Pulse 87   Ht 5' (1.524 m)   Wt 178 lb (80.7 kg)   SpO2 96%   BMI 34.76 kg/m   Exam not indicated today  Assessment/Plan:   HYPERCALCEMIA:  Patient has had mild hypercalcemia for unknown duration of time Her level has been mildly increased with the highest level about 11.2 last year and now 11.1 on the fluctuating PTH 29 on the last assessment  She has not had any formal bone density studies and since she is likely to be possible for several years will need to evaluate this May need specific intervention if she has osteoporosis  VITAMIN D deficiency: Currently not on any treatment Since she is significantly deficient she will start 5000 units of vitamin D3 daily  Follow-up in 6 months   Gilmore List 10/05/2017, 1:57 PM     Note: This office note was prepared with Dragon voice recognition system technology. Any transcriptional errors that result from this process are unintentional.

## 2017-10-06 ENCOUNTER — Encounter (HOSPITAL_COMMUNITY): Payer: 59

## 2017-10-06 ENCOUNTER — Ambulatory Visit (INDEPENDENT_AMBULATORY_CARE_PROVIDER_SITE_OTHER)
Admission: RE | Admit: 2017-10-06 | Discharge: 2017-10-06 | Disposition: A | Discharge status: Home / Self Care | Payer: 59 | Source: Ambulatory Visit | Attending: Endocrinology | Admitting: Endocrinology

## 2017-10-06 DIAGNOSIS — E213 Hyperparathyroidism, unspecified: Secondary | ICD-10-CM | POA: Diagnosis not present

## 2017-10-07 ENCOUNTER — Ambulatory Visit: Payer: 59 | Admitting: Pharmacist

## 2017-10-07 ENCOUNTER — Encounter: Payer: Self-pay | Admitting: Pharmacist

## 2017-10-07 VITALS — BP 174/100 | HR 78

## 2017-10-07 DIAGNOSIS — I10 Essential (primary) hypertension: Secondary | ICD-10-CM

## 2017-10-07 MED ORDER — VALSARTAN 160 MG PO TABS: 160.0000 mg | ORAL_TABLET | Freq: Two times a day (BID) | ORAL | 1 refills | Status: DC

## 2017-10-07 NOTE — Progress Notes (Signed)
Patient ID: [name] [name]                 DOB: 09-11-1966                      MRN: [id number]     HPI: [name] [name] is a 51 y.o. female referred by Dr. Allyson SabalBerry to HTN clinic. PMH includes CAD s/p CABG x 4, hypertension, and hyperlipidemia.  Losartan was discontinued and replaced for valsartan 80 twice daily during last HTN visit on 09/23/2017.  Patient took 160mg  twice daily for 1 week, by mistake, and had BP reading of 132/86 [facility]  Repeat BMET on 10/01/2017 showed stable renal function and electrolytes within normal limits.  Today patient presents for HTN follow up and denies chest pain, swelling, dizziness, headaches or increase fatigue. Her home BP monitor was determined to be inaccurate; therefore, no home BP records available today.  Current HTN meds:  Valsartan 160mg  BID x 1 week (by mistake), then varsartan 80mg  twice daily as prescibed Metoprolol tartrate 100mg  twice dialy  BP goal: 130/80  Family History: high cholesterol and HTN from father, high cholesterol from mother  Social History: denies alcohol intake, or tobacco use  Diet: low sodium , low carbohydrates, coffee twice per week  Exercise: only activities of daily living  Home BP readings: BP home device was determined to be inaccurate by cardiac rehab nurse  Wt Readings from Last 3 Encounters:  10/05/17 178 lb (80.7 kg)  09/23/17 180 lb 6.4 oz (81.8 kg)  09/17/17 181 lb (82.1 kg)   BP Readings from Last 3 Encounters:  10/07/17 (!) 174/100  10/05/17 132/80  09/23/17 (!) 180/110   Pulse Readings from Last 3 Encounters:  10/07/17 78  10/05/17 87  09/23/17 82    Past Medical History:  Diagnosis Date  . Coronary artery disease   . Diabetes mellitus without complication (HCC)   . Hyperlipidemia   . Hypertension   . Obesity     Current Outpatient Medications on File Prior to Visit  Medication Sig Dispense Refill  . aspirin 325 MG EC tablet TAKE 1 TABLET BY MOUTH EVERY DAY 30  tablet 6  . atorvastatin (LIPITOR) 40 MG tablet Take 1 tablet (40 mg total) by mouth daily. 90 tablet 3  . glyBURIDE (DIABETA) 5 MG tablet Take 5 mg by mouth daily.    . metFORMIN (GLUCOPHAGE) 500 MG tablet Take 1,000 mg by mouth 2 (two) times daily.     . metoprolol tartrate (LOPRESSOR) 100 MG tablet Take 1 tablet (100 mg total) by mouth 2 (two) times daily. 180 tablet 1  . polyethylene glycol (MIRALAX / GLYCOLAX) packet Take 17 g by mouth daily. (Patient not taking: Reported on 10/05/2017) 14 each 0   No current facility-administered medications on file prior to visit.     Allergies  Allergen Reactions  . Penicillins Hives    Blood pressure (!) 174/100, pulse 78, SpO2 98 %.  Essential hypertension Blood pressure remains out of control and no home BP reading available for assessment.  Patient denies problems with dizziness, headaches or chest pain. Noted patient took wrong valsartan dose for 1 week and BP was better controlled. Will increase valsartan to previously effective dose of 160mg  twice daily, continue lifestyle modifications, and follow up in 2 weeks. Plan to add amlodipine to current regimen if additional BP control needed during next office visit.   Shilpa Bushee Rodriguez-Guzman PharmD, BCPS, CPP [facility] [address] [place] [phone] 10/07/2017 4:55 PM

## 2017-10-07 NOTE — Assessment & Plan Note (Signed)
Blood pressure remains out of control and no home BP reading available for assessment.  Patient denies problems with dizziness, headaches or chest pain. Noted patient took wrong valsartan dose for 1 week and BP was better controlled. Will increase valsartan to previously effective dose of 160mg  twice daily, continue lifestyle modifications, and follow up in 2 weeks. Plan to add amlodipine to current regimen if additional BP control needed during next office visit.

## 2017-10-07 NOTE — Patient Instructions (Addendum)
Return for a  follow up appointment in 2 weeks  Your blood pressure today is 174/100 pulse 78  Check your blood pressure at home daily (if able) and keep record of the readings.  Take your BP meds as follows: *INCREASE valsartan to 160mg  twice daily*  Bring all of your meds, your BP cuff and your record of home blood pressures to your next appointment.  Exercise as you're able, try to walk approximately 30 minutes per day.  Keep salt intake to a minimum, especially watch canned and prepared boxed foods.  Eat more fresh fruits and vegetables and fewer canned items.  Avoid eating in fast food restaurants.    HOW TO TAKE YOUR BLOOD PRESSURE: . Rest 5 minutes before taking your blood pressure. .  Don't smoke or drink caffeinated beverages for at least 30 minutes before. . Take your blood pressure before (not after) you eat. . Sit comfortably with your back supported and both feet on the floor (don't cross your legs). . Elevate your arm to heart level on a table or a desk. . Use the proper sized cuff. It should fit smoothly and snugly around your bare upper arm. There should be enough room to slip a fingertip under the cuff. The bottom edge of the cuff should be 1 inch above the crease of the elbow. . Ideally, take 3 measurements at one sitting and record the average.

## 2017-10-08 ENCOUNTER — Encounter (HOSPITAL_COMMUNITY)
Admission: RE | Admit: 2017-10-08 | Discharge: 2017-10-08 | Disposition: A | Discharge status: Home / Self Care | Payer: 59 | Source: Ambulatory Visit | Attending: Cardiovascular Disease | Admitting: Cardiovascular Disease

## 2017-10-08 ENCOUNTER — Encounter (HOSPITAL_COMMUNITY): Payer: 59

## 2017-10-08 DIAGNOSIS — Z79899 Other long term (current) drug therapy: Secondary | ICD-10-CM | POA: Insufficient documentation

## 2017-10-08 DIAGNOSIS — Z7984 Long term (current) use of oral hypoglycemic drugs: Secondary | ICD-10-CM | POA: Insufficient documentation

## 2017-10-08 DIAGNOSIS — Z951 Presence of aortocoronary bypass graft: Secondary | ICD-10-CM

## 2017-10-08 DIAGNOSIS — E119 Type 2 diabetes mellitus without complications: Secondary | ICD-10-CM | POA: Insufficient documentation

## 2017-10-08 DIAGNOSIS — E785 Hyperlipidemia, unspecified: Secondary | ICD-10-CM | POA: Insufficient documentation

## 2017-10-08 DIAGNOSIS — I252 Old myocardial infarction: Secondary | ICD-10-CM | POA: Insufficient documentation

## 2017-10-08 DIAGNOSIS — E669 Obesity, unspecified: Secondary | ICD-10-CM | POA: Insufficient documentation

## 2017-10-08 DIAGNOSIS — I214 Non-ST elevation (NSTEMI) myocardial infarction: Secondary | ICD-10-CM

## 2017-10-08 DIAGNOSIS — Z6835 Body mass index (BMI) 35.0-35.9, adult: Secondary | ICD-10-CM | POA: Insufficient documentation

## 2017-10-08 DIAGNOSIS — I10 Essential (primary) hypertension: Secondary | ICD-10-CM | POA: Insufficient documentation

## 2017-10-08 DIAGNOSIS — Z7982 Long term (current) use of aspirin: Secondary | ICD-10-CM | POA: Insufficient documentation

## 2017-10-10 NOTE — Progress Notes (Signed)
Please call to let patient know that the bone density results are normal and no further action needed

## 2017-10-13 ENCOUNTER — Encounter (HOSPITAL_COMMUNITY): Payer: 59

## 2017-10-14 ENCOUNTER — Encounter (HOSPITAL_COMMUNITY): Payer: Self-pay | Admitting: *Deleted

## 2017-10-14 DIAGNOSIS — I214 Non-ST elevation (NSTEMI) myocardial infarction: Secondary | ICD-10-CM

## 2017-10-14 DIAGNOSIS — Z951 Presence of aortocoronary bypass graft: Secondary | ICD-10-CM

## 2017-10-14 NOTE — Progress Notes (Signed)
Cardiac Individual Treatment Plan  Patient Details  Name: [name] [name] MRN: [id number] Date of Birth: 08-29-1966 Referring Provider:     CARDIAC REHAB PHASE II ORIENTATION from 09/02/2017 in [facility] CARDIAC REHAB  Referring Provider  Nanetta Batty, MD      Initial Encounter Date:    CARDIAC REHAB PHASE II ORIENTATION from 09/02/2017 in [facility] CARDIAC REHAB  Date  09/02/17  Referring Provider  Nanetta Batty, MD      Visit Diagnosis: NSTEMI (non-ST elevated myocardial infarction) (HCC) 07/28/17  S/P CABG x 410/12/18  Patient's Home Medications on Admission:  Current Outpatient Medications:  .  aspirin 325 MG EC tablet, TAKE 1 TABLET BY MOUTH EVERY DAY, Disp: 30 tablet, Rfl: 6 .  atorvastatin (LIPITOR) 40 MG tablet, Take 1 tablet (40 mg total) by mouth daily., Disp: 90 tablet, Rfl: 3 .  glyBURIDE (DIABETA) 5 MG tablet, Take 5 mg by mouth daily., Disp: , Rfl:  .  metFORMIN (GLUCOPHAGE) 500 MG tablet, Take 1,000 mg by mouth 2 (two) times daily. , Disp: , Rfl:  .  metoprolol tartrate (LOPRESSOR) 100 MG tablet, Take 1 tablet (100 mg total) by mouth 2 (two) times daily., Disp: 180 tablet, Rfl: 1 .  polyethylene glycol (MIRALAX / GLYCOLAX) packet, Take 17 g by mouth daily. (Patient not taking: Reported on 10/05/2017), Disp: 14 each, Rfl: 0 .  valsartan (DIOVAN) 160 MG tablet, Take 1 tablet (160 mg total) by mouth 2 (two) times daily., Disp: 60 tablet, Rfl: 1  Past Medical History: Past Medical History:  Diagnosis Date  . Coronary artery disease   . Diabetes mellitus without complication (HCC)   . Hyperlipidemia   . Hypertension   . Obesity     Tobacco Use: Social History   Tobacco Use  Smoking Status Never Smoker  Smokeless Tobacco Never Used    Labs: Recent Review Flowsheet Data    Labs for ITP Cardiac and Pulmonary Rehab Latest Ref Rng & Units 07/30/2017 07/30/2017 07/30/2017 07/30/2017 07/31/2017   Cholestrol 0 - 200  mg/dL - - - - -   LDLCALC 0 - 99 mg/dL - - - - -   HDL >40 mg/dL - - - - -   Trlycerides <150 mg/dL - - - - -   Hemoglobin A1c 4.8 - 5.6 % - - - - -   PHART 7.350 - 7.450 7.446 - 7.475(H) 7.458(H) -   PCO2ART 32.0 - 48.0 mmHg 35.8 - 38.9 38.4 -   HCO3 20.0 - 28.0 mmol/L 24.7 - 28.4(H) 26.9 -   TCO2 22 - 32 mmol/L 26 24 30 28 24    ACIDBASEDEF 0.0 - 2.0 mmol/L - - - - -   O2SAT % 94.0 - 96.0 90.0 -      Capillary Blood Glucose: Lab Results  Component Value Date   GLUCAP 135 (H) 09/08/2017   GLUCAP 135 (H) 09/06/2017   GLUCAP 185 (H) 09/06/2017   GLUCAP 96 08/05/2017   GLUCAP 46 (L) 08/05/2017     Exercise Target Goals:    Exercise Program Goal: Individual exercise prescription set with THRR, safety & activity barriers. Participant demonstrates ability to understand and report RPE using BORG scale, to self-measure pulse accurately, and to acknowledge the importance of the exercise prescription.  Exercise Prescription Goal: Starting with aerobic activity 30 plus minutes a day, 3 days per week for initial exercise prescription. Provide home exercise prescription and guidelines that participant acknowledges understanding prior to discharge.  Activity  Barriers & Risk Stratification: Activity Barriers & Cardiac Risk Stratification - 09/02/17 1057      Activity Barriers & Cardiac Risk Stratification   Activity Barriers  Deconditioning;Muscular Weakness;Incisional Pain       6 Minute Walk: 6 Minute Walk    Row Name 09/02/17 1056         6 Minute Walk   Phase  Initial     Distance  1158 feet     Walk Time  6 minutes     # of Rest Breaks  0     MPH  2.19     METS  3.54     RPE  13     VO2 Peak  12.39     Symptoms  No     Resting HR  100 bpm     Resting BP  124/78     Resting Oxygen Saturation   97 %     Exercise Oxygen Saturation  during 6 min walk  98 %     Max Ex. HR  126 bpm     Max Ex. BP  144/92     2 Minute Post BP  130/80        Oxygen Initial  Assessment:   Oxygen Re-Evaluation:   Oxygen Discharge (Final Oxygen Re-Evaluation):   Initial Exercise Prescription: Initial Exercise Prescription - 09/02/17 1000      Date of Initial Exercise RX and Referring Provider   Date  09/02/17    Referring Provider  Nanetta Batty, MD      Recumbant Bike   Level  2    Minutes  10    METs  2.5      NuStep   Level  3    SPM  80    Minutes  10    METs  2.5      Track   Laps  9    Minutes  10    METs  2.57      Prescription Details   Frequency (times per week)  3    Duration  Progress to 30 minutes of continuous aerobic without signs/symptoms of physical distress      Intensity   THRR 40-80% of Max Heartrate  68-135    Ratings of Perceived Exertion  11-13    Perceived Dyspnea  0-4      Progression   Progression  Continue to progress workloads to maintain intensity without signs/symptoms of physical distress.      Resistance Training   Training Prescription  Yes    Weight  2lbs    Reps  10-15       Perform Capillary Blood Glucose checks as needed.  Exercise Prescription Changes:  Exercise Prescription Changes    Row Name 09/06/17 [phone]             Response to Exercise   Blood Pressure (Admit)  142/84       Blood Pressure (Exercise)  142/70       Blood Pressure (Exit)  122/90       Heart Rate (Admit)  96 bpm       Heart Rate (Exercise)  122 bpm       Heart Rate (Exit)  96 bpm       Rating of Perceived Exertion (Exercise)  16       Symptoms  none       Duration  Progress to 30 minutes of  aerobic without signs/symptoms of physical distress  Intensity  THRR unchanged         Progression   Progression  Continue to progress workloads to maintain intensity without signs/symptoms of physical distress.       Average METs  2         Resistance Training   Training Prescription  No         Interval Training   Interval Training  No         Recumbant Bike   Level  2       Minutes  10         NuStep    Level  3       SPM  80       Minutes  10       METs  2.2         Track   Laps  4       Minutes  8       METs  1.87         Home Exercise Plan   Plans to continue exercise at  Home (comment)       Frequency  Add 3 additional days to program exercise sessions.          Exercise Comments:  Exercise Comments    Row Name 09/06/17 1355 10/15/17 0918         Exercise Comments  Off to a good start with exercise.  Patient has been out since 09/08/17 due to uncontrolled HTN.         Exercise Goals and Review:  Exercise Goals    Row Name 09/02/17 [phone]             Exercise Goals   Increase Physical Activity  Yes       Intervention  Provide advice, education, support and counseling about physical activity/exercise needs.;Develop an individualized exercise prescription for aerobic and resistive training based on initial evaluation findings, risk stratification, comorbidities and participant's personal goals.       Expected Outcomes  Achievement of increased cardiorespiratory fitness and enhanced flexibility, muscular endurance and strength shown through measurements of functional capacity and personal statement of participant.       Increase Strength and Stamina  Yes       Intervention  Provide advice, education, support and counseling about physical activity/exercise needs.;Develop an individualized exercise prescription for aerobic and resistive training based on initial evaluation findings, risk stratification, comorbidities and participant's personal goals.       Expected Outcomes  Achievement of increased cardiorespiratory fitness and enhanced flexibility, muscular endurance and strength shown through measurements of functional capacity and personal statement of participant.       Able to understand and use rate of perceived exertion (RPE) scale  Yes       Intervention  Provide education and explanation on how to use RPE scale       Expected Outcomes  Short Term: Able to use  RPE daily in rehab to express subjective intensity level;Long Term:  Able to use RPE to guide intensity level when exercising independently       Knowledge and understanding of Target Heart Rate Range (THRR)  Yes       Intervention  Provide education and explanation of THRR including how the numbers were predicted and where they are located for reference       Expected Outcomes  Short Term: Able to state/look up THRR;Long Term: Able to use THRR to govern intensity when exercising independently;Short  Term: Able to use daily as guideline for intensity in rehab       Able to check pulse independently  Yes       Intervention  Provide education and demonstration on how to check pulse in carotid and radial arteries.;Review the importance of being able to check your own pulse for safety during independent exercise       Expected Outcomes  Short Term: Able to explain why pulse checking is important during independent exercise;Long Term: Able to check pulse independently and accurately       Understanding of Exercise Prescription  Yes       Intervention  Provide education, explanation, and written materials on patient's individual exercise prescription       Expected Outcomes  Short Term: Able to explain program exercise prescription;Long Term: Able to explain home exercise prescription to exercise independently          Exercise Goals Re-Evaluation : Exercise Goals Re-Evaluation    Row Name 09/06/17 1355             Exercise Goal Re-Evaluation   Exercise Goals Review  Increase Physical Activity       Comments  Patient tolerated low intesnity exercise without c/o today at cardiac rehab. Pt states she's walking 20-30 minutes, 3 days/week.       Expected Outcomes  Patient will continue exercise 5-6 days per week progressing to 30 minutes.           Discharge Exercise Prescription (Final Exercise Prescription Changes): Exercise Prescription Changes - 09/06/17 0948      Response to Exercise    Blood Pressure (Admit)  142/84    Blood Pressure (Exercise)  142/70    Blood Pressure (Exit)  122/90    Heart Rate (Admit)  96 bpm    Heart Rate (Exercise)  122 bpm    Heart Rate (Exit)  96 bpm    Rating of Perceived Exertion (Exercise)  16    Symptoms  none    Duration  Progress to 30 minutes of  aerobic without signs/symptoms of physical distress    Intensity  THRR unchanged      Progression   Progression  Continue to progress workloads to maintain intensity without signs/symptoms of physical distress.    Average METs  2      Resistance Training   Training Prescription  No      Interval Training   Interval Training  No      Recumbant Bike   Level  2    Minutes  10      NuStep   Level  3    SPM  80    Minutes  10    METs  2.2      Track   Laps  4    Minutes  8    METs  1.87      Home Exercise Plan   Plans to continue exercise at  Home (comment)    Frequency  Add 3 additional days to program exercise sessions.       Nutrition:  Target Goals: Understanding of nutrition guidelines, daily intake of sodium 1500mg , cholesterol 200mg , calories 30% from fat and 7% or less from saturated fats, daily to have 5 or more servings of fruits and vegetables.  Biometrics: Pre Biometrics - 09/02/17 1221      Pre Biometrics   Height  5' (1.524 m)    Weight  182 lb 12.2 oz (82.9 kg)  Waist Circumference  37.5 inches    Hip Circumference  44 inches    Waist to Hip Ratio  0.85 %    BMI (Calculated)  35.69    Triceps Skinfold  41 mm    % Body Fat  45.8 %    Grip Strength  18 kg    Flexibility  10 in    Single Leg Stand  30 seconds        Nutrition Therapy Plan and Nutrition Goals: Nutrition Therapy & Goals - 09/02/17 1052      Nutrition Therapy   Diet  Carb Modified, Mediterranean      Personal Nutrition Goals   Nutrition Goal  Pt to identify food quantities necessary to achieve weight loss of 6-24 lb at graduation from cardiac rehab. Long-term wt loss goal wt of  140 lb desired.     Personal Goal #2  CBG concentrations in the normal range or as close to normal as is safely possible.      Intervention Plan   Intervention  Prescribe, educate and counsel regarding individualized specific dietary modifications aiming towards targeted core components such as weight, hypertension, lipid management, diabetes, heart failure and other comorbidities.    Expected Outcomes  Short Term Goal: Understand basic principles of dietary content, such as calories, fat, sodium, cholesterol and nutrients.;Long Term Goal: Adherence to prescribed nutrition plan.       Nutrition Discharge: Nutrition Scores:   Nutrition Goals Re-Evaluation:   Nutrition Goals Re-Evaluation:   Nutrition Goals Discharge (Final Nutrition Goals Re-Evaluation):   Psychosocial: Target Goals: Acknowledge presence or absence of significant depression and/or stress, maximize coping skills, provide positive support system. Participant is able to verbalize types and ability to use techniques and skills needed for reducing stress and depression.  Initial Review & Psychosocial Screening: Initial Psych Review & Screening - 09/02/17 1216      Initial Review   Current issues with  Current Stress Concerns    Source of Stress Concerns  Chronic Illness;Family    Comments  Patient says she is having difficulty swallowing      Family Dynamics   Good Support System?  Yes      Barriers   Psychosocial barriers to participate in program  The patient should benefit from training in stress management and relaxation.      Screening Interventions   Interventions  Encouraged to exercise;To provide support and resources with identified psychosocial needs;Provide feedback about the scores to participant       Quality of Life Scores: Quality of Life - 09/06/17 1355      Quality of Life Scores   Health/Function Pre  19.5 %    Socioeconomic Pre  25.71 %    Psych/Spiritual Pre  22.07 %    Family Pre  20.7  %    GLOBAL Pre  21.49 %       PHQ-9: Recent Review Flowsheet Data    [facility] 09/06/2017 01/10/2014   Decreased Interest 0 0   Down, Depressed, Hopeless 0 0   PHQ - 2 Score 0 0     Interpretation of Total Score  Total Score Depression Severity:  1-4 = Minimal depression, 5-9 = Mild depression, 10-14 = Moderate depression, 15-19 = Moderately severe depression, 20-27 = Severe depression   Psychosocial Evaluation and Intervention:   Psychosocial Re-Evaluation: Psychosocial Re-Evaluation    Row Name 10/14/17 [id number]             Psychosocial Re-Evaluation  Current issues with  Current Stress Concerns       Comments  [name] [name] has continued health concerns due to continued issues with HTN       Interventions  Encouraged to attend Cardiac Rehabilitation for the exercise;Relaxation education         Initial Review   Source of Stress Concerns  Chronic Illness          Psychosocial Discharge (Final Psychosocial Re-Evaluation): Psychosocial Re-Evaluation - 10/14/17 0846      Psychosocial Re-Evaluation   Current issues with  Current Stress Concerns    Comments  [name] [name] has continued health concerns due to continued issues with HTN    Interventions  Encouraged to attend Cardiac Rehabilitation for the exercise;Relaxation education      Initial Review   Source of Stress Concerns  Chronic Illness       Vocational Rehabilitation: Provide vocational rehab assistance to qualifying candidates.   Vocational Rehab Evaluation & Intervention: Vocational Rehab - 09/06/17 0958      Initial Vocational Rehab Evaluation & Intervention   Assessment shows need for Vocational Rehabilitation  No       Education: Education Goals: Education classes will be provided on a weekly basis, covering required topics. Participant will state understanding/return demonstration of topics presented.  Learning Barriers/Preferences: Learning Barriers/Preferences - 09/02/17 [id number]      Learning  Barriers/Preferences   Learning Barriers  Sight glasses    Learning Preferences  Skilled Demonstration;Written Material;Video;Pictoral       Education Topics: Count Your Pulse:  -Group instruction provided by verbal instruction, demonstration, patient participation and written materials to support subject.  Instructors address importance of being able to find your pulse and how to count your pulse when at home without a heart monitor.  Patients get hands on experience counting their pulse with staff help and individually.   Heart Attack, Angina, and Risk Factor Modification:  -Group instruction provided by verbal instruction, video, and written materials to support subject.  Instructors address signs and symptoms of angina and heart attacks.    Also discuss risk factors for heart disease and how to make changes to improve heart health risk factors.   Functional Fitness:  -Group instruction provided by verbal instruction, demonstration, patient participation, and written materials to support subject.  Instructors address safety measures for doing things around the house.  Discuss how to get up and down off the floor, how to pick things up properly, how to safely get out of a chair without assistance, and balance training.   Meditation and Mindfulness:  -Group instruction provided by verbal instruction, patient participation, and written materials to support subject.  Instructor addresses importance of mindfulness and meditation practice to help reduce stress and improve awareness.  Instructor also leads participants through a meditation exercise.    Stretching for Flexibility and Mobility:  -Group instruction provided by verbal instruction, patient participation, and written materials to support subject.  Instructors lead participants through series of stretches that are designed to increase flexibility thus improving mobility.  These stretches are additional exercise for major muscle groups  that are typically performed during regular warm up and cool down.   Hands Only CPR:  -Group verbal, video, and participation provides a basic overview of AHA guidelines for community CPR. Role-play of emergencies allow participants the opportunity to practice calling for help and chest compression technique with discussion of AED use.   Hypertension: -Group verbal and written instruction that provides a basic overview of hypertension including the most  recent diagnostic guidelines, risk factor reduction with self-care instructions and medication management.    Nutrition I class: Heart Healthy Eating:  -Group instruction provided by PowerPoint slides, verbal discussion, and written materials to support subject matter. The instructor gives an explanation and review of the Therapeutic Lifestyle Changes diet recommendations, which includes a discussion on lipid goals, dietary fat, sodium, fiber, plant stanol/sterol esters, sugar, and the components of a well-balanced, healthy diet.   Nutrition II class: Lifestyle Skills:  -Group instruction provided by PowerPoint slides, verbal discussion, and written materials to support subject matter. The instructor gives an explanation and review of label reading, grocery shopping for heart health, heart healthy recipe modifications, and ways to make healthier choices when eating out.   Diabetes Question & Answer:  -Group instruction provided by PowerPoint slides, verbal discussion, and written materials to support subject matter. The instructor gives an explanation and review of diabetes co-morbidities, pre- and post-prandial blood glucose goals, pre-exercise blood glucose goals, signs, symptoms, and treatment of hypoglycemia and hyperglycemia, and foot care basics.   Diabetes Blitz:  -Group instruction provided by PowerPoint slides, verbal discussion, and written materials to support subject matter. The instructor gives an explanation and review of the  physiology behind type 1 and type 2 diabetes, diabetes medications and rational behind using different medications, pre- and post-prandial blood glucose recommendations and Hemoglobin A1c goals, diabetes diet, and exercise including blood glucose guidelines for exercising safely.    Portion Distortion:  -Group instruction provided by PowerPoint slides, verbal discussion, written materials, and food models to support subject matter. The instructor gives an explanation of serving size versus portion size, changes in portions sizes over the last 20 years, and what consists of a serving from each food group.   Stress Management:  -Group instruction provided by verbal instruction, video, and written materials to support subject matter.  Instructors review role of stress in heart disease and how to cope with stress positively.     Exercising on Your Own:  -Group instruction provided by verbal instruction, power point, and written materials to support subject.  Instructors discuss benefits of exercise, components of exercise, frequency and intensity of exercise, and end points for exercise.  Also discuss use of nitroglycerin and activating EMS.  Review options of places to exercise outside of rehab.  Review guidelines for sex with heart disease.   Cardiac Drugs I:  -Group instruction provided by verbal instruction and written materials to support subject.  Instructor reviews cardiac drug classes: antiplatelets, anticoagulants, beta blockers, and statins.  Instructor discusses reasons, side effects, and lifestyle considerations for each drug class.   Cardiac Drugs II:  -Group instruction provided by verbal instruction and written materials to support subject.  Instructor reviews cardiac drug classes: angiotensin converting enzyme inhibitors (ACE-I), angiotensin II receptor blockers (ARBs), nitrates, and calcium channel blockers.  Instructor discusses reasons, side effects, and lifestyle considerations  for each drug class.   Anatomy and Physiology of the Circulatory System:  Group verbal and written instruction and models provide basic cardiac anatomy and physiology, with the coronary electrical and arterial systems. Review of: AMI, Angina, Valve disease, Heart Failure, Peripheral Artery Disease, Cardiac Arrhythmia, Pacemakers, and the ICD.   Other Education:  -Group or individual verbal, written, or video instructions that support the educational goals of the cardiac rehab program.   Knowledge Questionnaire Score: Knowledge Questionnaire Score - 09/06/17 1354      Knowledge Questionnaire Score   Pre Score  18/24  Core Components/Risk Factors/Patient Goals at Admission: Personal Goals and Risk Factors at Admission - 09/02/17 1104      Core Components/Risk Factors/Patient Goals on Admission    Weight Management  Yes;Obesity;Weight Maintenance;Weight Loss    Intervention  Weight Management: Develop a combined nutrition and exercise program designed to reach desired caloric intake, while maintaining appropriate intake of nutrient and fiber, sodium and fats, and appropriate energy expenditure required for the weight goal.;Weight Management: Provide education and appropriate resources to help participant work on and attain dietary goals.;Weight Management/Obesity: Establish reasonable short term and long term weight goals.    Admit Weight  182 lb 12.2 oz (82.9 kg)    Goal Weight: Short Term  160 lb (72.6 kg)    Goal Weight: Long Term  140 lb (63.5 kg)    Expected Outcomes  Short Term: Continue to assess and modify interventions until short term weight is achieved;Long Term: Adherence to nutrition and physical activity/exercise program aimed toward attainment of established weight goal;Weight Maintenance: Understanding of the daily nutrition guidelines, which includes 25-35% calories from fat, 7% or less cal from saturated fats, less than 200mg  cholesterol, less than 1.5gm of sodium, & 5  or more servings of fruits and vegetables daily;Weight Loss: Understanding of general recommendations for a balanced deficit meal plan, which promotes 1-2 lb weight loss per week and includes a negative energy balance of [phone] kcal/d;Understanding of distribution of calorie intake throughout the day with the consumption of 4-5 meals/snacks;Understanding recommendations for meals to include 15-35% energy as protein, 25-35% energy from fat, 35-60% energy from carbohydrates, less than 200mg  of dietary cholesterol, 20-35 gm of total fiber daily    Diabetes  Yes    Intervention  Provide education about signs/symptoms and action to take for hypo/hyperglycemia.;Provide education about proper nutrition, including hydration, and aerobic/resistive exercise prescription along with prescribed medications to achieve blood glucose in normal ranges: Fasting glucose 65-99 mg/dL    Expected Outcomes  Short Term: Participant verbalizes understanding of the signs/symptoms and immediate care of hyper/hypoglycemia, proper foot care and importance of medication, aerobic/resistive exercise and nutrition plan for blood glucose control.;Long Term: Attainment of HbA1C < 7%.    Hypertension  Yes    Intervention  Monitor prescription use compliance.;Provide education on lifestyle modifcations including regular physical activity/exercise, weight management, moderate sodium restriction and increased consumption of fresh fruit, vegetables, and low fat dairy, alcohol moderation, and smoking cessation.    Expected Outcomes  Short Term: Continued assessment and intervention until BP is < 140/55mm HG in hypertensive participants. < 130/53mm HG in hypertensive participants with diabetes, heart failure or chronic kidney disease.;Long Term: Maintenance of blood pressure at goal levels.    Lipids  Yes    Intervention  Provide education and support for participant on nutrition & aerobic/resistive exercise along with prescribed medications to  achieve LDL 70mg , HDL >40mg .    Expected Outcomes  Short Term: Participant states understanding of desired cholesterol values and is compliant with medications prescribed. Participant is following exercise prescription and nutrition guidelines.;Long Term: Cholesterol controlled with medications as prescribed, with individualized exercise RX and with personalized nutrition plan. Value goals: LDL < 70mg , HDL > 40 mg.    Stress  Yes    Intervention  Offer individual and/or small group education and counseling on adjustment to heart disease, stress management and health-related lifestyle change. Teach and support self-help strategies.;Refer participants experiencing significant psychosocial distress to appropriate mental health specialists for further evaluation and treatment. When possible, include family members and  significant others in education/counseling sessions.    Expected Outcomes  Short Term: Participant demonstrates changes in health-related behavior, relaxation and other stress management skills, ability to obtain effective social support, and compliance with psychotropic medications if prescribed.;Long Term: Emotional wellbeing is indicated by absence of clinically significant psychosocial distress or social isolation.       Core Components/Risk Factors/Patient Goals Review:  Goals and Risk Factor Review    Row Name 10/14/17 0851             Core Components/Risk Factors/Patient Goals Review   Personal Goals Review  Weight Management/Obesity;Stress;Hypertension;Diabetes;Lipids       Review  [name] [name] has continued issues HTN and has been on hold       Expected Outcomes  [name] [name] will continue to take her medications, HTN diabetes and hyperlipidiemia. Gissell will return to exercise once she is cleared by Dr Allyson Sabal          Core Components/Risk Factors/Patient Goals at Discharge (Final Review):  Goals and Risk Factor Review - 10/14/17 0851      Core Components/Risk Factors/Patient Goals Review    Personal Goals Review  Weight Management/Obesity;Stress;Hypertension;Diabetes;Lipids    Review  [name] [name] has continued issues HTN and has been on hold    Expected Outcomes  [name] [name] will continue to take her medications, HTN diabetes and hyperlipidiemia. Oaklee will return to exercise once she is cleared by Dr Allyson Sabal       ITP Comments: ITP Comments    Row Name 09/02/17 [phone] 09/17/17 1622 10/14/17 0845       ITP Comments  Dr. Armanda Magic, Medical Director  30 Day ITP review, [name] [name]'s exercise is on hold until her blood pressures are better controlled  30 Day ITP review, [name] [name]'s exercise is on hold until her blood pressures are better controlled        Comments: See 30 day ITP comment. [name] [name] has attended 2 exercise sessions. [name] [name]'s exercise is on hold currently. Gladstone Lighter, RN,BSN 10/15/2017 2:02 PM

## 2017-10-15 ENCOUNTER — Encounter (HOSPITAL_COMMUNITY): Payer: 59

## 2017-10-15 NOTE — Addendum Note (Signed)
Encounter addended by: Artist Paisichards, Valaree Fresquez M on: 10/15/2017 9:19 AM  Actions taken: Flowsheet data copied forward, Visit Navigator Flowsheet section accepted

## 2017-10-18 ENCOUNTER — Encounter (HOSPITAL_COMMUNITY): Payer: 59

## 2017-10-20 ENCOUNTER — Encounter (HOSPITAL_COMMUNITY): Payer: 59

## 2017-10-20 NOTE — Progress Notes (Signed)
Patient ID: NEHEMIE [name]                 DOB: 09-03-1966                      MRN: [id number]     HPI: [name] [name] is a 52 y.o. female referred by Dr. Allyson Sabal to HTN clinic. 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   She was originally on losartan, but switched to valsartan and increased over visits to 160 mg twice daily.   She is frustrated by a lack of response in blood pressure medications at this time.  I pointed out to her that she is only on one medication, and it often will take 2-3 to get good control.  She denies chest pain or shortness of breath, but does complain of being easily fatigued.  She states there is some swelling in her ankles/legs during the day, but describes it as a trace and that it is not bothersome.   She also has some concerns about the ongoing recalls for valsartan.  She isn't sure if she wants to continue with this medication, but was just as hesitant when I offered to switch it for her.  Because of the ongoing [facility] recall concerns, I would offer to give her lisinopril or benazepril   Current HTN meds:  Valsartan 160 mg bid Metoprolol tartrate 100mg  twice dialy  BP goal: 130/80  Family History: high cholesterol and HTN from father, high cholesterol from mother  Social History: denies alcohol intake, or tobacco use  Diet: low sodium , low carbohydrates, coffee twice per week  Exercise: only activities of daily living; has been to cardiac rehab only a few times due to elevated blood pressures  Home BP readings: BP home device was determined to be inaccurate by cardiac rehab nurse  Wt Readings from Last 3 Encounters:  10/05/17 178 lb (80.7 kg)  09/23/17 180 lb 6.4 oz (81.8 kg)  09/17/17 181 lb (82.1 kg)   BP Readings from Last 3 Encounters:  10/21/17 (!) 156/100  10/07/17 (!) 174/100  10/05/17 132/80   Pulse Readings from Last 3 Encounters:  10/07/17 78  10/05/17 87  09/23/17 82    Past Medical  History:  Diagnosis Date  . Coronary artery disease   . Diabetes mellitus without complication (HCC)   . Hyperlipidemia   . Hypertension   . Obesity     Current Outpatient Medications on File Prior to Visit  Medication Sig Dispense Refill  . aspirin 325 MG EC tablet TAKE 1 TABLET BY MOUTH EVERY DAY 30 tablet 6  . atorvastatin (LIPITOR) 40 MG tablet Take 1 tablet (40 mg total) by mouth daily. 90 tablet 3  . glyBURIDE (DIABETA) 5 MG tablet Take 5 mg by mouth daily.    . metFORMIN (GLUCOPHAGE) 500 MG tablet Take 1,000 mg by mouth 2 (two) times daily.     . metoprolol tartrate (LOPRESSOR) 100 MG tablet Take 1 tablet (100 mg total) by mouth 2 (two) times daily. 180 tablet 1  . polyethylene glycol (MIRALAX / GLYCOLAX) packet Take 17 g by mouth daily. (Patient not taking: Reported on 10/05/2017) 14 each 0  . valsartan (DIOVAN) 160 MG tablet Take 1 tablet (160 mg total) by mouth 2 (two) times daily. 60 tablet 1   No current facility-administered medications on file prior to visit.     Allergies  Allergen Reactions  .  Penicillins Hives    Blood pressure (!) 156/100.  Essential hypertension Patient with continued elevated hypertension now on maximum dose valsartan.   Will add amlodipine 5 mg once daily today.  Patient was advised to call the office if any concerns, as she has a history of lower extremity edema.  She will hopefully be able to get back to cardiac rehab this next week which will help with her fatigue issues.  We will see her back in 3 weeks for follow up.    Phillips HayKristin Damarius Karnes PharmD CPP [facility] [address] [place] [phone] 10/21/2017 6:46 PM

## 2017-10-21 ENCOUNTER — Ambulatory Visit (INDEPENDENT_AMBULATORY_CARE_PROVIDER_SITE_OTHER): Payer: Self-pay | Admitting: Pharmacist Clinician (PhC)/ Clinical Pharmacy Specialist

## 2017-10-21 ENCOUNTER — Encounter: Payer: Self-pay | Admitting: Pharmacist Clinician (PhC)/ Clinical Pharmacy Specialist

## 2017-10-21 DIAGNOSIS — I10 Essential (primary) hypertension: Secondary | ICD-10-CM

## 2017-10-21 MED ORDER — AMLODIPINE BESYLATE 5 MG PO TABS: 5.0000 mg | ORAL_TABLET | Freq: Every day | ORAL | 3 refills | Status: DC

## 2017-10-21 NOTE — Patient Instructions (Signed)
Return for a a follow up appointment in 3 weeks  Your blood pressure today is 156/100  Take your BP meds as follows:  Add amlodipine 5 mg once daily  Continue with all other medications  Bring all of your meds, your BP cuff and your record of home blood pressures to your next appointment.  Exercise as you're able, try to walk approximately 30 minutes per day.  Keep salt intake to a minimum, especially watch canned and prepared boxed foods.  Eat more fresh fruits and vegetables and fewer canned items.  Avoid eating in fast food restaurants.    HOW TO TAKE YOUR BLOOD PRESSURE: . Rest 5 minutes before taking your blood pressure. .  Don't smoke or drink caffeinated beverages for at least 30 minutes before. . Take your blood pressure before (not after) you eat. . Sit comfortably with your back supported and both feet on the floor (don't cross your legs). . Elevate your arm to heart level on a table or a desk. . Use the proper sized cuff. It should fit smoothly and snugly around your bare upper arm. There should be enough room to slip a fingertip under the cuff. The bottom edge of the cuff should be 1 inch above the crease of the elbow. . Ideally, take 3 measurements at one sitting and record the average.

## 2017-10-21 NOTE — Assessment & Plan Note (Signed)
Patient with continued elevated hypertension now on maximum dose valsartan.   Will add amlodipine 5 mg once daily today.  Patient was advised to call the office if any concerns, as she has a history of lower extremity edema.  She will hopefully be able to get back to cardiac rehab this next week which will help with her fatigue issues.  We will see her back in 3 weeks for follow up.

## 2017-10-22 ENCOUNTER — Encounter (HOSPITAL_COMMUNITY): Payer: 59

## 2017-10-25 ENCOUNTER — Encounter (HOSPITAL_COMMUNITY): Payer: 59

## 2017-10-25 ENCOUNTER — Telehealth: Payer: Self-pay | Admitting: Pharmacist

## 2017-10-25 NOTE — Telephone Encounter (Signed)
Patient called requesting call back. No additional information provided.  LMOM; Now unable to reach patient at provided cell phone number.

## 2017-10-26 ENCOUNTER — Telehealth: Payer: Self-pay | Admitting: Cardiovascular Disease

## 2017-10-26 ENCOUNTER — Encounter: Payer: Self-pay | Admitting: Cardiovascular Disease

## 2017-10-26 NOTE — Telephone Encounter (Signed)
Will forward to Dr Lubertha Basqueaylor S CMA

## 2017-10-26 NOTE — Telephone Encounter (Signed)
Patient states issue has been addressed.  Thanked me for calling

## 2017-10-26 NOTE — Telephone Encounter (Signed)
Spoke to pt. She originally wanted to try to extend her return to work date to January 22. She talked with her insurance company who stated that may not be covered and her FMLA has run out. She stated that her boss is willing to try to work with her Cardiac rehab schedule.   Told pt I will have a new letter with return to work date listed for today signed and faxed to [phone] in the morning when Dr. Allyson SabalBerry is back in the office. Pt verbalized thanks and understanding.

## 2017-10-26 NOTE — Telephone Encounter (Signed)
Pt says she needs a letter to go back to work as of today's date please.

## 2017-10-27 ENCOUNTER — Telehealth: Payer: Self-pay | Admitting: Cardiovascular Disease

## 2017-10-27 ENCOUNTER — Encounter (HOSPITAL_COMMUNITY): Payer: 59

## 2017-10-27 ENCOUNTER — Encounter: Payer: Self-pay | Admitting: Cardiovascular Disease

## 2017-10-27 NOTE — Telephone Encounter (Signed)
Follow up     Patient called wanting to speak with Ladona Ridgelaylor, told patient I would have to have [facility] back and got hung up on.

## 2017-10-27 NOTE — Telephone Encounter (Signed)
Letter signed by Dr. Allyson SabalBerry and will have faxed to number provided today.

## 2017-10-27 NOTE — Telephone Encounter (Signed)
Informed pt. Pt verbalized thanks.

## 2017-10-27 NOTE — Telephone Encounter (Signed)
Spoke with patient and she is going to just pick up letter

## 2017-10-27 NOTE — Telephone Encounter (Signed)
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 Please call

## 2017-10-27 NOTE — Telephone Encounter (Signed)
Follow up  Patient is calling back about letter for her return to work. She indicates that the letter must indicate that she can return to work with no restrictions.

## 2017-10-27 NOTE — Telephone Encounter (Signed)
F/U call: Patient calling for status of the letter stating that she can return to work.

## 2017-10-27 NOTE — Telephone Encounter (Signed)
Returned call to patient she stated she needs letter to return to work say with no restrictions.Stated she needs today if possible.Fax to Ar[employer] GeorgiaHeather Harris at fax # [phone].

## 2017-10-29 ENCOUNTER — Encounter (HOSPITAL_COMMUNITY): Payer: 59

## 2017-11-01 ENCOUNTER — Encounter (HOSPITAL_COMMUNITY): Payer: 59

## 2017-11-03 ENCOUNTER — Encounter (HOSPITAL_COMMUNITY): Payer: 59

## 2017-11-03 ENCOUNTER — Encounter (HOSPITAL_COMMUNITY): Payer: Self-pay

## 2017-11-05 ENCOUNTER — Encounter (HOSPITAL_COMMUNITY): Payer: Self-pay

## 2017-11-05 ENCOUNTER — Encounter (HOSPITAL_COMMUNITY): Payer: 59

## 2017-11-08 ENCOUNTER — Encounter (HOSPITAL_COMMUNITY): Payer: 59

## 2017-11-08 ENCOUNTER — Encounter (HOSPITAL_COMMUNITY)
Admission: RE | Admit: 2017-11-08 | Discharge: 2017-11-08 | Disposition: A | Discharge status: Home / Self Care | Payer: Self-pay | Source: Ambulatory Visit

## 2017-11-09 ENCOUNTER — Ambulatory Visit (INDEPENDENT_AMBULATORY_CARE_PROVIDER_SITE_OTHER): Payer: Managed Care, Other (non HMO) | Admitting: Pharmacist Clinician (PhC)/ Clinical Pharmacy Specialist

## 2017-11-09 ENCOUNTER — Encounter (HOSPITAL_COMMUNITY): Payer: Self-pay | Admitting: *Deleted

## 2017-11-09 DIAGNOSIS — I214 Non-ST elevation (NSTEMI) myocardial infarction: Secondary | ICD-10-CM

## 2017-11-09 DIAGNOSIS — I10 Essential (primary) hypertension: Secondary | ICD-10-CM | POA: Diagnosis not present

## 2017-11-09 DIAGNOSIS — Z951 Presence of aortocoronary bypass graft: Secondary | ICD-10-CM

## 2017-11-09 MED ORDER — AMLODIPINE BESYLATE 10 MG PO TABS: 10.0000 mg | ORAL_TABLET | Freq: Every day | ORAL | 3 refills | Status: DC

## 2017-11-09 NOTE — Assessment & Plan Note (Signed)
Patient is very discouraged at lack of change in her blood pressure.  It was explained that she may need 3 or more medications to control it, not counting metoprolol.  Today I will increase amlodipine to 10 mg once daily, but since she has had some mild lower extremity edema I gave her our phone number in case it becomes more pronounced.   She will see Dr. Allyson SabalBerry in 2 weeks for follow up and we can see her again after that.   She is still not on any diuretics, so if her pressure is unchanged at her next visit I would consider using either chlorthalidone or spironolactone.  I don't think she would get any benefit from HCTZ at this point.  I also told her that if her pressure remains elevated we will look at secondary causes as well.

## 2017-11-09 NOTE — Patient Instructions (Addendum)
Return for a a follow up appointment in 2 weeks Call before then if you notice swelling in your ankles or feet.  [name] [name]/[name] [name] at [phone]  Your blood pressure today is 150/96  Take your BP meds as follows:  Increase amlodipine to 10 mg daily  Continue with all other medications    Bring all of your meds, your BP cuff and your record of home blood pressures to your next appointment.  Exercise as you're able, try to walk approximately 30 minutes per day.  Keep salt intake to a minimum, especially watch canned and prepared boxed foods.  Eat more fresh fruits and vegetables and fewer canned items.  Avoid eating in fast food restaurants.    HOW TO TAKE YOUR BLOOD PRESSURE: . Rest 5 minutes before taking your blood pressure. .  Don't smoke or drink caffeinated beverages for at least 30 minutes before. . Take your blood pressure before (not after) you eat. . Sit comfortably with your back supported and both feet on the floor (don't cross your legs). . Elevate your arm to heart level on a table or a desk. . Use the proper sized cuff. It should fit smoothly and snugly around your bare upper arm. There should be enough room to slip a fingertip under the cuff. The bottom edge of the cuff should be 1 inch above the crease of the elbow. . Ideally, take 3 measurements at one sitting and record the average.   36

## 2017-11-09 NOTE — Progress Notes (Signed)
Discharge Progress Report  Patient Details  Name: [name] [name] MRN: [id number] Date of Birth: [birth year] Referring Provider:     CARDIAC REHAB PHASE II ORIENTATION [facility]02/2017 [facility]  Referring Provider  Nanetta BattyBerry, [name], MD       Number of Visits: 2  Reason for Discharge:  Early Exit:  Lack of attendance  Smoking History:  Social History   Tobacco Use  Smoking Status Never Smoker  Smokeless Tobacco Never Used    Diagnosis:  NSTEMI (non-ST elevated myocardial infarction) (HCC) 07/28/17  S/P CABG x 410/12/18  ADL UCSD:   Initial Exercise Prescription:   Discharge Exercise Prescription (Final Exercise Prescription Changes):   Functional Capacity:   Psychological, QOL, Others - Outcomes: PHQ 2/9: Depression screen [facility] 09/06/2017 01/10/2014  Decreased Interest 0 0  Down, Depressed, Hopeless 0 0  PHQ - 2 Score 0 0    Quality of Life:   Personal Goals: Goals established at orientation with interventions provided to work toward goal.    Personal Goals Discharge: Goals and Risk Factor Review    Row Name 10/14/17 [phone]             Core Components/Risk Factors/Patient Goals Review   Personal Goals Review  Weight Management/Obesity;Stress;Hypertension;Diabetes;Lipids       Review  [name] [name] has continued issues HTN and has been on hold       Expected Outcomes  [name] [name] will continue to take her medications, HTN diabetes and hyperlipidiemia. [name] [name] will return to exercise once she is cleared by Dr Allyson SabalBerry          Exercise Goals and Review:   Nutrition & Weight - Outcomes:    Nutrition:   Nutrition Discharge:   Education Questionnaire Score:  [name] [name] attended two exercise sessions on 09/02/17 and 09/06/17. [name] [name] was not able to return to exercise due to continued issues with elevated blood pressures. [name] [name] has been discharged due to nonattendance.Gladstone LighterMaria Whitaker, RN,BSN 11/23/2017 4:53 PM

## 2017-11-09 NOTE — Progress Notes (Signed)
Patient ID: [name] [name]                 DOB: 08/10/1966                      MRN: [id number]     HPI: [name] [name] is a 52 y.o. female referred by Dr. Allyson Sabal to HTN clinic. 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   She was originally on losartan, but switched to valsartan and increased over visits to 160 mg twice daily.   At her last visit about 2 weeks ago.  She denies chest pain or shortness of breath, but does complain of occasional chest wall tenderness.  She states there is some swelling in her ankles/legs during the day, but describes it as a trace and that it is not bothersome.   Current HTN meds:  Valsartan 160 mg bid Metoprolol tartrate 100mg  bid Amlodipine 5 mg qd - am  BP goal: 130/80  Family History: high cholesterol and HTN from father, high cholesterol from mother  Social History: denies alcohol intake, or tobacco use  Diet: low sodium , low carbohydrates, coffee twice per week  Exercise: only activities of daily living; has set up schedule for cardiac rehab but trying to work around her work schedule.   Went to [place] Jan 13-16 for business, walked a lot for those days  Home BP readings: home cuff shown to be inaccurate.  Wt Readings from Last 3 Encounters:  10/05/17 178 lb (80.7 kg)  09/23/17 180 lb 6.4 oz (81.8 kg)  09/17/17 181 lb (82.1 kg)   BP Readings from Last 3 Encounters:  11/09/17 (!) 152/96  10/21/17 (!) 156/100  10/07/17 (!) 174/100   Pulse Readings from Last 3 Encounters:  11/09/17 84  10/07/17 78  10/05/17 87    Past Medical History:  Diagnosis Date  . Coronary artery disease   . Diabetes mellitus without complication (HCC)   . Hyperlipidemia   . Hypertension   . Obesity     Current Outpatient Medications on File Prior to Visit  Medication Sig Dispense Refill  . aspirin 325 MG EC tablet TAKE 1 TABLET BY MOUTH EVERY DAY 30 tablet 6  . atorvastatin (LIPITOR) 40 MG tablet Take 1 tablet  (40 mg total) by mouth daily. 90 tablet 3  . glyBURIDE (DIABETA) 5 MG tablet Take 5 mg by mouth daily.    . metFORMIN (GLUCOPHAGE) 500 MG tablet Take 1,000 mg by mouth 2 (two) times daily.     . metoprolol tartrate (LOPRESSOR) 100 MG tablet Take 1 tablet (100 mg total) by mouth 2 (two) times daily. 180 tablet 1  . polyethylene glycol (MIRALAX / GLYCOLAX) packet Take 17 g by mouth daily. (Patient not taking: Reported on 10/05/2017) 14 each 0  . valsartan (DIOVAN) 160 MG tablet Take 1 tablet (160 mg total) by mouth 2 (two) times daily. 60 tablet 1   No current facility-administered medications on file prior to visit.     Allergies  Allergen Reactions  . Penicillins Hives    Blood pressure (!) 152/96, pulse 84.  Essential hypertension Patient is very discouraged at lack of change in her blood pressure.  It was explained that she may need 3 or more medications to control it, not counting metoprolol.  Today I will increase amlodipine to 10 mg once daily, but since she has had some mild lower extremity edema I gave her  our phone number in case it becomes more pronounced.   She will see Dr. Allyson SabalBerry in 2 weeks for follow up and we can see her again after that.   She is still not on any diuretics, so if her pressure is unchanged at her next visit I would consider using either chlorthalidone or spironolactone.  I don't think she would get any benefit from HCTZ at this point.  I also told her that if her pressure remains elevated we will look at secondary causes as well.    Phillips HayKristin Fruma [place] PharmD CPP [facility] [address] [place] [phone] 11/09/2017 3:45 PM

## 2017-11-10 ENCOUNTER — Encounter (HOSPITAL_COMMUNITY): Payer: 59

## 2017-11-10 ENCOUNTER — Encounter (HOSPITAL_COMMUNITY): Payer: Self-pay

## 2017-11-12 ENCOUNTER — Encounter (HOSPITAL_COMMUNITY): Payer: Self-pay

## 2017-11-12 ENCOUNTER — Encounter (HOSPITAL_COMMUNITY): Payer: 59

## 2017-11-15 ENCOUNTER — Encounter (HOSPITAL_COMMUNITY): Payer: Self-pay

## 2017-11-15 ENCOUNTER — Encounter (HOSPITAL_COMMUNITY): Payer: 59

## 2017-11-17 ENCOUNTER — Encounter (HOSPITAL_COMMUNITY): Payer: Self-pay

## 2017-11-17 ENCOUNTER — Encounter (HOSPITAL_COMMUNITY): Payer: 59

## 2017-11-19 ENCOUNTER — Encounter (HOSPITAL_COMMUNITY): Payer: Self-pay

## 2017-11-19 ENCOUNTER — Encounter (HOSPITAL_COMMUNITY): Payer: 59

## 2017-11-22 ENCOUNTER — Encounter (HOSPITAL_COMMUNITY): Payer: 59

## 2017-11-22 ENCOUNTER — Encounter (HOSPITAL_COMMUNITY): Payer: Self-pay

## 2017-11-23 ENCOUNTER — Encounter: Payer: Self-pay | Admitting: Cardiovascular Disease

## 2017-11-23 ENCOUNTER — Ambulatory Visit: Payer: Managed Care, Other (non HMO) | Admitting: Cardiovascular Disease

## 2017-11-23 ENCOUNTER — Encounter (INDEPENDENT_AMBULATORY_CARE_PROVIDER_SITE_OTHER): Payer: Self-pay

## 2017-11-23 VITALS — BP 128/82 | HR 75 | Ht 60.0 in | Wt 180.0 lb

## 2017-11-23 DIAGNOSIS — I10 Essential (primary) hypertension: Secondary | ICD-10-CM

## 2017-11-23 DIAGNOSIS — Z951 Presence of aortocoronary bypass graft: Secondary | ICD-10-CM | POA: Diagnosis not present

## 2017-11-23 DIAGNOSIS — E785 Hyperlipidemia, unspecified: Secondary | ICD-10-CM

## 2017-11-23 LAB — HEPATIC FUNCTION PANEL
ALK PHOS: 72 IU/L (ref 39–117)
ALT: 14 IU/L (ref 0–32)
AST: 16 IU/L (ref 0–40)
Albumin: 4.5 g/dL (ref 3.5–5.5)
Bilirubin Total: 0.4 mg/dL (ref 0.0–1.2)
Bilirubin, Direct: 0.1 mg/dL (ref 0.00–0.40)
Total Protein: 8 g/dL (ref 6.0–8.5)

## 2017-11-23 LAB — LIPID PANEL
Chol/HDL Ratio: 4.3 ratio (ref 0.0–4.4)
Cholesterol, Total: 156 mg/dL (ref 100–199)
HDL: 36 mg/dL — AB (ref >39)
LDL Calculated: 92 mg/dL (ref 0–99)
TRIGLYCERIDES: 140 mg/dL (ref 0–149)
VLDL Cholesterol Cal: 28 mg/dL (ref 5–40)

## 2017-11-23 NOTE — Assessment & Plan Note (Signed)
History of dyslipidemia on atorvastatin 40 mg a day with recent lipid profile performed 07/28/17 revealing total cholesterol of 237 with an LDL of 167. We will recheck a lipid and liver profile today

## 2017-11-23 NOTE — Progress Notes (Signed)
11/23/2017 [name] [name]   11/24/1965  [id number]  Primary Physician Seward Carol, MD Primary Cardiologist: Lorretta Harp MD [name], [place]  HPI:  [name] [name] is a 52 y.o.   moderately overweight married African-American female mother of one 48 year old daughter who I last saw in the office 09/17/17. I met during her hospitalization on 07/29/17 when she was admitted with a non-STEMI. She required recatheterization that day by Dr. Martinique revealing three-vessel disease with normal LV function and bypass grafting the following day by Dr. Roxan Hockey is a RIMA to the PDA, vein to the second diagonal branch and sequential radial to OM 1 and 2. Her postoperative course is uncooperative. She was discharged on 08/06/17. She was participating cardiac rehabilitation however this was placed on hold because of blood pressure issues. She currently denies chest pain or shortness of breath. Her blood pressures under better control now.   Current Meds  Medication Sig  . amLODipine (NORVASC) 10 MG tablet Take 1 tablet (10 mg total) by mouth daily.  Marland Kitchen aspirin 325 MG EC tablet TAKE 1 TABLET BY MOUTH EVERY DAY  . glyBURIDE (DIABETA) 5 MG tablet Take 5 mg by mouth daily.  . metFORMIN (GLUCOPHAGE) 500 MG tablet Take 1,000 mg by mouth 2 (two) times daily.   . metoprolol tartrate (LOPRESSOR) 100 MG tablet Take 1 tablet (100 mg total) by mouth 2 (two) times daily.  . valsartan (DIOVAN) 160 MG tablet Take 1 tablet (160 mg total) by mouth 2 (two) times daily.     Allergies  Allergen Reactions  . Penicillins Hives    Social History   Socioeconomic History  . Marital status: Married    Spouse name: Not on file  . Number of children: Not on file  . Years of education: Not on file  . Highest education level: Not on file  Social Needs  . Financial resource strain: Not on file  . Food insecurity - worry: Not on file  . Food insecurity - inability: Not on file  . Transportation needs -  medical: Not on file  . Transportation needs - non-medical: Not on file  Occupational History  . Not on file  Tobacco Use  . Smoking status: Never Smoker  . Smokeless tobacco: Never Used  Substance and Sexual Activity  . Alcohol use: Yes  . Drug use: No  . Sexual activity: Not on file  Other Topics Concern  . Not on file  Social History Narrative  . Not on file     Review of Systems: General: negative for chills, fever, night sweats or weight changes.  Cardiovascular: negative for chest pain, dyspnea on exertion, edema, orthopnea, palpitations, paroxysmal nocturnal dyspnea or shortness of breath Dermatological: negative for rash Respiratory: negative for cough or wheezing Urologic: negative for hematuria Abdominal: negative for nausea, vomiting, diarrhea, bright red blood per rectum, melena, or hematemesis Neurologic: negative for visual changes, syncope, or dizziness All other systems reviewed and are otherwise negative except as noted above.    Blood pressure 128/82, pulse 75, height 5' (1.524 m), weight 180 lb (81.6 kg).  General appearance: alert and no distress Neck: no adenopathy, no carotid bruit, no JVD, supple, symmetrical, trachea midline and thyroid not enlarged, symmetric, no tenderness/mass/nodules Lungs: clear to auscultation bilaterally Heart: regular rate and rhythm, S1, S2 normal, no murmur, click, rub or gallop Extremities: extremities normal, atraumatic, no cyanosis or edema Pulses: 2+ and symmetric Skin: Skin color, texture, turgor normal. No rashes or  lesions Neurologic: Alert and oriented X 3, normal strength and tone. Normal symmetric reflexes. Normal coordination and gait  EKG not performed today  ASSESSMENT AND PLAN:   Dyslipidemia History of dyslipidemia on atorvastatin 40 mg a day with recent lipid profile performed 07/28/17 revealing total cholesterol of 237 with an LDL of 167. We will recheck a lipid and liver profile today  Essential  hypertension History of essential hypertension blood pressure measured at 120/82. She is on amlodipine, metoprolol and valsartan. Continue current meds at current dosing.  S/P CABG x 4 History of CAD status post non-STEMI 07/29/17 subsequent cardiac catheterization performed by Dr. Martinique revealing three-vessel disease with normal LV function. She underwent coronary artery bypass grafting the following day by Dr. Roxan Hockey with a RIMA graft to the PDA, vein to the second diagonal branch and sequential radial to OM 1 and 2. Her postop course was unremarkable. She denies chest pain or shortness of breath.      Lorretta Harp MD FACP,FACC,FAHA, [facility] 11/23/2017 8:31 AM

## 2017-11-23 NOTE — Assessment & Plan Note (Signed)
History of CAD status post non-STEMI 07/29/17 subsequent cardiac catheterization performed by Dr. SwazilandJordan revealing three-vessel disease with normal LV function. She underwent coronary artery bypass grafting the following day by Dr. Dorris FetchHendrickson with a RIMA graft to the PDA, vein to the second diagonal branch and sequential radial to OM 1 and 2. Her postop course was unremarkable. She denies chest pain or shortness of breath.

## 2017-11-23 NOTE — Patient Instructions (Signed)
Medication Instructions: Your physician recommends that you continue on your current medications as directed. Please refer to the Current Medication list given to you today.  Labwork: Your physician recommends that you return for a FASTING lipid profile and hepatic function panel today.   Follow-Up: We request that you follow-up in: 6 months with an extender and in 12 months with Dr San MorelleBerry  You will receive a reminder letter in the mail two months in advance. If you don't receive a letter, please call our office to schedule the follow-up appointment.  If you need a refill on your cardiac medications before your next appointment, please call your pharmacy.

## 2017-11-23 NOTE — Assessment & Plan Note (Signed)
History of essential hypertension blood pressure measured at 120/82. She is on amlodipine, metoprolol and valsartan. Continue current meds at current dosing.

## 2017-11-24 ENCOUNTER — Encounter (HOSPITAL_COMMUNITY): Payer: 59

## 2017-11-26 ENCOUNTER — Encounter (HOSPITAL_COMMUNITY): Payer: 59

## 2017-12-10 ENCOUNTER — Other Ambulatory Visit: Payer: Self-pay | Admitting: Cardiovascular Disease

## 2017-12-10 NOTE — Telephone Encounter (Signed)
REFILL 

## 2018-02-10 ENCOUNTER — Other Ambulatory Visit: Payer: Self-pay | Admitting: Cardiovascular Disease

## 2018-02-15 ENCOUNTER — Other Ambulatory Visit: Payer: Self-pay | Admitting: Cardiovascular Disease

## 2018-02-15 NOTE — Telephone Encounter (Signed)
Rx sent to pharmacy   

## 2018-04-01 ENCOUNTER — Other Ambulatory Visit: Payer: 59

## 2018-04-05 ENCOUNTER — Ambulatory Visit: Payer: 59 | Admitting: Endocrinology

## 2018-05-19 ENCOUNTER — Other Ambulatory Visit: Payer: Managed Care, Other (non HMO)

## 2018-05-23 ENCOUNTER — Other Ambulatory Visit (INDEPENDENT_AMBULATORY_CARE_PROVIDER_SITE_OTHER): Payer: Managed Care, Other (non HMO)

## 2018-05-23 DIAGNOSIS — E559 Vitamin D deficiency, unspecified: Secondary | ICD-10-CM | POA: Diagnosis not present

## 2018-05-23 DIAGNOSIS — E213 Hyperparathyroidism, unspecified: Secondary | ICD-10-CM | POA: Diagnosis not present

## 2018-05-23 LAB — COMPREHENSIVE METABOLIC PANEL
ALBUMIN: 4.5 g/dL (ref 3.5–5.2)
ALK PHOS: 57 U/L (ref 39–117)
ALT: 20 U/L (ref 0–35)
AST: 18 U/L (ref 0–37)
BILIRUBIN TOTAL: 0.4 mg/dL (ref 0.2–1.2)
BUN: 14 mg/dL (ref 6–23)
CALCIUM: 10.9 mg/dL — AB (ref 8.4–10.5)
CO2: 26 meq/L (ref 19–32)
CREATININE: 0.69 mg/dL (ref 0.40–1.20)
Chloride: 102 mEq/L (ref 96–112)
GFR: 114.62 mL/min (ref >60.00)
Glucose, Bld: 208 mg/dL — ABNORMAL HIGH (ref 70–99)
Potassium: 4 mEq/L (ref 3.5–5.1)
Sodium: 136 mEq/L (ref 135–145)
TOTAL PROTEIN: 8.2 g/dL (ref 6.0–8.3)

## 2018-05-23 LAB — VITAMIN D 25 HYDROXY (VIT D DEFICIENCY, FRACTURES): VITD: 11.46 ng/mL — ABNORMAL LOW (ref 30.00–100.00)

## 2018-05-24 ENCOUNTER — Ambulatory Visit: Payer: Managed Care, Other (non HMO) | Admitting: Endocrinology

## 2018-05-24 ENCOUNTER — Encounter: Payer: Self-pay | Admitting: Endocrinology

## 2018-05-24 DIAGNOSIS — E559 Vitamin D deficiency, unspecified: Secondary | ICD-10-CM

## 2018-05-24 LAB — PARATHYROID HORMONE, INTACT (NO CA): PTH: 32 pg/mL (ref 15–65)

## 2018-05-24 NOTE — Patient Instructions (Addendum)
Take 5000 units at supper Vitamin D3

## 2018-05-24 NOTE — Progress Notes (Signed)
Patient ID: [name], female   DOB: [birth year], 52 y.o.   MRN: [id number]            Chief complaint: Endocrinology follow-up  Referring physician: Dr. Nehemiah Settle   History of Present Illness:  Referring physician:  HYPERCALCEMIA: Review of records show that she had a high calcium done in October 2017 The patient thinks that she has had higher calcium before also but no records are available Apparently at some point she was taking HCTZ and this was stopped with some improvement in calcium She says that she has been having hot flashes for up to 10 years, did have partial hysterectomy previously  Calcium level on 08/06/16 was 11.2, corrected calcium was 10.5, normal up to 10.3 PTH level from unknown lab 29 done in 10/17 and subsequently 32  Most recent calcium is 10.9, previously 11.1   Lab Results  Component Value Date   CALCIUM 10.9 (H) 05/23/2018   CALCIUM 11.1 (H) 10/01/2017   CALCIUM 9.3 08/03/2017   CALCIUM 9.1 08/02/2017   CALCIUM 9.2 08/01/2017   CALCIUM 9.4 07/31/2017   CALCIUM 10.0 07/30/2017   CALCIUM 9.9 07/29/2017   CALCIUM 10.8 (H) 07/28/2017    She has been on vitamin D supplements 2000 units vitamin D3 even though she was recommended 5000 units She does not take any calcium and she does not drink any milk  Prior serologic and radiologic studies have included:  No evidence of osteoporosis on her bone density, done in 09/2017   Lab Results  Component Value Date   PTH 32 05/23/2018   CALCIUM 10.9 (H) 05/23/2018   CAION 1.31 07/31/2017   PHOS 3.2 10/01/2017      25 (OH) Vitamin D level history:  Lab Results  Component Value Date   VD25OH 11.46 (L) 05/23/2018   VD25OH 12.55 (L) 10/01/2017   VD25OH 14.58 (L) 10/05/2016        Allergies as of 05/24/2018      Reactions   Penicillins Hives      Medication List        Accurate as of 05/24/18 10:32 AM. Always use your most recent med list.          amLODipine 10 MG tablet Commonly known  as:  NORVASC TAKE 1 TABLET BY MOUTH EVERY DAY   aspirin 325 MG EC tablet TAKE 1 TABLET BY MOUTH EVERY DAY   atorvastatin 40 MG tablet Commonly known as:  LIPITOR Take 1 tablet (40 mg total) by mouth daily.   cholecalciferol 1000 units tablet Commonly known as:  VITAMIN D Take 2,000 Units by mouth daily.   glyBURIDE 5 MG tablet Commonly known as:  DIABETA Take 5 mg by mouth daily.   metFORMIN 500 MG tablet Commonly known as:  GLUCOPHAGE Take 1,000 mg by mouth 2 (two) times daily.   metoprolol tartrate 100 MG tablet Commonly known as:  LOPRESSOR TAKE 1 TABLET BY MOUTH TWICE A DAY   valsartan 160 MG tablet Commonly known as:  DIOVAN TAKE 1 TABLET BY MOUTH 2 TIMES DAILY       Allergies:  Allergies  Allergen Reactions  . Penicillins Hives    Past Medical History:  Diagnosis Date  . Coronary artery disease   . Diabetes mellitus without complication (HCC)   . Hyperlipidemia   . Hypertension   . Obesity     Past Surgical History:  Procedure Laterality Date  . CARDIAC CATHETERIZATION    . CESAREAN SECTION    .  CORONARY ARTERY BYPASS GRAFT N/A 07/30/2017   Procedure: CORONARY ARTERY BYPASS GRAFTING (CABG)  x four, using right internal mammary artery, right greater saphenous vein, left radial artery;  Surgeon: Loreli SlotHendrickson, Steven C, MD;  Location: [facility] OR;  Service: Open Heart Surgery;  Laterality: N/A;  . INTRAOPERATIVE TRANSESOPHAGEAL ECHOCARDIOGRAM N/A 07/30/2017   Procedure: INTRAOPERATIVE TRANSESOPHAGEAL ECHOCARDIOGRAM;  Surgeon: Loreli SlotHendrickson, Steven C, MD;  [facility];  Service: Open Heart Surgery;  Laterality: N/A;  . LEFT HEART CATH AND CORONARY ANGIOGRAPHY N/A 07/29/2017   Procedure: LEFT HEART CATH AND CORONARY ANGIOGRAPHY;  Surgeon: SwazilandJordan, Peter M, MD;  [facility];  Service: Cardiovascular;  Laterality: N/A;  . PARTIAL HYSTERECTOMY  2005  . RADIAL ARTERY HARVEST Left 07/30/2017   Procedure: RADIAL ARTERY HARVEST;  Surgeon: Loreli SlotHendrickson,  Steven C, MD;  [facility];  Service: Open Heart Surgery;  Laterality: Left;    Family History  Problem Relation Age of Onset  . Hypertension Other     Social History:  reports that she has never smoked. She has never used smokeless tobacco. She reports that she drinks alcohol. She reports that she does not use drugs.  Review of Systems  She is followed by her PCP for diabetes and is scheduled for follow-up  EXAM:  BP 138/90   Pulse 87   Ht 5' (1.524 m)   Wt 197 lb 6.4 oz (89.5 kg)   SpO2 98%   BMI 38.55 kg/m     Assessment/Plan:   HYPERCALCEMIA:  Patient has had mild hyperparathyroidism since at least 2017  Her calcium is leveled off now and stable at 10.9 Also has no symptoms from this or endorgan problems such as osteoporosis or nephrolithiasis  Calcium can be continued to be monitored, no intervention needed  VITAMIN D deficiency: Currently not on inadequate treatment with her level still very low and below 20 She is taking only 2000 units of vitamin D3 and may not take this with food also Also not clear if because of her ethnic status is having a relatively low vitamin D binding protein  Since she is significantly deficient she will start 5000 units of vitamin D3 daily with her main meal once a day  Follow-up in 6 months   [name] [name] 05/24/2018, 10:32 AM     Note: This office note was prepared with Dragon voice recognition system technology. Any transcriptional errors that result from this process are unintentional.

## 2018-07-03 ENCOUNTER — Other Ambulatory Visit: Payer: Self-pay | Admitting: Cardiovascular Disease

## 2018-07-14 ENCOUNTER — Ambulatory Visit: Payer: Managed Care, Other (non HMO) | Admitting: Cardiology

## 2018-07-14 ENCOUNTER — Encounter: Payer: Self-pay | Admitting: Cardiology

## 2018-07-14 VITALS — BP 134/94 | HR 74 | Ht 61.0 in | Wt 197.0 lb

## 2018-07-14 DIAGNOSIS — E785 Hyperlipidemia, unspecified: Secondary | ICD-10-CM | POA: Diagnosis not present

## 2018-07-14 DIAGNOSIS — E119 Type 2 diabetes mellitus without complications: Secondary | ICD-10-CM

## 2018-07-14 DIAGNOSIS — R079 Chest pain, unspecified: Secondary | ICD-10-CM | POA: Diagnosis not present

## 2018-07-14 DIAGNOSIS — I10 Essential (primary) hypertension: Secondary | ICD-10-CM | POA: Diagnosis not present

## 2018-07-14 DIAGNOSIS — E669 Obesity, unspecified: Secondary | ICD-10-CM | POA: Insufficient documentation

## 2018-07-14 DIAGNOSIS — Z951 Presence of aortocoronary bypass graft: Secondary | ICD-10-CM | POA: Diagnosis not present

## 2018-07-14 NOTE — Assessment & Plan Note (Signed)
BMI 37  

## 2018-07-14 NOTE — Patient Instructions (Signed)
Medication Instructions:  Your physician recommends that you continue on your current medications as directed. Please refer to the Current Medication list given to you today.  If you need a refill on your cardiac medications before your next appointment, please call your pharmacy.  Labwork: None   Testing/Procedures: Your physician has requested that you have an exercise stress myoview. For further information please visit [email or web address] Please follow instruction sheet, as given.  Follow-Up: Your physician recommends that you schedule a follow-up appointment in: 3-4 weeks with Corine Shelter, PA-C  Any Other Special Instructions Will Be Listed Below (If Applicable).

## 2018-07-14 NOTE — Assessment & Plan Note (Signed)
Presented with NSTEMI 07/28/17-cath showed severe CAD with normal LVF CABG x 4 with RIMA-PDA, LRA-OM1-OM2, SVG-Dx2 

## 2018-07-14 NOTE — Assessment & Plan Note (Signed)
LDL came down to 90 on Lipitor 40 mg

## 2018-07-14 NOTE — Progress Notes (Signed)
  07/14/2018 [name] [name]   12/18/1965  [id number]  Primary Physician Polite, Ronald, MD Primary Cardiologist: Dr [name]  HPI:  51 y/o overweight AA female with a history of NIDDM, HTN, and HLD who presented with a NSTEMI 07/28/17. Cath showed severe CAD with normal LVF. It was decided to proceed with CABG x4 which was done 07/30/17. She tolerated this well and was discharged 08/06/17. She has done well post op. Her lipid therapy was adjusted in Nov 2018 and her LDL improved from 167 to 90. She is in the office today for a 6 month check. She related a history of intermittent Lt arm discomfort, worse when she is carrying something. She also complains of vague SSCP- had for her to describe.    Current Outpatient Medications  Medication Sig Dispense Refill  . amLODipine (NORVASC) 10 MG tablet TAKE 1 TABLET BY MOUTH EVERY DAY 30 tablet 6  . aspirin 325 MG EC tablet TAKE 1 TABLET BY MOUTH EVERY DAY 30 tablet 6  . cholecalciferol (VITAMIN D) 1000 units tablet Take 2,000 Units by mouth daily.    . glyBURIDE (DIABETA) 5 MG tablet Take 5 mg by mouth daily.    . metFORMIN (GLUCOPHAGE) 500 MG tablet Take 1,000 mg by mouth 2 (two) times daily.     . metoprolol tartrate (LOPRESSOR) 100 MG tablet TAKE 1 TABLET BY MOUTH TWICE A DAY 180 tablet 1  . valsartan (DIOVAN) 160 MG tablet TAKE 1 TABLET BY MOUTH 2 TIMES DAILY 60 tablet 11  . atorvastatin (LIPITOR) 40 MG tablet Take 1 tablet (40 mg total) by mouth daily. 90 tablet 3   No current facility-administered medications for this visit.     Allergies  Allergen Reactions  . Penicillins Hives    Past Medical History:  Diagnosis Date  . Coronary artery disease   . Diabetes mellitus without complication (HCC)   . Hyperlipidemia   . Hypertension   . Obesity     Social History   Socioeconomic History  . Marital status: Married    Spouse name: Not on file  . Number of children: Not on file  . Years of education: Not on file  . Highest  education level: Not on file  Occupational History  . Not on file  Social Needs  . Financial resource strain: Not on file  . Food insecurity:    Worry: Not on file    Inability: Not on file  . Transportation needs:    Medical: Not on file    Non-medical: Not on file  Tobacco Use  . Smoking status: Never Smoker  . Smokeless tobacco: Never Used  Substance and Sexual Activity  . Alcohol use: Yes  . Drug use: No  . Sexual activity: Not on file  Lifestyle  . Physical activity:    Days per week: 4 days    Minutes per session: 30 min  . Stress: Rather much  Relationships  . Social connections:    Talks on phone: Not on file    Gets together: Not on file    Attends religious service: Not on file    Active member of club or organization: Not on file    Attends meetings of clubs or organizations: Not on file    Relationship status: Not on file  . Intimate partner violence:    Fear of current or ex partner: Not on file    Emotionally abused: Not on file    Physically abused: Not on file      Forced sexual activity: Not on file  Other Topics Concern  . Not on file  Social History Narrative  . Not on file     Family History  Problem Relation Age of Onset  . Hypertension Other      Review of Systems: General: negative for chills, fever, night sweats or weight changes.  Cardiovascular: negative for chest pain, dyspnea on exertion, edema, orthopnea, palpitations, paroxysmal nocturnal dyspnea or shortness of breath Dermatological: negative for rash Respiratory: negative for cough or wheezing Urologic: negative for hematuria Abdominal: negative for nausea, vomiting, diarrhea, bright red blood per rectum, melena, or hematemesis Neurologic: negative for visual changes, syncope, or dizziness All other systems reviewed and are otherwise negative except as noted above.    Blood pressure (!) 134/94, pulse 74, height 5' 1" (1.549 m), weight 197 lb (89.4 kg).  General appearance:  alert, cooperative, no distress and moderately obese Neck: no carotid bruit and no JVD Lungs: clear to auscultation bilaterally Heart: regular rate and rhythm Extremities: no edema Skin: warm and dry Neurologic: Grossly normal  EKG NSR, NSST changes  ASSESSMENT AND PLAN:   Chest pain with moderate risk for cardiac etiology Check GXT Myoview  S/P CABG x 4 Presented with NSTEMI 07/28/17-cath showed severe CAD with normal LVF CABG x 4 with RIMA-PDA, LRA-OM1-OM2, SVG-Dx2  Essential hypertension Fair control  Dyslipidemia LDL came down to 90 on Lipitor 40 mg  Diabetes mellitus type II, non insulin dependent (HCC) On oral agents  Obesity (BMI 30-39.9) BMI 37   PLAN  Check GXT Myoview- OK to hod beta blocker for this. Consider increasing her Lipitor at next OV.   Larenzo Caples PA-C 07/14/2018 4:19 PM 

## 2018-07-14 NOTE — Assessment & Plan Note (Signed)
Check GXT Myoview

## 2018-07-14 NOTE — Assessment & Plan Note (Signed)
On oral agents 

## 2018-07-14 NOTE — Assessment & Plan Note (Signed)
Fair control.

## 2018-07-14 NOTE — H&P (View-Only) (Signed)
07/14/2018 [name] [name]   [birth year]  [id number]  Primary Physician Renford Dills, MD Primary Cardiologist: Dr Allyson Sabal  HPI:  52 y/o overweight AA female with a history of NIDDM, HTN, and HLD who presented with a NSTEMI 07/28/17. Cath showed severe CAD with normal LVF. It was decided to proceed with CABG x4 which was done 07/30/17. She tolerated this well and was discharged 08/06/17. She has done well post op. Her lipid therapy was adjusted in Nov 2018 and her LDL improved from 167 to 90. She is in the office today for a 6 month check. She related a history of intermittent Lt arm discomfort, worse when she is carrying something. She also complains of vague SSCP- had for her to describe.    Current Outpatient Medications  Medication Sig Dispense Refill  . amLODipine (NORVASC) 10 MG tablet TAKE 1 TABLET BY MOUTH EVERY DAY 30 tablet 6  . aspirin 325 MG EC tablet TAKE 1 TABLET BY MOUTH EVERY DAY 30 tablet 6  . cholecalciferol (VITAMIN D) 1000 units tablet Take 2,000 Units by mouth daily.    Marland Kitchen glyBURIDE (DIABETA) 5 MG tablet Take 5 mg by mouth daily.    . metFORMIN (GLUCOPHAGE) 500 MG tablet Take 1,000 mg by mouth 2 (two) times daily.     . metoprolol tartrate (LOPRESSOR) 100 MG tablet TAKE 1 TABLET BY MOUTH TWICE A DAY 180 tablet 1  . valsartan (DIOVAN) 160 MG tablet TAKE 1 TABLET BY MOUTH 2 TIMES DAILY 60 tablet 11  . atorvastatin (LIPITOR) 40 MG tablet Take 1 tablet (40 mg total) by mouth daily. 90 tablet 3   No current facility-administered medications for this visit.     Allergies  Allergen Reactions  . Penicillins Hives    Past Medical History:  Diagnosis Date  . Coronary artery disease   . Diabetes mellitus without complication (HCC)   . Hyperlipidemia   . Hypertension   . Obesity     Social History   Socioeconomic History  . Marital status: Married    Spouse name: Not on file  . Number of children: Not on file  . Years of education: Not on file  . Highest  education level: Not on file  Occupational History  . Not on file  Social Needs  . Financial resource strain: Not on file  . Food insecurity:    Worry: Not on file    Inability: Not on file  . Transportation needs:    Medical: Not on file    Non-medical: Not on file  Tobacco Use  . Smoking status: Never Smoker  . Smokeless tobacco: Never Used  Substance and Sexual Activity  . Alcohol use: Yes  . Drug use: No  . Sexual activity: Not on file  Lifestyle  . Physical activity:    Days per week: 4 days    Minutes per session: 30 min  . Stress: Rather much  Relationships  . Social connections:    Talks on phone: Not on file    Gets together: Not on file    Attends religious service: Not on file    Active member of club or organization: Not on file    Attends meetings of clubs or organizations: Not on file    Relationship status: Not on file  . Intimate partner violence:    Fear of current or ex partner: Not on file    Emotionally abused: Not on file    Physically abused: Not on file  Forced sexual activity: Not on file  Other Topics Concern  . Not on file  Social History Narrative  . Not on file     Family History  Problem Relation Age of Onset  . Hypertension Other      Review of Systems: General: negative for chills, fever, night sweats or weight changes.  Cardiovascular: negative for chest pain, dyspnea on exertion, edema, orthopnea, palpitations, paroxysmal nocturnal dyspnea or shortness of breath Dermatological: negative for rash Respiratory: negative for cough or wheezing Urologic: negative for hematuria Abdominal: negative for nausea, vomiting, diarrhea, bright red blood per rectum, melena, or hematemesis Neurologic: negative for visual changes, syncope, or dizziness All other systems reviewed and are otherwise negative except as noted above.    Blood pressure (!) 134/94, pulse 74, height 5\' 1"  (1.549 m), weight 197 lb (89.4 kg).  General appearance:  alert, cooperative, no distress and moderately obese Neck: no carotid bruit and no JVD Lungs: clear to auscultation bilaterally Heart: regular rate and rhythm Extremities: no edema Skin: warm and dry Neurologic: Grossly normal  EKG NSR, NSST changes  ASSESSMENT AND PLAN:   Chest pain with moderate risk for cardiac etiology Check GXT Myoview  S/P CABG x 4 Presented with NSTEMI 07/28/17-cath showed severe CAD with normal LVF CABG x 4 with RIMA-PDA, LRA-OM1-OM2, SVG-Dx2  Essential hypertension Fair control  Dyslipidemia LDL came down to 90 on Lipitor 40 mg  Diabetes mellitus type II, non insulin dependent (HCC) On oral agents  Obesity (BMI 30-39.9) BMI 37   PLAN  Check GXT Myoview- OK to hod beta blocker for this. Consider increasing her Lipitor at next OV.   Corine Shelter PA-C 07/14/2018 4:19 PM

## 2018-07-15 ENCOUNTER — Telehealth (HOSPITAL_COMMUNITY): Payer: Self-pay

## 2018-07-15 NOTE — Telephone Encounter (Signed)
Encounter complete. 

## 2018-07-20 ENCOUNTER — Ambulatory Visit (HOSPITAL_COMMUNITY)
Admission: RE | Admit: 2018-07-20 | Discharge: 2018-07-20 | Disposition: A | Discharge status: Home / Self Care | Payer: Managed Care, Other (non HMO) | Source: Ambulatory Visit | Attending: Internal Medicine | Admitting: Internal Medicine

## 2018-07-20 DIAGNOSIS — R079 Chest pain, unspecified: Secondary | ICD-10-CM | POA: Insufficient documentation

## 2018-07-20 LAB — MYOCARDIAL PERFUSION IMAGING
Estimated workload: 6 METS
Exercise duration (min): 5 min
Exercise duration (sec): 0 s
LV dias vol: 63 mL (ref 46–106)
LV sys vol: 31 mL
MPHR: 168 {beats}/min
Peak HR: 166 {beats}/min
Percent HR: 98 %
RPE: 18
Rest HR: 93 {beats}/min
SDS: 6
SRS: 1
SSS: 7
TID: 1.33

## 2018-07-20 MED ORDER — TECHNETIUM TC 99M TETROFOSMIN IV KIT
9.9000 | PACK | Freq: Once | INTRAVENOUS | Status: AC | PRN
  Administered 2018-07-20: 9.9 via INTRAVENOUS
  Filled 2018-07-20: qty 10

## 2018-07-20 MED ORDER — TECHNETIUM TC 99M TETROFOSMIN IV KIT
30.8000 | PACK | Freq: Once | INTRAVENOUS | Status: AC | PRN
  Administered 2018-07-20: 30.8 via INTRAVENOUS
  Filled 2018-07-20: qty 31

## 2018-07-21 ENCOUNTER — Telehealth: Payer: Self-pay | Admitting: Cardiology

## 2018-07-21 DIAGNOSIS — R9439 Abnormal result of other cardiovascular function study: Secondary | ICD-10-CM

## 2018-07-21 NOTE — Progress Notes (Signed)
[facility] CARDIOVASCULAR DIVISION [facility] [address] [place] [address] [place] [address] Dept: [phone] Loc: [phone]  [name] [name]  07/21/2018  You are scheduled for a Cardiac Catheterization on Monday, October 7 with Dr. Nanetta Batty.  1. Please arrive at the [facility] (Main Entrance A) at [facility]: [address] [place], [place] [address] at 9:00 AM (This time is two hours before your procedure to ensure your preparation). Free valet parking service is available.   Special note: Every effort is made to have your procedure done on time. Please understand that emergencies sometimes delay scheduled procedures.  2. Diet: Do not eat solid foods after midnight.  The patient may have clear liquids until 5am upon the day of the procedure.  3. Labs: You will need to have blood drawn on Friday, October 4 at [facility] [address], [place]  Open: 8am - 5pm (Lunch 12:30 - 1:30)   Phone: [phone]. You do not need to be fasting.  4. Medication instructions in preparation for your procedure:  Stop Taking PO Diabetes Meds Glucophage (Metformin)on Sunday, October 6.  On the morning of your procedure, take your Aspirin and any morning medicines NOT listed above.  You may use sips of water.  5. Plan for one night stay--bring personal belongings. 6. Bring a current list of your medications and current insurance cards. 7. You MUST have a responsible person to drive you home. 8. Someone MUST be with you the first 24 hours after you arrive home or your discharge will be delayed. 9. Please wear clothes that are easy to get on and off and wear slip-on shoes.  Thank you for allowing [name] to care for you!   -- [facility]

## 2018-07-21 NOTE — Telephone Encounter (Signed)
Pt called back to discuss Myoview results. I explained that her results were not normal and that after discussion with Dr Allyson Sabal it would be best to proceed with diagnostic catheterization.   The patient understands that risks included but are not limited to stroke (1 in 1000), death (1 in 1000), kidney failure [usually temporary] (1 in 500), bleeding (1 in 200), allergic reaction [possibly serious] (1 in 200).  The patient understands and agrees to proceed.   Corine Shelter PA-C 07/21/2018 4:11 PM

## 2018-07-21 NOTE — Telephone Encounter (Signed)
[facility] CARDIOVASCULAR DIVISION [facility] [address] [place] [address] Dept: [phone] Loc: [phone]  [name] [name]  07/21/2018  You are scheduled for a Cardiac Catheterization on Monday, October 7 with Dr. [name] [name].  1. Please arrive at the [facility] (Main Entrance A) at [facility]: [address] [place], [place] [address] at 9:00 AM (This time is two hours before your procedure to ensure your preparation). Free valet parking service is available.   Special note: Every effort is made to have your procedure done on time. Please understand that emergencies sometimes delay scheduled procedures.  2. Diet: Do not eat solid foods after midnight.  The patient may have clear liquids until 5am upon the day of the procedure.  3. Labs: You will need to have blood drawn on Friday, October 4 at [facility] [address], [name]  Open: 8am - 5pm (Lunch 12:30 - 1:30)   Phone: [phone]. You do not need to be fasting.  4. Medication instructions in preparation for your procedure:  Stop Taking PO Diabetes Meds Glucophage (Metformin)on Sunday, October 6.  On the morning of your procedure, take your Aspirin and any morning medicines NOT listed above.  You may use sips of water.  5. Plan for one night stay--bring personal belongings. 6. Bring a current list of your medications and current insurance cards. 7. You MUST have a responsible person to drive you home. 8. Someone MUST be with you the first 24 hours after you arrive home or your discharge will be delayed. 9. Please wear clothes that are easy to get on and off and wear slip-on shoes.  Thank you for allowing us to care for you!   -- [facility]  

## 2018-07-21 NOTE — Addendum Note (Signed)
Addended by: Alyson Ingles on: 07/21/2018 04:01 PM   Modules accepted: Orders

## 2018-07-21 NOTE — Telephone Encounter (Signed)
LMTCB  Lastacia Solum PA-C 07/21/2018 2:27 PM

## 2018-07-23 LAB — CBC
Hematocrit: 39.7 % (ref 34.0–46.6)
Hemoglobin: 12.8 g/dL (ref 11.1–15.9)
MCH: 25.3 pg — ABNORMAL LOW (ref 26.6–33.0)
MCHC: 32.2 g/dL (ref 31.5–35.7)
MCV: 79 fL (ref 79–97)
Platelets: 362 10*3/uL (ref 150–450)
RBC: 5.06 x10E6/uL (ref 3.77–5.28)
RDW: 14 % (ref 12.3–15.4)
WBC: 10.3 10*3/uL (ref 3.4–10.8)

## 2018-07-23 LAB — BASIC METABOLIC PANEL
BUN/Creatinine Ratio: 20 (ref 9–23)
BUN: 15 mg/dL (ref 6–24)
CO2: 24 mmol/L (ref 20–29)
Calcium: 11.3 mg/dL — ABNORMAL HIGH (ref 8.7–10.2)
Chloride: 96 mmol/L (ref 96–106)
Creatinine, Ser: 0.76 mg/dL (ref 0.57–1.00)
GFR calc Af Amer: 104 mL/min/{1.73_m2} (ref >59)
GFR calc non Af Amer: 90 mL/min/{1.73_m2} (ref >59)
Glucose: 102 mg/dL — ABNORMAL HIGH (ref 65–99)
Potassium: 4.5 mmol/L (ref 3.5–5.2)
Sodium: 136 mmol/L (ref 134–144)

## 2018-07-23 LAB — TSH: TSH: 3.99 u[IU]/mL (ref 0.450–4.500)

## 2018-07-25 ENCOUNTER — Ambulatory Visit (HOSPITAL_COMMUNITY)
Admission: RE | Admit: 2018-07-25 | Discharge: 2018-07-25 | Disposition: A | Discharge status: Home / Self Care | Payer: Managed Care, Other (non HMO) | Source: Ambulatory Visit | Attending: Cardiovascular Disease | Admitting: Cardiovascular Disease

## 2018-07-25 ENCOUNTER — Encounter (HOSPITAL_COMMUNITY)
Admission: RE | Disposition: A | Discharge status: Home / Self Care | Payer: Self-pay | Source: Ambulatory Visit | Attending: Cardiovascular Disease

## 2018-07-25 ENCOUNTER — Encounter (HOSPITAL_COMMUNITY): Payer: Self-pay | Admitting: Cardiovascular Disease

## 2018-07-25 DIAGNOSIS — Z880 Allergy status to penicillin: Secondary | ICD-10-CM | POA: Insufficient documentation

## 2018-07-25 DIAGNOSIS — E785 Hyperlipidemia, unspecified: Secondary | ICD-10-CM | POA: Insufficient documentation

## 2018-07-25 DIAGNOSIS — Z6837 Body mass index (BMI) 37.0-37.9, adult: Secondary | ICD-10-CM | POA: Diagnosis not present

## 2018-07-25 DIAGNOSIS — I252 Old myocardial infarction: Secondary | ICD-10-CM | POA: Diagnosis not present

## 2018-07-25 DIAGNOSIS — R9439 Abnormal result of other cardiovascular function study: Secondary | ICD-10-CM

## 2018-07-25 DIAGNOSIS — Z7982 Long term (current) use of aspirin: Secondary | ICD-10-CM | POA: Insufficient documentation

## 2018-07-25 DIAGNOSIS — Z794 Long term (current) use of insulin: Secondary | ICD-10-CM | POA: Insufficient documentation

## 2018-07-25 DIAGNOSIS — E669 Obesity, unspecified: Secondary | ICD-10-CM | POA: Insufficient documentation

## 2018-07-25 DIAGNOSIS — Z951 Presence of aortocoronary bypass graft: Secondary | ICD-10-CM

## 2018-07-25 DIAGNOSIS — I10 Essential (primary) hypertension: Secondary | ICD-10-CM | POA: Diagnosis not present

## 2018-07-25 DIAGNOSIS — I2510 Atherosclerotic heart disease of native coronary artery without angina pectoris: Secondary | ICD-10-CM | POA: Insufficient documentation

## 2018-07-25 DIAGNOSIS — E119 Type 2 diabetes mellitus without complications: Secondary | ICD-10-CM | POA: Insufficient documentation

## 2018-07-25 DIAGNOSIS — Z79899 Other long term (current) drug therapy: Secondary | ICD-10-CM | POA: Diagnosis not present

## 2018-07-25 DIAGNOSIS — Z8249 Family history of ischemic heart disease and other diseases of the circulatory system: Secondary | ICD-10-CM | POA: Insufficient documentation

## 2018-07-25 HISTORY — PX: LEFT HEART CATH AND CORS/GRAFTS ANGIOGRAPHY: CATH118250

## 2018-07-25 LAB — GLUCOSE, CAPILLARY
Glucose-Capillary: 175 mg/dL — ABNORMAL HIGH (ref 70–99)
Glucose-Capillary: 125 mg/dL — ABNORMAL HIGH (ref 70–99)

## 2018-07-25 SURGERY — LEFT HEART CATH AND CORS/GRAFTS ANGIOGRAPHY
Anesthesia: LOCAL

## 2018-07-25 MED ORDER — ACETAMINOPHEN 325 MG PO TABS: 650.0000 mg | ORAL_TABLET | ORAL | Status: DC | PRN

## 2018-07-25 MED ORDER — HEPARIN (PORCINE) IN NACL 1000-0.9 UT/500ML-% IV SOLN
INTRAVENOUS | Status: AC
  Filled 2018-07-25: qty 1000

## 2018-07-25 MED ORDER — FENTANYL CITRATE (PF) 100 MCG/2ML IJ SOLN
INTRAMUSCULAR | Status: DC | PRN
  Administered 2018-07-25 (×2): 25 ug via INTRAVENOUS

## 2018-07-25 MED ORDER — SODIUM CHLORIDE 0.9% FLUSH: 3.0000 mL | Freq: Two times a day (BID) | INTRAVENOUS | Status: DC

## 2018-07-25 MED ORDER — SODIUM CHLORIDE 0.9 % WEIGHT BASED INFUSION
3.0000 mL/kg/h | INTRAVENOUS | Status: AC
  Administered 2018-07-25: 3 mL/kg/h via INTRAVENOUS

## 2018-07-25 MED ORDER — ONDANSETRON HCL 4 MG/2ML IJ SOLN: 4.0000 mg | Freq: Four times a day (QID) | INTRAMUSCULAR | Status: DC | PRN

## 2018-07-25 MED ORDER — LIDOCAINE HCL (PF) 1 % IJ SOLN
INTRAMUSCULAR | Status: AC
  Filled 2018-07-25: qty 30

## 2018-07-25 MED ORDER — LIDOCAINE HCL (PF) 1 % IJ SOLN
INTRAMUSCULAR | Status: DC | PRN
  Administered 2018-07-25: 15 mL via INTRADERMAL

## 2018-07-25 MED ORDER — MORPHINE SULFATE (PF) 10 MG/ML IV SOLN: 2.0000 mg | INTRAVENOUS | Status: DC | PRN

## 2018-07-25 MED ORDER — SODIUM CHLORIDE 0.9% FLUSH: 3.0000 mL | INTRAVENOUS | Status: DC | PRN

## 2018-07-25 MED ORDER — IOHEXOL 350 MG/ML SOLN
INTRAVENOUS | Status: DC | PRN
  Administered 2018-07-25: 95 mL via INTRA_ARTERIAL

## 2018-07-25 MED ORDER — ASPIRIN 81 MG PO CHEW
81.0000 mg | CHEWABLE_TABLET | ORAL | Status: AC
  Administered 2018-07-25: 81 mg via ORAL
  Filled 2018-07-25: qty 1

## 2018-07-25 MED ORDER — ASPIRIN 81 MG PO CHEW: 81.0000 mg | CHEWABLE_TABLET | Freq: Every day | ORAL | Status: DC

## 2018-07-25 MED ORDER — FENTANYL CITRATE (PF) 100 MCG/2ML IJ SOLN
INTRAMUSCULAR | Status: AC
  Filled 2018-07-25: qty 2

## 2018-07-25 MED ORDER — SODIUM CHLORIDE 0.9 % IV SOLN: INTRAVENOUS | Status: DC

## 2018-07-25 MED ORDER — SODIUM CHLORIDE 0.9 % IV SOLN: 250.0000 mL | INTRAVENOUS | Status: DC | PRN

## 2018-07-25 MED ORDER — SODIUM CHLORIDE 0.9 % WEIGHT BASED INFUSION: 1.0000 mL/kg/h | INTRAVENOUS | Status: DC

## 2018-07-25 MED ORDER — MIDAZOLAM HCL 2 MG/2ML IJ SOLN
INTRAMUSCULAR | Status: AC
  Filled 2018-07-25: qty 2

## 2018-07-25 MED ORDER — HEPARIN (PORCINE) IN NACL 1000-0.9 UT/500ML-% IV SOLN
INTRAVENOUS | Status: DC | PRN
  Administered 2018-07-25 (×2): 500 mL

## 2018-07-25 MED ORDER — MIDAZOLAM HCL 2 MG/2ML IJ SOLN
INTRAMUSCULAR | Status: DC | PRN
  Administered 2018-07-25 (×2): 1 mg via INTRAVENOUS

## 2018-07-25 MED ORDER — ATORVASTATIN CALCIUM 40 MG PO TABS: 40.0000 mg | ORAL_TABLET | Freq: Every day | ORAL | Status: DC

## 2018-07-25 MED ORDER — ATORVASTATIN CALCIUM 80 MG PO TABS: 80.0000 mg | ORAL_TABLET | Freq: Every day | ORAL | Status: DC

## 2018-07-25 SURGICAL SUPPLY — 10 items
CATH INFINITI 5 FR LCB (CATHETERS) ×2 IMPLANT
CATH INFINITI 5FR MULTPACK ANG (CATHETERS) ×2 IMPLANT
KIT HEART LEFT (KITS) ×2 IMPLANT
PACK CARDIAC CATHETERIZATION (CUSTOM PROCEDURE TRAY) ×2 IMPLANT
SHEATH PINNACLE 5F 10CM (SHEATH) ×2 IMPLANT
SYR MEDRAD MARK V 150ML (SYRINGE) ×2 IMPLANT
TRANSDUCER W/STOPCOCK (MISCELLANEOUS) ×4 IMPLANT
TUBING CIL FLEX 10 FLL-RA (TUBING) ×2 IMPLANT
WIRE EMERALD 3MM-J .035X150CM (WIRE) ×2 IMPLANT
WIRE HI TORQ VERSACORE-J 145CM (WIRE) ×2 IMPLANT

## 2018-07-25 NOTE — Interval H&P Note (Signed)
Cath Lab Visit (complete for each Cath Lab visit)  Clinical Evaluation Leading to the Procedure:   ACS: No.  Non-ACS:    Anginal Classification: CCS II  Anti-ischemic medical therapy: Maximal Therapy (2 or more classes of medications)  Non-Invasive Test Results: Intermediate-risk stress test findings: cardiac mortality 1-3%/year  Prior CABG: Previous CABG      History and Physical Interval Note:  07/25/2018 10:51 AM  [name] [name]  has presented today for surgery, with the diagnosis of abnormal mioview  The various methods of treatment have been discussed with the patient and family. After consideration of risks, benefits and other options for treatment, the patient has consented to  Procedure(s): LEFT HEART CATH AND CORS/GRAFTS ANGIOGRAPHY (N/A) as a surgical intervention .  The patient's history has been reviewed, patient examined, no change in status, stable for surgery.  I have reviewed the patient's chart and labs.  Questions were answered to the patient's satisfaction.     Nanetta Batty

## 2018-07-25 NOTE — Progress Notes (Signed)
Right femoral artery sheath removed and pressure held for 20 minutes. Groin is level 0, right distal pedal pulse is palpable and patient has been given post hemostasis instructions. Downtime begins at 1220 for 4 hours. There have been no complications.

## 2018-07-25 NOTE — Discharge Instructions (Signed)
°  HOLD METFORMIN FOR A FULL 48 HOURS AFTER DISCHARGE. ° ° °DRINK PLENTY OF FLUIDS FOR THE NEXT 2-3 DAYS TO KEEP HYDRATED. ° ° °Femoral Site Care °Refer to this sheet in the next few weeks. These instructions provide you with information about caring for yourself after your procedure. Your health care provider may also give you more specific instructions. Your treatment has been planned according to current medical practices, but problems sometimes occur. Call your health care provider if you have any problems or questions after your procedure. °What can I expect after the procedure? °After your procedure, it is typical to have the following: °· Bruising at the site that usually fades within 1-2 weeks. °· Blood collecting in the tissue (hematoma) that may be painful to the touch. It should usually decrease in size and tenderness within 1-2 weeks. ° °Follow these instructions at home: °· Take medicines only as directed by your health care provider. °· You may shower 24-48 hours after the procedure or as directed by your health care provider. Remove the bandage (dressing) and gently wash the site with plain soap and water. Pat the area dry with a clean towel. Do not rub the site, because this may cause bleeding. °· Do not take baths, swim, or use a hot tub until your health care provider approves. °· Check your insertion site every day for redness, swelling, or drainage. °· Do not apply powder or lotion to the site. °· Limit use of stairs to twice a day for the first 2-3 days or as directed by your health care provider. °· Do not squat for the first 2-3 days or as directed by your health care provider. °· Do not lift over 10 lb (4.5 kg) for 5 days after your procedure or as directed by your health care provider. °· Ask your health care provider when it is okay to: °? Return to work or school. °? Resume usual physical activities or sports. °? Resume sexual activity. °· Do not drive home if you are discharged the same  day as the procedure. Have someone else drive you. °· You may drive 24 hours after the procedure unless otherwise instructed by your health care provider. °· Do not operate machinery or power tools for 24 hours after the procedure or as directed by your health care provider. °· If your procedure was done as an outpatient procedure, which means that you went home the same day as your procedure, a responsible adult should be with you for the first 24 hours after you arrive home. °· Keep all follow-up visits as directed by your health care provider. This is important. °Contact a health care provider if: °· You have a fever. °· You have chills. °· You have increased bleeding from the site. Hold pressure on the site. °Get help right away if: °· You have unusual pain at the site. °· You have redness, warmth, or swelling at the site. °· You have drainage (other than a small amount of blood on the dressing) from the site. °· The site is bleeding, and the bleeding does not stop after 30 minutes of holding steady pressure on the site. °· Your leg or foot becomes pale, cool, tingly, or numb. °This information is not intended to replace advice given to you by your health care provider. Make sure you discuss any questions you have with your health care provider. °Document Released: 06/08/2014 Document Revised: 03/12/2016 Document Reviewed: 04/24/2014 °Elsevier Interactive Patient Education © 2018 [employer] ° °

## 2018-08-04 IMAGING — CR DG CHEST 2V
2 series · 2 of 2 positions shown · non-contrast
Comparison: 08/03/2017

CLINICAL DATA: S/p CABG 07/30/2017, some s.o.b now w exertion, stat
reading, pt gone to office now for appt

EXAM:
CHEST - 2 VIEW

[w chest pa]
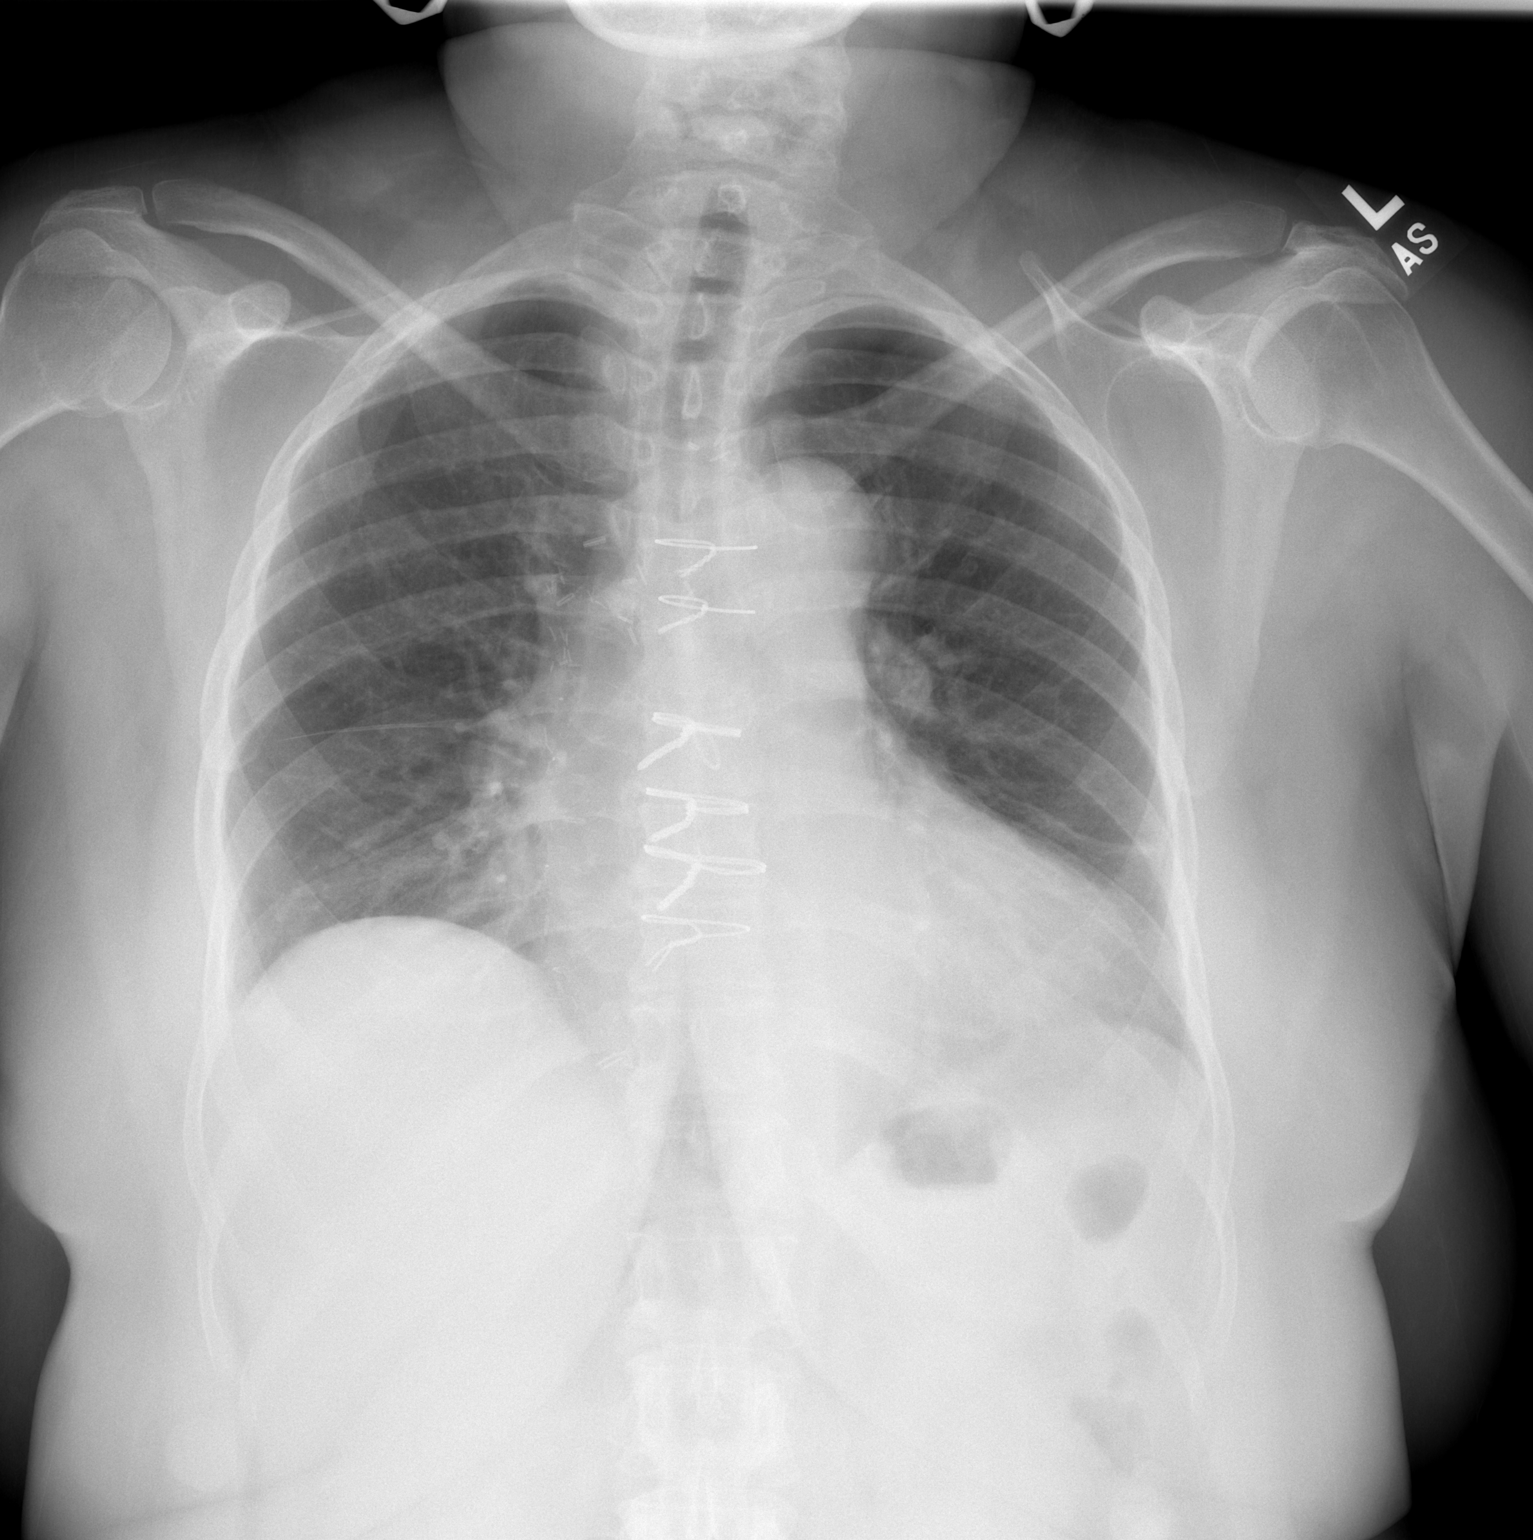

[w chest lat]
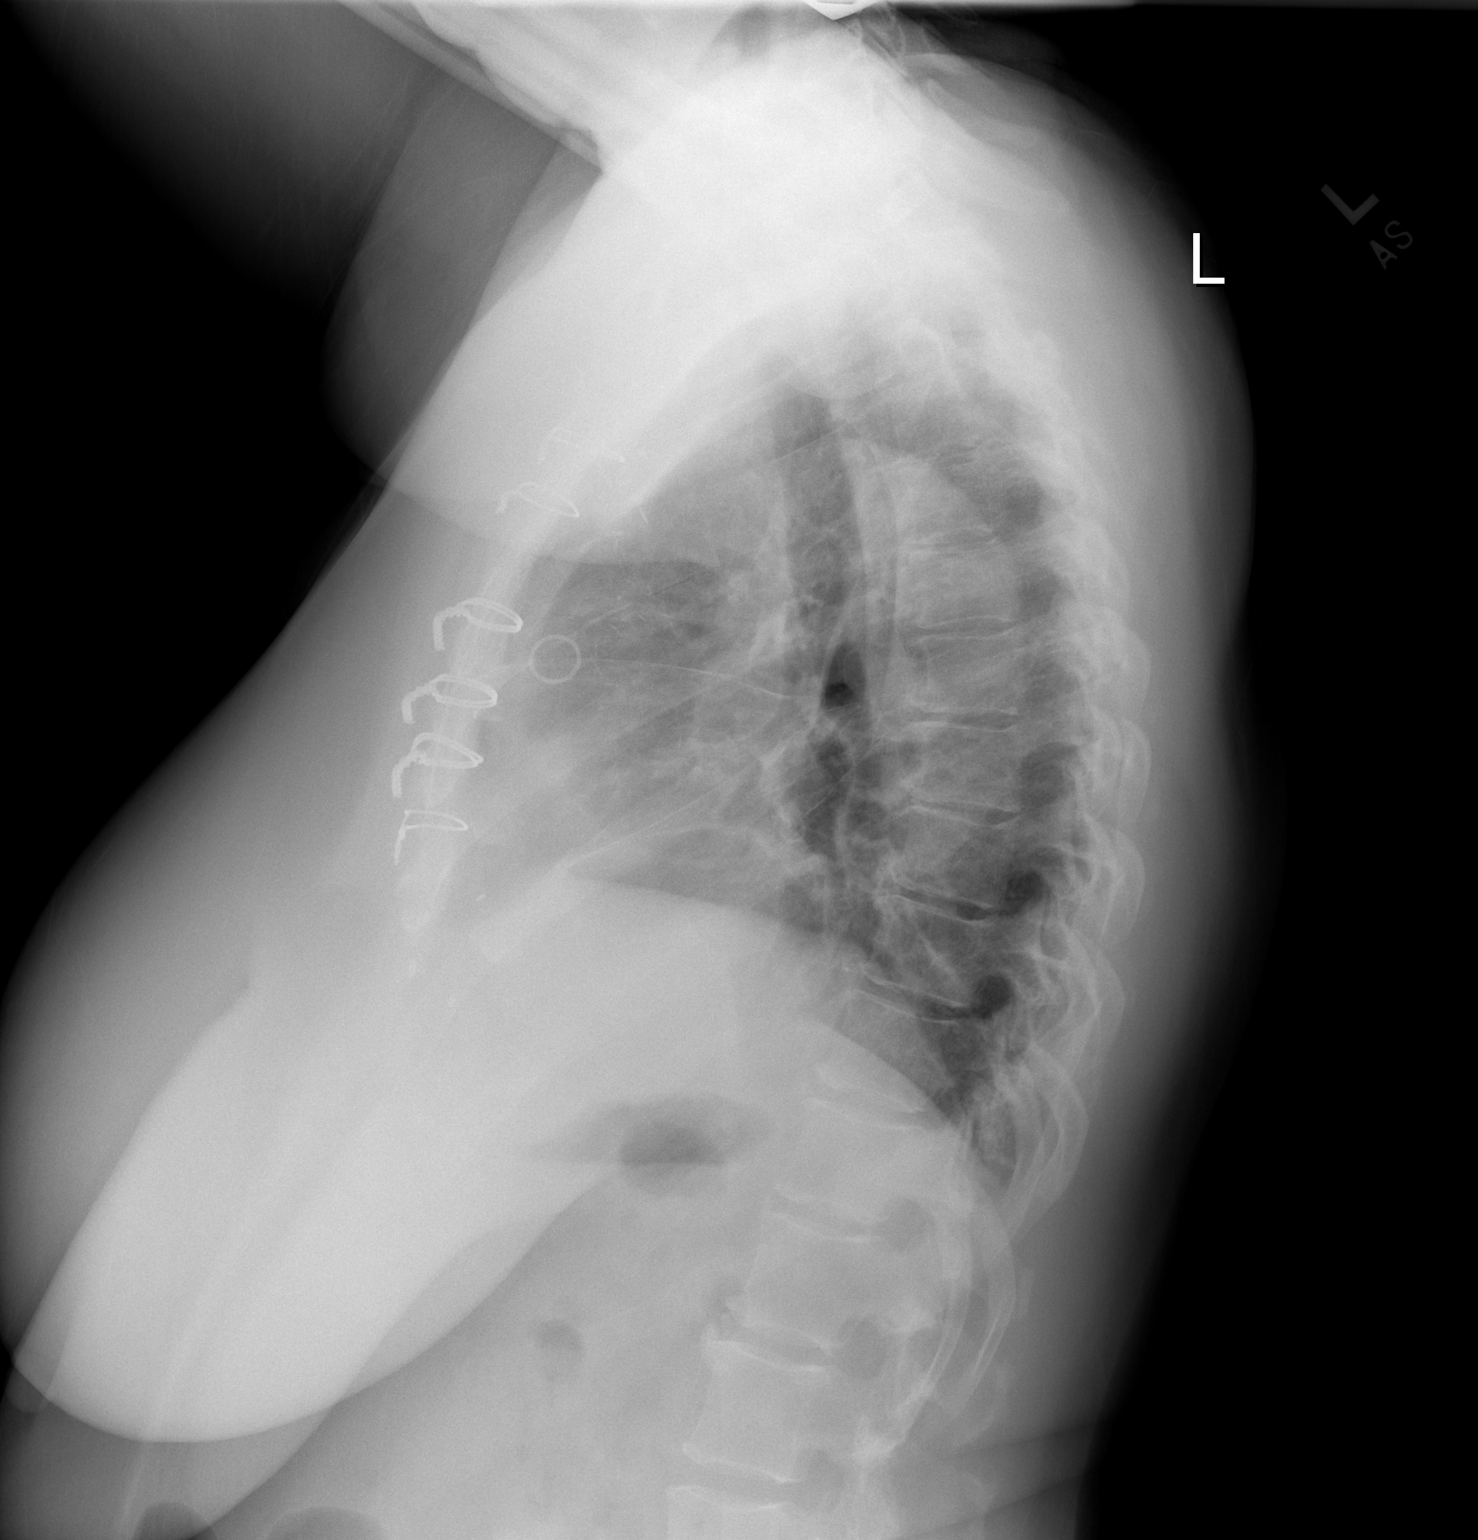

[2 of 2 positions shown; findings below may reference images not displayed]

FINDINGS: Improved aeration in the lung bases with some residual linear
scarring or subsegmental atelectasis in the left mid lung. No overt
edema.

Heart size upper limits normal.

No effusion.  No pneumothorax.

Changes of median sternotomy and CABG. Anterior vertebral endplate
spurring at multiple levels in the lower thoracic spine.
IMPRESSION: 1. Improved pulmonary aeration.  No acute findings post CABG.

## 2018-08-11 ENCOUNTER — Ambulatory Visit: Payer: Managed Care, Other (non HMO) | Admitting: Cardiology

## 2018-08-11 ENCOUNTER — Encounter: Payer: Self-pay | Admitting: Cardiology

## 2018-08-11 VITALS — BP 102/68 | HR 68 | Ht 61.0 in | Wt 197.0 lb

## 2018-08-11 DIAGNOSIS — I10 Essential (primary) hypertension: Secondary | ICD-10-CM

## 2018-08-11 DIAGNOSIS — E785 Hyperlipidemia, unspecified: Secondary | ICD-10-CM | POA: Diagnosis not present

## 2018-08-11 DIAGNOSIS — Z951 Presence of aortocoronary bypass graft: Secondary | ICD-10-CM | POA: Diagnosis not present

## 2018-08-11 MED ORDER — VALSARTAN 160 MG PO TABS: 160.0000 mg | ORAL_TABLET | Freq: Every day | ORAL | 10 refills | Status: DC

## 2018-08-11 NOTE — Patient Instructions (Signed)
Medication Instructions:  DECREASE Diovan to 160mg  Take 1 tablet daily at bedtime   If you need a refill on your cardiac medications before your next appointment, please call your pharmacy.   Lab work: Your physician recommends that you return for lab work in: February 2020 FASTING LIPIDS If you have labs (blood work) drawn today and your tests are completely normal, you will receive your results only by: Marland Kitchen MyChart Message (if you have MyChart) OR . A paper copy in the mail If you have any lab test that is abnormal or we need to change your treatment, we will call you to review the results.  Testing/Procedures: NONE  Follow-Up: At [facility], you and your health needs are our priority.  As part of our continuing mission to provide you with exceptional heart care, we have created designated Provider Care Teams.  These Care Teams include your primary Cardiologist (physician) and Advanced Practice Providers (APPs -  Physician Assistants and Nurse Practitioners) who all work together to provide you with the care you need, when you need it. Your physician recommends that you schedule a follow-up appointment in: 3-4 MONTHS WITH DR Allyson Sabal.  Any Other Special Instructions Will Be Listed Below (If Applicable).

## 2018-08-11 NOTE — Progress Notes (Signed)
08/11/2018 [name] [name]   [birth year]  [id number]  Primary Physician Renford Dills, MD Primary Cardiologist: dr Allyson Sabal  HPI:  Pleasant 52 y/o overweight AA female, works for a [employer], with a history of NIDDM, HTN, and HLD who presented with a NSTEMI 07/28/17. Cath showed severe CAD with normal LVF. It was decided to proceed with CABG x4 which was done 07/30/17 .She had a RIMA to the PDA, vein to the diagonal and sequential left radial to OM 2 and 3.  She tolerated this well and was discharged 08/06/17.  She has done well post op. Her lipid therapy was adjusted in Nov 2018 and her LDL improved from 167 to 90. She was seen in the office 07/14/18 for a 6 month check. She related a history of intermittent Lt arm discomfort, worse when she is carrying something. She also complains of vague SSCP- had for her to describe. OP Myoview was obtained and was read as abnormal with transient ischemic dilatation. She underwent diagnostic angiogram 07/25/18 which revealed widely patent grafts and normal LVF. Dr Allyson Sabal feels she had a false positive Myoview. She is in the office today for follow up. She is doing well. Some of her complaints are Lt arm and hand tingling and numbness. I suspect this may be secondary to her Lt radial endarterectomy.    Current Outpatient Medications  Medication Sig Dispense Refill  . amLODipine (NORVASC) 10 MG tablet TAKE 1 TABLET BY MOUTH EVERY DAY 30 tablet 6  . aspirin 325 MG EC tablet TAKE 1 TABLET BY MOUTH EVERY DAY 30 tablet 6  . cholecalciferol (VITAMIN D) 1000 units tablet Take 2,000 Units by mouth daily.    Marland Kitchen glyBURIDE (DIABETA) 5 MG tablet Take 5 mg by mouth daily.    . metFORMIN (GLUCOPHAGE) 500 MG tablet Take 1,000 mg by mouth 2 (two) times daily.     . metoprolol tartrate (LOPRESSOR) 100 MG tablet TAKE 1 TABLET BY MOUTH TWICE A DAY (Patient taking differently: Take 100 mg by mouth. ) 180 tablet 1  . valsartan (DIOVAN) 160 MG tablet Take 1 tablet (160  mg total) by mouth at bedtime. 30 tablet 10  . atorvastatin (LIPITOR) 40 MG tablet Take 1 tablet (40 mg total) by mouth daily. 90 tablet 3   No current facility-administered medications for this visit.     Allergies  Allergen Reactions  . Penicillins Hives    Has patient had a PCN reaction causing immediate rash, facial/tongue/throat swelling, SOB or lightheadedness with hypotension: unkn Has patient had a PCN reaction causing severe rash involving mucus membranes or skin necrosis: unkn Has patient had a PCN reaction that required hospitalization: no Has patient had a PCN reaction occurring within the last 10 years: no If all of the above answers are "NO", then may proceed with Cephalosporin use.     Past Medical History:  Diagnosis Date  . Coronary artery disease   . Diabetes mellitus without complication (HCC)   . Hyperlipidemia   . Hypertension   . Obesity     Social History   Socioeconomic History  . Marital status: Married    Spouse name: Not on file  . Number of children: Not on file  . Years of education: Not on file  . Highest education level: Not on file  Occupational History  . Not on file  Social Needs  . Financial resource strain: Not on file  . Food insecurity:    Worry: Not on file  Inability: Not on file  . Transportation needs:    Medical: Not on file    Non-medical: Not on file  Tobacco Use  . Smoking status: Never Smoker  . Smokeless tobacco: Never Used  Substance and Sexual Activity  . Alcohol use: Yes  . Drug use: No  . Sexual activity: Not on file  Lifestyle  . Physical activity:    Days per week: 4 days    Minutes per session: 30 min  . Stress: Rather much  Relationships  . Social connections:    Talks on phone: Not on file    Gets together: Not on file    Attends religious service: Not on file    Active member of club or organization: Not on file    Attends meetings of clubs or organizations: Not on file    Relationship status:  Not on file  . Intimate partner violence:    Fear of current or ex partner: Not on file    Emotionally abused: Not on file    Physically abused: Not on file    Forced sexual activity: Not on file  Other Topics Concern  . Not on file  Social History Narrative  . Not on file     Family History  Problem Relation Age of Onset  . Hypertension Other      Review of Systems: General: negative for chills, fever, night sweats or weight changes.  Cardiovascular: negative for chest pain, dyspnea on exertion, edema, orthopnea, palpitations, paroxysmal nocturnal dyspnea or shortness of breath Dermatological: negative for rash Respiratory: negative for cough or wheezing Urologic: negative for hematuria Abdominal: negative for nausea, vomiting, diarrhea, bright red blood per rectum, melena, or hematemesis Neurologic: negative for visual changes, syncope, or dizziness All other systems reviewed and are otherwise negative except as noted above.    Blood pressure 102/68, pulse 68, height 5\' 1"  (1.549 m), weight 197 lb (89.4 kg).  General appearance: alert, cooperative, no distress and mildly obese Lungs: clear to auscultation bilaterally Heart: regular rate and rhythm Extremities: no edema Skin: warm and dry Neurologic: Grossly normal  EKG- NSR poor anterior RW  ASSESSMENT AND PLAN:   Chest pain with moderate risk for cardiac etiology False positive GXT Myoview, patent grafts at cath 07/25/18  S/P CABG x 4 Presented with NSTEMI 07/28/17-cath showed severe CAD with normal LVF CABG x 4 with RIMA-PDA, LRA-OM1-OM2, SVG-Dx2  Essential hypertension Her B/P is now running low- re check by me 102/60  Dyslipidemia LDL came down to 90 on Lipitor 40 mg  Diabetes mellitus type II, non insulin dependent (HCC) On oral agents  Obesity (BMI 30-39.9) BMI 37   PLAN  Decrease Diovan to 160 mg Q pm, check lipids in 3 months then f/u with Dr Allyson Sabal.   Corine Shelter PA-C 08/11/2018 1:28  PM

## 2018-08-28 ENCOUNTER — Other Ambulatory Visit: Payer: Self-pay | Admitting: Cardiology

## 2018-11-22 ENCOUNTER — Other Ambulatory Visit (INDEPENDENT_AMBULATORY_CARE_PROVIDER_SITE_OTHER): Payer: Managed Care, Other (non HMO)

## 2018-11-22 DIAGNOSIS — E559 Vitamin D deficiency, unspecified: Secondary | ICD-10-CM

## 2018-11-22 LAB — BASIC METABOLIC PANEL
BUN: 16 mg/dL (ref 6–23)
CHLORIDE: 103 meq/L (ref 96–112)
CO2: 25 meq/L (ref 19–32)
Calcium: 11.1 mg/dL — ABNORMAL HIGH (ref 8.4–10.5)
Creatinine, Ser: 0.75 mg/dL (ref 0.40–1.20)
GFR: 97.76 mL/min (ref >60.00)
Glucose, Bld: 162 mg/dL — ABNORMAL HIGH (ref 70–99)
Potassium: 4.5 mEq/L (ref 3.5–5.1)
Sodium: 139 mEq/L (ref 135–145)

## 2018-11-22 LAB — VITAMIN D 25 HYDROXY (VIT D DEFICIENCY, FRACTURES): VITD: 11.73 ng/mL — ABNORMAL LOW (ref 30.00–100.00)

## 2018-11-25 ENCOUNTER — Ambulatory Visit: Payer: Managed Care, Other (non HMO) | Admitting: Endocrinology

## 2018-11-25 ENCOUNTER — Encounter: Payer: Self-pay | Admitting: Endocrinology

## 2018-11-25 VITALS — BP 130/74 | HR 74 | Ht 61.0 in | Wt 198.8 lb

## 2018-11-25 DIAGNOSIS — E559 Vitamin D deficiency, unspecified: Secondary | ICD-10-CM | POA: Diagnosis not present

## 2018-11-25 DIAGNOSIS — E213 Hyperparathyroidism, unspecified: Secondary | ICD-10-CM

## 2018-11-25 MED ORDER — VITAMIN D (ERGOCALCIFEROL) 1.25 MG (50000 UNIT) PO CAPS: 50000.0000 [IU] | ORAL_CAPSULE | ORAL | 3 refills | Status: DC

## 2018-11-25 NOTE — Progress Notes (Signed)
Patient ID: [name] [name], female   DOB: Sep 18, 1966, 53 y.o.   MRN: [id number]            Chief complaint: Endocrinology follow-up  Referring physician: Dr. Nehemiah SettlePolite   History of Present Illness:  Referring physician:  HYPERCALCEMIA: Review of records show that she had a high calcium done in October 2017 The patient thinks that she has had higher calcium before also but no records are available Apparently at some point she was taking HCTZ and this was stopped with some improvement in calcium  Calcium level on 08/06/16 was 11.2, corrected calcium was 10.5, normal up to 10.3 PTH level from unknown lab 29 done in 10/17 and subsequently 32 in 8/19  RECENT history: Most recent calcium is 11.1 and has been ranging between 10.9-11.3 since 12/18 She feels fairly good with no unusual bone pain, history of fractures or kidney stones   Lab Results  Component Value Date   CALCIUM 11.1 (H) 11/22/2018   CALCIUM 11.3 (H) 07/22/2018   CALCIUM 10.9 (H) 05/23/2018   CALCIUM 11.1 (H) 10/01/2017   CALCIUM 9.3 08/03/2017   CALCIUM 9.1 08/02/2017   CALCIUM 9.2 08/01/2017   CALCIUM 9.4 07/31/2017   CALCIUM 10.0 07/30/2017    She has been on vitamin D supplements 4000 units vitamin D3 Her vitamin D level is still 11.7 This is despite her dose being increased on her last visit She does not take any calcium and she does not drink any milk  RESULTS:  No evidence of osteoporosis on her bone density, done in 09/2017   Lab Results  Component Value Date   PTH 32 05/23/2018   CALCIUM 11.1 (H) 11/22/2018   CAION 1.31 07/31/2017   PHOS 3.2 10/01/2017      25 (OH) Vitamin D level history:  Lab Results  Component Value Date   VD25OH 11.73 (L) 11/22/2018   VD25OH 11.46 (L) 05/23/2018   VD25OH 12.55 (L) 10/01/2017   VD25OH 14.58 (L) 10/05/2016        Allergies as of 11/25/2018      Reactions   Penicillins Hives   Has patient had a PCN reaction causing immediate rash,  facial/tongue/throat swelling, SOB or lightheadedness with hypotension: unkn Has patient had a PCN reaction causing severe rash involving mucus membranes or skin necrosis: unkn Has patient had a PCN reaction that required hospitalization: no Has patient had a PCN reaction occurring within the last 10 years: no If all of the above answers are "NO", then may proceed with Cephalosporin use.      Medication List       Accurate as of November 25, 2018  3:14 PM. Always use your most recent med list.        amLODipine 10 MG tablet Commonly known as:  NORVASC TAKE 1 TABLET BY MOUTH EVERY DAY   aspirin 325 MG EC tablet TAKE 1 TABLET BY MOUTH EVERY DAY   atorvastatin 40 MG tablet Commonly known as:  LIPITOR TAKE 1 TABLET BY MOUTH EVERY DAY   cholecalciferol 1000 units tablet Commonly known as:  VITAMIN D Take 2,000 Units by mouth daily.   glyBURIDE 5 MG tablet Commonly known as:  DIABETA Take 5 mg by mouth daily.   metFORMIN 500 MG tablet Commonly known as:  GLUCOPHAGE Take 1,000 mg by mouth 2 (two) times daily.   metoprolol tartrate 100 MG tablet Commonly known as:  LOPRESSOR TAKE 1 TABLET BY MOUTH TWICE A DAY   valsartan 160 MG tablet  Commonly known as:  DIOVAN Take 1 tablet (160 mg total) by mouth at bedtime.       Allergies:  Allergies  Allergen Reactions  . Penicillins Hives    Has patient had a PCN reaction causing immediate rash, facial/tongue/throat swelling, SOB or lightheadedness with hypotension: unkn Has patient had a PCN reaction causing severe rash involving mucus membranes or skin necrosis: unkn Has patient had a PCN reaction that required hospitalization: no Has patient had a PCN reaction occurring within the last 10 years: no If all of the above answers are "NO", then may proceed with Cephalosporin use.     Past Medical History:  Diagnosis Date  . Coronary artery disease   . Diabetes mellitus without complication (HCC)   . Hyperlipidemia   .  Hypertension   . Obesity     Past Surgical History:  Procedure Laterality Date  . CARDIAC CATHETERIZATION    . CESAREAN SECTION    . CORONARY ARTERY BYPASS GRAFT N/A 07/30/2017   Procedure: CORONARY ARTERY BYPASS GRAFTING (CABG)  x four, using right internal mammary artery, right greater saphenous vein, left radial artery;  Surgeon: Loreli SlotHendrickson, Steven C, MD;  Location: [facility] OR;  Service: Open Heart Surgery;  Laterality: N/A;  . INTRAOPERATIVE TRANSESOPHAGEAL ECHOCARDIOGRAM N/A 07/30/2017   Procedure: INTRAOPERATIVE TRANSESOPHAGEAL ECHOCARDIOGRAM;  Surgeon: Loreli SlotHendrickson, Steven C, MD;  [facility];  Service: Open Heart Surgery;  Laterality: N/A;  . LEFT HEART CATH AND CORONARY ANGIOGRAPHY N/A 07/29/2017   Procedure: LEFT HEART CATH AND CORONARY ANGIOGRAPHY;  Surgeon: SwazilandJordan, Peter M, MD;  [facility];  Service: Cardiovascular;  Laterality: N/A;  . LEFT HEART CATH AND CORS/GRAFTS ANGIOGRAPHY N/A 07/25/2018   Procedure: LEFT HEART CATH AND CORS/GRAFTS ANGIOGRAPHY;  Surgeon: Runell GessBerry, [name] J, MD;  Location: [facility] INVASIVE CV LAB;  Service: Cardiovascular;  Laterality: N/A;  . PARTIAL HYSTERECTOMY  2005  . RADIAL ARTERY HARVEST Left 07/30/2017   Procedure: RADIAL ARTERY HARVEST;  Surgeon: Loreli SlotHendrickson, Steven C, MD;  [facility];  Service: Open Heart Surgery;  Laterality: Left;    Family History  Problem Relation Age of Onset  . Hypertension Other     Social History:  reports that she has never smoked. She has never used smokeless tobacco. She reports current alcohol use. She reports that she does not use drugs.  Review of Systems  She is followed periodically by her PCP for diabetes She is not able to lose any weight  A1c 6.8 in 4/19  Lab Results  Component Value Date   HGBA1C 7.1 (H) 07/29/2017   Lab Results  Component Value Date   LDLCALC 92 11/23/2017   CREATININE 0.75 11/22/2018     EXAM:  BP 130/74 (BP Location: Left Arm, Patient Position: Sitting,  Cuff Size: Large)   Pulse 74   Ht 5\' 1"  (1.549 m)   Wt 198 lb 12.8 oz (90.2 kg)   SpO2 94%   BMI 37.56 kg/m     Assessment/Plan:   HYPERCALCEMIA:  Patient has had mild hyperparathyroidism since at least 2017  Her calcium is still mildly increased but below 11.5, recently fairly consistent since 2018 She is still asymptomatic with no long-term effects such as osteoporosis or nephrolithiasis  Calcium can be continued to be monitored twice a year  VITAMIN D deficiency: Still not on inadequate treatment with her level still very low despite going up to 4000 units vitamin D3 She is taking this with breakfast and the dose was increased on her  last visit For now we will increase her dose up to 50,000 units with vitamin D 2 5 prescription every week Follow-up in 3 months  Recommended that she talk to her PCP regarding more effective diabetes medication for the long run and benefits of weight loss and cardiovascular protection with SGLT2 drugs or even oral GLP-1 drugs   Reather Littler 11/25/2018, 3:14 PM     Note: This office note was prepared with Dragon voice recognition system technology. Any transcriptional errors that result from this process are unintentional.

## 2018-11-29 ENCOUNTER — Ambulatory Visit: Payer: Managed Care, Other (non HMO) | Admitting: Cardiovascular Disease

## 2018-11-29 ENCOUNTER — Encounter: Payer: Self-pay | Admitting: Cardiovascular Disease

## 2018-11-29 DIAGNOSIS — I10 Essential (primary) hypertension: Secondary | ICD-10-CM | POA: Diagnosis not present

## 2018-11-29 DIAGNOSIS — E785 Hyperlipidemia, unspecified: Secondary | ICD-10-CM

## 2018-11-29 DIAGNOSIS — Z951 Presence of aortocoronary bypass graft: Secondary | ICD-10-CM | POA: Diagnosis not present

## 2018-11-29 NOTE — Patient Instructions (Signed)
Medication Instructions:  Your physician recommends that you continue on your current medications as directed. Please refer to the Current Medication list given to you today.  If you need a refill on your cardiac medications before your next appointment, please call your pharmacy.   Lab work: NONE If you have labs (blood work) drawn today and your tests are completely normal, you will receive your results only by: Marland Kitchen MyChart Message (if you have MyChart) OR . A paper copy in the mail If you have any lab test that is abnormal or we need to change your treatment, we will call you to review the results.  Testing/Procedures: NONE  Follow-Up: At [facility], you and your health needs are our priority.  As part of our continuing mission to provide you with exceptional heart care, we have created designated Provider Care Teams.  These Care Teams include your primary Cardiologist (physician) and Advanced Practice Providers (APPs -  Physician Assistants and Nurse Practitioners) who all work together to provide you with the care you need, when you need it. . You will need a follow up appointment in 6 months with an APP and 12 months with Dr. Allyson Sabal.  Please call our office 2 months in advance to schedule this appointment.  You may see one of the following Advanced Practice Providers on your designated Care Team:   . Corine Shelter, [place] . Azalee Course, PA-C . Micah Flesher, PA-C . Joni Reining, DNP . Theodore Demark, PA-C . Judy Pimple, PA-C . Marjie Skiff, PA-C

## 2018-11-29 NOTE — Assessment & Plan Note (Signed)
History of dyslipidemia on statin therapy with lipid profile performed 01/21/2018 revealing total cholesterol 151, LDL 76 and HDL 37.

## 2018-11-29 NOTE — Assessment & Plan Note (Signed)
History of CAD with non-STEMI 07/29/2017.  She was catheterized revealing three-vessel disease and normal LV function ultimate went CABG by Dr. Dorris Fetch with a RIMA to the PDA, vein to the second diagonal branch and sequential radial to OM1 and to diagonal branch. Postoperative course was unremarkable.  Because of left arm pain and a mildly abnormal stress test she underwent diagnostic coronary angiography by myself 07/25/2018 revealing widely patent grafts.

## 2018-11-29 NOTE — Assessment & Plan Note (Signed)
Essential hypertension blood pressure measured today 122/84.  She is on amlodipine, metoprolol and valsartan.

## 2018-11-29 NOTE — Progress Notes (Signed)
11/29/2018 [name] [name]   1966/06/12  [id number]  Primary Physician [name] Carol, MD Primary Cardiologist: Lorretta Harp MD [name] [name], [place]  HPI:  [name] [name] is a 53 y.o.  moderately overweight married African-American female mother of one 29 year old daughter who I last saw in the office 11/23/2017. I met during her hospitalization on 07/29/17 when she was admitted with a non-STEMI. She required recatheterization that day by Dr. Martinique revealing three-vessel disease with normal LV function and bypass grafting the following day by Dr. Roxan Hockey is a RIMA to the PDA, vein to the second diagonal branch and sequential radial to OM 1 and 2. Her postoperative course is uncooperative. She was discharged on 08/06/17.  To briefly participate in cardiac rehab which was prematurely terminated because of hypertension issues.  Because of left arm pain she underwent Myoview stress testing 07/20/2018 which revealed transient ischemic dilatation which could have been an anginal equivalent.  Based on this I performed cardiac catheterization on her as an outpatient 07/25/2018 revealing widely patent grafts.  Her most recent lipid profile on statin therapy performed 01/21/2018 revealed total cholesterol 151, LDL 76 and HDL of 37.  Current Meds  Medication Sig  . amLODipine (NORVASC) 10 MG tablet TAKE 1 TABLET BY MOUTH EVERY DAY  . aspirin 325 MG EC tablet TAKE 1 TABLET BY MOUTH EVERY DAY  . atorvastatin (LIPITOR) 40 MG tablet TAKE 1 TABLET BY MOUTH EVERY DAY  . cholecalciferol (VITAMIN D) 1000 units tablet Take 2,000 Units by mouth daily.  Marland Kitchen glyBURIDE (DIABETA) 5 MG tablet Take 5 mg by mouth daily.  . metFORMIN (GLUCOPHAGE) 500 MG tablet Take 1,000 mg by mouth 2 (two) times daily.   . metoprolol tartrate (LOPRESSOR) 100 MG tablet TAKE 1 TABLET BY MOUTH TWICE A DAY (Patient taking differently: Take 100 mg by mouth. )  . valsartan (DIOVAN) 160 MG tablet Take 1 tablet (160 mg total) by mouth  at bedtime.  . Vitamin D, Ergocalciferol, (DRISDOL) 1.25 MG (50000 UT) CAPS capsule Take 1 capsule (50,000 Units total) by mouth every 7 (seven) days.     Allergies  Allergen Reactions  . Penicillins Hives    Has patient had a PCN reaction causing immediate rash, facial/tongue/throat swelling, SOB or lightheadedness with hypotension: unkn Has patient had a PCN reaction causing severe rash involving mucus membranes or skin necrosis: unkn Has patient had a PCN reaction that required hospitalization: no Has patient had a PCN reaction occurring within the last 10 years: no If all of the above answers are "NO", then may proceed with Cephalosporin use.     Social History   Socioeconomic History  . Marital status: Married    Spouse name: Not on file  . Number of children: Not on file  . Years of education: Not on file  . Highest education level: Not on file  Occupational History  . Not on file  Social Needs  . Financial resource strain: Not on file  . Food insecurity:    Worry: Not on file    Inability: Not on file  . Transportation needs:    Medical: Not on file    Non-medical: Not on file  Tobacco Use  . Smoking status: Never Smoker  . Smokeless tobacco: Never Used  Substance and Sexual Activity  . Alcohol use: Yes  . Drug use: No  . Sexual activity: Not on file  Lifestyle  . Physical activity:    Days per week: 4 days  Minutes per session: 30 min  . Stress: Rather much  Relationships  . Social connections:    Talks on phone: Not on file    Gets together: Not on file    Attends religious service: Not on file    Active member of club or organization: Not on file    Attends meetings of clubs or organizations: Not on file    Relationship status: Not on file  . Intimate partner violence:    Fear of current or ex partner: Not on file    Emotionally abused: Not on file    Physically abused: Not on file    Forced sexual activity: Not on file  Other Topics Concern  .  Not on file  Social History Narrative  . Not on file     Review of Systems: General: negative for chills, fever, night sweats or weight changes.  Cardiovascular: negative for chest pain, dyspnea on exertion, edema, orthopnea, palpitations, paroxysmal nocturnal dyspnea or shortness of breath Dermatological: negative for rash Respiratory: negative for cough or wheezing Urologic: negative for hematuria Abdominal: negative for nausea, vomiting, diarrhea, bright red blood per rectum, melena, or hematemesis Neurologic: negative for visual changes, syncope, or dizziness All other systems reviewed and are otherwise negative except as noted above.    Blood pressure 122/84, pulse 74, height 5' (1.524 m), weight 198 lb 3.2 oz (89.9 kg).  General appearance: alert and no distress Neck: no adenopathy, no carotid bruit, no JVD, supple, symmetrical, trachea midline and thyroid not enlarged, symmetric, no tenderness/mass/nodules Lungs: clear to auscultation bilaterally Heart: regular rate and rhythm, S1, S2 normal, no murmur, click, rub or gallop Extremities: extremities normal, atraumatic, no cyanosis or edema Pulses: 2+ and symmetric Skin: Skin color, texture, turgor normal. No rashes or lesions Neurologic: Alert and oriented X 3, normal strength and tone. Normal symmetric reflexes. Normal coordination and gait  EKG not performed today  ASSESSMENT AND PLAN:   Dyslipidemia History of dyslipidemia on statin therapy with lipid profile performed 01/21/2018 revealing total cholesterol 151, LDL 76 and HDL 37.  Essential hypertension Essential hypertension blood pressure measured today 122/84.  She is on amlodipine, metoprolol and valsartan.  Hx of CABG History of CAD with non-STEMI 07/29/2017.  She was catheterized revealing three-vessel disease and normal LV function ultimate went CABG by Dr. Roxan Hockey with a RIMA to the PDA, vein to the second diagonal branch and sequential radial to OM1 and to  diagonal branch. Postoperative course was unremarkable.  Because of left arm pain and a mildly abnormal stress test she underwent diagnostic coronary angiography by myself 07/25/2018 revealing widely patent grafts.      Lorretta Harp MD FACP,FACC,FAHA, [facility] 11/29/2018 9:56 AM

## 2018-12-26 ENCOUNTER — Other Ambulatory Visit: Payer: Self-pay | Admitting: Cardiovascular Disease

## 2019-02-20 ENCOUNTER — Other Ambulatory Visit: Payer: Managed Care, Other (non HMO)

## 2019-02-23 ENCOUNTER — Ambulatory Visit: Payer: Managed Care, Other (non HMO) | Admitting: Endocrinology

## 2019-03-03 ENCOUNTER — Other Ambulatory Visit: Payer: Self-pay

## 2019-03-03 ENCOUNTER — Other Ambulatory Visit (INDEPENDENT_AMBULATORY_CARE_PROVIDER_SITE_OTHER): Payer: Managed Care, Other (non HMO)

## 2019-03-03 DIAGNOSIS — E213 Hyperparathyroidism, unspecified: Secondary | ICD-10-CM

## 2019-03-03 DIAGNOSIS — E559 Vitamin D deficiency, unspecified: Secondary | ICD-10-CM

## 2019-03-03 LAB — BASIC METABOLIC PANEL
BUN: 11 mg/dL (ref 6–23)
CO2: 25 mEq/L (ref 19–32)
Calcium: 10.5 mg/dL (ref 8.4–10.5)
Chloride: 100 mEq/L (ref 96–112)
Creatinine, Ser: 0.69 mg/dL (ref 0.40–1.20)
GFR: 107.53 mL/min (ref >60.00)
Glucose, Bld: 198 mg/dL — ABNORMAL HIGH (ref 70–99)
Potassium: 4 mEq/L (ref 3.5–5.1)
Sodium: 136 mEq/L (ref 135–145)

## 2019-03-03 LAB — VITAMIN D 25 HYDROXY (VIT D DEFICIENCY, FRACTURES): VITD: 19.97 ng/mL — ABNORMAL LOW (ref 30.00–100.00)

## 2019-03-05 NOTE — Progress Notes (Signed)
Patient ID: [name] [name], female   DOB: April 30, 1966, 53 y.o.   MRN: [id number]             Today's office visit was provided via telemedicine using video technique Explained to the patient and the the limitations of evaluation and management by telemedicine and the availability of in person appointments.  The patient understood the limitations and agreed to proceed. Patient also understood that the telehealth visit is billable. . Location of the patient: Home . Location of the provider: Office Only the patient and myself were participating in the encounter   Chief complaint: Endocrinology follow-up  History of Present Illness:   HYPERCALCEMIA: Review of records show that she had a high calcium done in October 2017 The patient thinks that she has had higher calcium before also but no records are available Apparently at some point she was taking HCTZ and this was stopped with some improvement in calcium  Calcium level on 08/06/16 was 11.2, corrected calcium was 10.5, normal up to 10.3 PTH level from unknown lab 29 done in 10/17 and subsequently 32 in 8/19  RECENT history: Her serum calcium has been consistently high and ranging between 10.9-11.3 since 12/18 However now it is upper normal at 10.5  Has been asymptomatic, no history of bone pain, no history of fractures or kidney stones   Lab Results  Component Value Date   CALCIUM 10.5 03/03/2019   CALCIUM 11.1 (H) 11/22/2018   CALCIUM 11.3 (H) 07/22/2018   CALCIUM 10.9 (H) 05/23/2018   CALCIUM 11.1 (H) 10/01/2017   CALCIUM 9.3 08/03/2017   CALCIUM 9.1 08/02/2017   CALCIUM 9.2 08/01/2017   CALCIUM 9.4 07/31/2017   Lab Results  Component Value Date   PTH 32 05/23/2018   CALCIUM 10.5 03/03/2019   CAION 1.31 07/31/2017   PHOS 3.2 10/01/2017   No evidence of osteoporosis on her bone density, done in 09/2017      VITAMIN D deficiency:  She has been on vitamin D supplements Previously with 4000 units vitamin D3 her  vitamin D level was still very low at 11.7  Now she is taking prescription 50,000 units weekly Although she was told to continue her D3 supplement she has not done so  Vitamin D level is much improved but still below normal  Lab Results  Component Value Date   VD25OH 19.97 (L) 03/03/2019   VD25OH 11.73 (L) 11/22/2018   VD25OH 11.46 (L) 05/23/2018   VD25OH 12.55 (L) 10/01/2017   VD25OH 14.58 (L) 10/05/2016        Allergies as of 03/06/2019      Reactions   Penicillins Hives   Has patient had a PCN reaction causing immediate rash, facial/tongue/throat swelling, SOB or lightheadedness with hypotension: unkn Has patient had a PCN reaction causing severe rash involving mucus membranes or skin necrosis: unkn Has patient had a PCN reaction that required hospitalization: no Has patient had a PCN reaction occurring within the last 10 years: no If all of the above answers are "NO", then may proceed with Cephalosporin use.      Medication List       Accurate as of Mar 05, 2019  4:07 PM. If you have any questions, ask your nurse or doctor.        STOP taking these medications   cholecalciferol 1000 units tablet Commonly known as:  VITAMIN D     TAKE these medications   amLODipine 10 MG tablet Commonly known as:  NORVASC TAKE 1  TABLET BY MOUTH EVERY DAY   aspirin 325 MG EC tablet TAKE 1 TABLET BY MOUTH EVERY DAY   atorvastatin 40 MG tablet Commonly known as:  LIPITOR TAKE 1 TABLET BY MOUTH EVERY DAY   glyBURIDE 5 MG tablet Commonly known as:  DIABETA Take 5 mg by mouth daily.   metFORMIN 500 MG tablet Commonly known as:  GLUCOPHAGE Take 1,000 mg by mouth 2 (two) times daily.   metoprolol tartrate 100 MG tablet Commonly known as:  LOPRESSOR TAKE 1 TABLET BY MOUTH TWICE A DAY   valsartan 160 MG tablet Commonly known as:  DIOVAN Take 1 tablet (160 mg total) by mouth at bedtime.   Vitamin D (Ergocalciferol) 1.25 MG (50000 UT) Caps capsule Commonly known as:   DRISDOL Take 1 capsule (50,000 Units total) by mouth every 7 (seven) days.       Allergies:  Allergies  Allergen Reactions  . Penicillins Hives    Has patient had a PCN reaction causing immediate rash, facial/tongue/throat swelling, SOB or lightheadedness with hypotension: unkn Has patient had a PCN reaction causing severe rash involving mucus membranes or skin necrosis: unkn Has patient had a PCN reaction that required hospitalization: no Has patient had a PCN reaction occurring within the last 10 years: no If all of the above answers are "NO", then may proceed with Cephalosporin use.     Past Medical History:  Diagnosis Date  . Coronary artery disease   . Diabetes mellitus without complication (HCC)   . Hyperlipidemia   . Hypertension   . Obesity     Past Surgical History:  Procedure Laterality Date  . CARDIAC CATHETERIZATION    . CESAREAN SECTION    . CORONARY ARTERY BYPASS GRAFT N/A 07/30/2017   Procedure: CORONARY ARTERY BYPASS GRAFTING (CABG)  x four, using right internal mammary artery, right greater saphenous vein, left radial artery;  Surgeon: Loreli Slot, MD;  Location: [facility] OR;  Service: Open Heart Surgery;  Laterality: N/A;  . INTRAOPERATIVE TRANSESOPHAGEAL ECHOCARDIOGRAM N/A 07/30/2017   Procedure: INTRAOPERATIVE TRANSESOPHAGEAL ECHOCARDIOGRAM;  Surgeon: Loreli Slot, MD;  Location: [facility] OR;  Service: Open Heart Surgery;  Laterality: N/A;  . LEFT HEART CATH AND CORONARY ANGIOGRAPHY N/A 07/29/2017   Procedure: LEFT HEART CATH AND CORONARY ANGIOGRAPHY;  Surgeon: Swaz[place], Peter M, MD;  Location: [facility] INVASIVE CV LAB;  Service: Cardiovascular;  Laterality: N/A;  . LEFT HEART CATH AND CORS/GRAFTS ANGIOGRAPHY N/A 07/25/2018   Procedure: LEFT HEART CATH AND CORS/GRAFTS ANGIOGRAPHY;  Surgeon: Runell Gess, MD;  Location: [facility] INVASIVE CV LAB;  Service: Cardiovascular;  Laterality: N/A;  . PARTIAL HYSTERECTOMY  2005  . RADIAL ARTERY HARVEST Left 07/30/2017    Procedure: RADIAL ARTERY HARVEST;  Surgeon: Loreli Slot, MD;  Location: [facility] OR;  Service: Open Heart Surgery;  Laterality: Left;    Family History  Problem Relation Age of Onset  . Hypertension Other     Social History:  reports that she has never smoked. She has never used smokeless tobacco. She reports current alcohol use. She reports that she does not use drugs.  Review of Systems  She is followed by her PCP for diabetes Currently on metformin and glyburide She was recently evaluated by PCP  A1c recently in 5/20 was 9.4, previously 6.8 in 4/19 Also her nonfasting glucose with the lab was 204 but at home her pre-meal blood sugars have been consistently near normal between 86-121, only once higher at 131 She confirmed that her One Touch very  O test trips are not out of date  She thinks she has been going off her diet and not watching it with the lockdown Also not exercising regularly  She has not discussed her recent blood sugar control with PCP  Lab Results  Component Value Date   HGBA1C 7.1 (H) 07/29/2017   Lab Results  Component Value Date   LDLCALC 92 11/23/2017   CREATININE 0.69 03/03/2019   Recent LDL 96 in 02/2019   EXAM:  There were no vitals taken for this visit.    Assessment/Plan:   VITAMIN D deficiency: She has had significantly low levels not improved with OTC vitamin D3  Her level is almost 20 now with using 50,000 units of vitamin D2  This level may be adequate for her ethnic status However may benefit from continued increase in supplementation using vitamin D3 at least 2000 units daily and recommended adding this to the prescription vitamin D  We will recheck her level in 4 months  HYPERCALCEMIA:  Patient has had mild hyperparathyroidism since at least 2017  Her calcium is not as high as before, currently upper normal This is despite increasing vitamin D supplementation  DIABETES:  This appears poorly controlled with A1c 9.4 She  is on metformin and glyburide managed by PCP Some of her blood sugar control is worsened because of poor diet  Recommended that she talk to her PCP about the choice of oral hypoglycemic drugs Because of her history of CAD she needs preferably to be on SGLT2 or GLP-1 drug  Also recommended checking blood sugars more after meals rather than before eating to help her modify her diet further If PCP recommends endocrinology consultation this can be done  Total visit time for evaluation and management of multiple problems and counseling =25 minutes   Reather LittlerAjay Taavi Hoose 03/05/2019, 4:07 PM     Note: This office note was prepared with Dragon voice recognition system technology. Any transcriptional errors that result from this process are unintentional.

## 2019-03-06 ENCOUNTER — Ambulatory Visit (INDEPENDENT_AMBULATORY_CARE_PROVIDER_SITE_OTHER): Payer: Managed Care, Other (non HMO) | Admitting: Endocrinology

## 2019-03-06 ENCOUNTER — Encounter: Payer: Self-pay | Admitting: Endocrinology

## 2019-03-06 ENCOUNTER — Other Ambulatory Visit: Payer: Self-pay

## 2019-03-06 DIAGNOSIS — E1165 Type 2 diabetes mellitus with hyperglycemia: Secondary | ICD-10-CM

## 2019-03-06 DIAGNOSIS — E559 Vitamin D deficiency, unspecified: Secondary | ICD-10-CM

## 2019-03-06 DIAGNOSIS — E213 Hyperparathyroidism, unspecified: Secondary | ICD-10-CM | POA: Diagnosis not present

## 2019-04-06 ENCOUNTER — Other Ambulatory Visit: Payer: Self-pay | Admitting: Cardiovascular Disease

## 2019-05-05 ENCOUNTER — Other Ambulatory Visit: Payer: Self-pay | Admitting: Endocrinology

## 2019-07-04 ENCOUNTER — Other Ambulatory Visit: Payer: Managed Care, Other (non HMO)

## 2019-07-07 ENCOUNTER — Ambulatory Visit: Payer: Managed Care, Other (non HMO) | Admitting: Endocrinology

## 2019-07-24 ENCOUNTER — Other Ambulatory Visit (INDEPENDENT_AMBULATORY_CARE_PROVIDER_SITE_OTHER): Payer: Managed Care, Other (non HMO)

## 2019-07-24 ENCOUNTER — Other Ambulatory Visit: Payer: Self-pay

## 2019-07-24 DIAGNOSIS — E559 Vitamin D deficiency, unspecified: Secondary | ICD-10-CM | POA: Diagnosis not present

## 2019-07-24 DIAGNOSIS — E213 Hyperparathyroidism, unspecified: Secondary | ICD-10-CM

## 2019-07-24 LAB — BASIC METABOLIC PANEL
BUN: 16 mg/dL (ref 6–23)
CO2: 26 mEq/L (ref 19–32)
Calcium: 11.2 mg/dL — ABNORMAL HIGH (ref 8.4–10.5)
Chloride: 102 mEq/L (ref 96–112)
Creatinine, Ser: 0.79 mg/dL (ref 0.40–1.20)
GFR: 91.84 mL/min (ref >60.00)
Glucose, Bld: 233 mg/dL — ABNORMAL HIGH (ref 70–99)
Potassium: 3.9 mEq/L (ref 3.5–5.1)
Sodium: 138 mEq/L (ref 135–145)

## 2019-07-24 LAB — VITAMIN D 25 HYDROXY (VIT D DEFICIENCY, FRACTURES): VITD: 36.42 ng/mL (ref 30.00–100.00)

## 2019-07-26 ENCOUNTER — Ambulatory Visit: Payer: Managed Care, Other (non HMO) | Admitting: Endocrinology

## 2019-07-26 ENCOUNTER — Other Ambulatory Visit: Payer: Self-pay

## 2019-07-26 ENCOUNTER — Encounter: Payer: Self-pay | Admitting: Endocrinology

## 2019-07-26 VITALS — BP 112/76 | HR 78 | Ht 60.0 in | Wt 195.6 lb

## 2019-07-26 DIAGNOSIS — E1165 Type 2 diabetes mellitus with hyperglycemia: Secondary | ICD-10-CM

## 2019-07-26 DIAGNOSIS — E559 Vitamin D deficiency, unspecified: Secondary | ICD-10-CM

## 2019-07-26 DIAGNOSIS — E213 Hyperparathyroidism, unspecified: Secondary | ICD-10-CM

## 2019-07-26 LAB — POCT GLYCOSYLATED HEMOGLOBIN (HGB A1C): Hemoglobin A1C: 8.3 % — AB (ref 4.0–5.6)

## 2019-07-26 NOTE — Progress Notes (Signed)
Patient ID: [name] [name], female   DOB: February 12, 1966, 53 y.o.   MRN: [id number]              Chief complaint: Endocrinology follow-up  History of Present Illness:   HYPERCALCEMIA: Review of records show that she had a high calcium done in October 2017 The patient thinks that she has had higher calcium before also but no records are available Apparently at some point she was taking HCTZ and this was stopped with some improvement in calcium  Calcium level on 08/06/16 was 11.2, corrected calcium was 10.5, normal up to 10.3  PTH level from unknown lab 29 done in 10/17 and subsequently 32 in 8/19  RECENT history: Her serum calcium has been mostly high and ranging between 10.5 -11.3 since 12/18 Level has come up to 11.2, previously 10.5 Also her vitamin D level has improved  Has been asymptomatic as before, has no history of bone pain, no history of fractures or kidney stones No osteoporosis on previous bone density from 09/2017  Lab Results  Component Value Date   CALCIUM 11.2 (H) 07/24/2019   CALCIUM 10.5 03/03/2019   CALCIUM 11.1 (H) 11/22/2018   CALCIUM 11.3 (H) 07/22/2018   CALCIUM 10.9 (H) 05/23/2018   CALCIUM 11.1 (H) 10/01/2017   CALCIUM 9.3 08/03/2017   CALCIUM 9.1 08/02/2017   CALCIUM 9.2 08/01/2017   Lab Results  Component Value Date   PTH 32 05/23/2018   CALCIUM 11.2 (H) 07/24/2019   CAION 1.31 07/31/2017   PHOS 3.2 10/01/2017        VITAMIN D deficiency:  She has been on vitamin D supplements for persistently low levels below 20 since at least 2018  Now she is taking vitamin D3, 2000 units daily in addition to her prescription 50,000 units weekly This was started on her visit in 5/20 and her level at that time was about 20  Vitamin D level is much improved and now at 36  Lab Results  Component Value Date   VD25OH 36.42 07/24/2019   VD25OH 19.97 (L) 03/03/2019   VD25OH 11.73 (L) 11/22/2018   VD25OH 11.46 (L) 05/23/2018   VD25OH 12.55 (L)  10/01/2017        Allergies as of 07/26/2019      Reactions   Penicillins Hives   Has patient had a PCN reaction causing immediate rash, facial/tongue/throat swelling, SOB or lightheadedness with hypotension: unkn Has patient had a PCN reaction causing severe rash involving mucus membranes or skin necrosis: unkn Has patient had a PCN reaction that required hospitalization: no Has patient had a PCN reaction occurring within the last 10 years: no If all of the above answers are "NO", then may proceed with Cephalosporin use.      Medication List       Accurate as of July 26, 2019  8:58 PM. If you have any questions, ask your nurse or doctor.        amLODipine 10 MG tablet Commonly known as: NORVASC TAKE 1 TABLET BY MOUTH EVERY DAY   aspirin 325 MG EC tablet TAKE 1 TABLET BY MOUTH EVERY DAY   atorvastatin 40 MG tablet Commonly known as: LIPITOR TAKE 1 TABLET BY MOUTH EVERY DAY   glyBURIDE 5 MG tablet Commonly known as: DIABETA Take 5 mg by mouth daily.   metFORMIN 500 MG tablet Commonly known as: GLUCOPHAGE Take 1,000 mg by mouth 2 (two) times daily.   metoprolol tartrate 100 MG tablet Commonly known as: LOPRESSOR TAKE 1  TABLET BY MOUTH TWICE A DAY   valsartan 160 MG tablet Commonly known as: DIOVAN Take 1 tablet (160 mg total) by mouth at bedtime.   Vitamin D (Ergocalciferol) 1.25 MG (50000 UT) Caps capsule Commonly known as: DRISDOL TAKE 1 CAPSULE BY MOUTH EVERY 7 DAYS       Allergies:  Allergies  Allergen Reactions  . Penicillins Hives    Has patient had a PCN reaction causing immediate rash, facial/tongue/throat swelling, SOB or lightheadedness with hypotension: unkn Has patient had a PCN reaction causing severe rash involving mucus membranes or skin necrosis: unkn Has patient had a PCN reaction that required hospitalization: no Has patient had a PCN reaction occurring within the last 10 years: no If all of the above answers are "NO", then may proceed  with Cephalosporin use.     Past Medical History:  Diagnosis Date  . Coronary artery disease   . Diabetes mellitus without complication ([place])   . Hyperlipidemia   . Hypertension   . Obesity     Past Surgical History:  Procedure Laterality Date  . CARDIAC CATHETERIZATION    . CESAREAN SECTION    . CORONARY ARTERY BYPASS GRAFT N/A 07/30/2017   Procedure: CORONARY ARTERY BYPASS GRAFTING (CABG)  x four, using right internal mammary artery, right greater saphenous vein, left radial artery;  Surgeon: Melrose Nakayama, MD;  Location: [place];  Service: Open Heart Surgery;  Laterality: N/A;  . INTRAOPERATIVE TRANSESOPHAGEAL ECHOCARDIOGRAM N/A 07/30/2017   Procedure: INTRAOPERATIVE TRANSESOPHAGEAL ECHOCARDIOGRAM;  Surgeon: Melrose Nakayama, MD;  Location: [place];  Service: Open Heart Surgery;  Laterality: N/A;  . LEFT HEART CATH AND CORONARY ANGIOGRAPHY N/A 07/29/2017   Procedure: LEFT HEART CATH AND CORONARY ANGIOGRAPHY;  Surgeon: Martinique, Peter M, MD;  Location: [place] CV LAB;  Service: Cardiovascular;  Laterality: N/A;  . LEFT HEART CATH AND CORS/GRAFTS ANGIOGRAPHY N/A 07/25/2018   Procedure: LEFT HEART CATH AND CORS/GRAFTS ANGIOGRAPHY;  Surgeon: Lorretta Harp, MD;  Location: [place] CV LAB;  Service: Cardiovascular;  Laterality: N/A;  . PARTIAL HYSTERECTOMY  2005  . RADIAL ARTERY HARVEST Left 07/30/2017   Procedure: RADIAL ARTERY HARVEST;  Surgeon: Melrose Nakayama, MD;  Location: [place];  Service: Open Heart Surgery;  Laterality: Left;    Family History  Problem Relation Age of Onset  . Hypertension Other     Social History:  reports that she has never smoked. She has never used smokeless tobacco. She reports current alcohol use. She reports that she does not use drugs.  Review of Systems  She is followed by her PCP for diabetes  Continues to be on metformin and glyburide  A1c previously in 5/20 was 9.4, now 8.3 However she has not been able to get an  appointment to follow-up with her PCP She also has not discussed an endocrinology consultation with him  She has difficulty losing weight Lab nonfasting glucose was 233   Lab Results  Component Value Date   HGBA1C 8.3 (A) 07/26/2019   HGBA1C 7.1 (H) 07/29/2017   Lab Results  Component Value Date   LDLCALC 92 11/23/2017   CREATININE 0.79 07/24/2019   Last LDL 96 in 02/2019   EXAM:  BP 112/76 (BP Location: Left Arm, Patient Position: Sitting, Cuff Size: Normal)   Pulse 78   Ht 5' (1.524 m)   Wt 195 lb 9.6 oz (88.7 kg)   SpO2 97%   BMI 38.20 kg/m     Assessment/Plan:   VITAMIN D  deficiency: She has had significantly low levels now improved with adding OTC vitamin D3 along with her 50,000 units vitamin D2 weekly  Since likely her vitamin D stores are adequate she can cut down her 50,000 units supplement to twice a month now  HYPERCALCEMIA:  Patient has had mild hyperparathyroidism since at least 2017  Her calcium is still in the same range and slightly higher at 11.2  As before she is asymptomatic and no history of osteopenia   DIABETES:  This appears poorly controlled with consistently high and now 8.3 She is on metformin and glyburide managed by PCP Lab glucose 233  Recommended that she talk to her PCP about endocrinology consultation Because of her history of CAD she needs preferably to be on SGLT2 or GLP-1 drug Currently still has significant obesity also    Reather Littler 07/26/2019, 8:58 PM     Note: This office note was prepared with Dragon voice recognition system technology. Any transcriptional errors that result from this process are unintentional.

## 2019-07-26 NOTE — Patient Instructions (Signed)
Vitamin D Rx 2x per month

## 2019-08-09 ENCOUNTER — Encounter: Payer: Self-pay | Admitting: Cardiology

## 2019-08-09 ENCOUNTER — Ambulatory Visit: Payer: Managed Care, Other (non HMO) | Admitting: Cardiology

## 2019-08-09 ENCOUNTER — Other Ambulatory Visit: Payer: Self-pay

## 2019-08-09 VITALS — BP 122/78 | HR 70 | Temp 96.6°F | Ht 60.0 in | Wt 193.4 lb

## 2019-08-09 DIAGNOSIS — G4719 Other hypersomnia: Secondary | ICD-10-CM

## 2019-08-09 DIAGNOSIS — I10 Essential (primary) hypertension: Secondary | ICD-10-CM | POA: Diagnosis not present

## 2019-08-09 DIAGNOSIS — E119 Type 2 diabetes mellitus without complications: Secondary | ICD-10-CM

## 2019-08-09 DIAGNOSIS — Z951 Presence of aortocoronary bypass graft: Secondary | ICD-10-CM

## 2019-08-09 DIAGNOSIS — E669 Obesity, unspecified: Secondary | ICD-10-CM | POA: Diagnosis not present

## 2019-08-09 DIAGNOSIS — R0683 Snoring: Secondary | ICD-10-CM

## 2019-08-09 DIAGNOSIS — G478 Other sleep disorders: Secondary | ICD-10-CM

## 2019-08-09 DIAGNOSIS — E785 Hyperlipidemia, unspecified: Secondary | ICD-10-CM

## 2019-08-09 NOTE — Patient Instructions (Signed)
Medication Instructions:  Your physician recommends that you continue on your current medications as directed. Please refer to the Current Medication list given to you today. *If you need a refill on your cardiac medications before your next appointment, please call your pharmacy*  Lab Work: None  If you have labs (blood work) drawn today and your tests are completely normal, you will receive your results only by: Marland Kitchen MyChart Message (if you have MyChart) OR . A paper copy in the mail If you have any lab test that is abnormal or we need to change your treatment, we will call you to review the results.  Testing/Procedures: Your physician has recommended that you have a sleep study. This test records several body functions during sleep, including: brain activity, eye movement, oxygen and carbon dioxide blood levels, heart rate and rhythm, breathing rate and rhythm, the flow of air through your mouth and nose, snoring, body muscle movements, and chest and belly movement. WANDA WADDELL OUR SLEEP COORDINATOR WILL BE IN CONTACT WITH YOU ABOUT YOUR SLEEP STUDY  Follow-Up: At [facility], you and your health needs are our priority.  As part of our continuing mission to provide you with exceptional heart care, we have created designated Provider Care Teams.  These Care Teams include your primary Cardiologist (physician) and Advanced Practice Providers (APPs -  Physician Assistants and Nurse Practitioners) who all work together to provide you with the care you need, when you need it.  Your next appointment:   6 months  The format for your next appointment:   In Person  Provider:   You may see Quay Burow, MD or one of the following Advanced Practice Providers on your designated Care Team:    Kerin Ransom, PA-C  [place], [place]  Coletta Memos, [place]   Other Instructions

## 2019-08-09 NOTE — Assessment & Plan Note (Signed)
Followed by Dr Polite 

## 2019-08-09 NOTE — Assessment & Plan Note (Signed)
Controlled.  

## 2019-08-09 NOTE — Assessment & Plan Note (Signed)
Followed by Dr. Kumar.  ?

## 2019-08-09 NOTE — Assessment & Plan Note (Signed)
Her weight is down 5 lbs from her Feb 2020 weight

## 2019-08-09 NOTE — Progress Notes (Signed)
Cardiology Office Note:    Date:  08/09/2019   ID:  [name] [name], DOB 1966/04/09, MRN [id number]  PCP:  Renford Dills, MD  Cardiologist:  Nanetta Batty, MD  Electrophysiologist:  None   Referring MD: Renford Dills, MD   Chief Complaint  Patient presents with  . Follow-up    History of Present Illness:    [name] [name] is a pleasant  53 y.o.AA female with a hx of CAD, s/p CABG 07/30/17 after she presented with a NSTEMI.  She had a re look cath done 07/25/18 that showed patent  Grafts after a false positive Myoview. She sees Dr Lucianne Muss for hypercalcemia and DM, Dr Nehemiah Settle follows her lipids.   The patient is in the office today for routine check up.  She has done well, she is working on loosing weight.  She denies chest pain.  She asked if bariatric surgery would be an option for her and I suggested that is she continued with her diet and exercise efforts she would not need surgery which would be associated with some risk.    Past Medical History:  Diagnosis Date  . Coronary artery disease   . Diabetes mellitus without complication (HCC)   . Hyperlipidemia   . Hypertension   . Obesity     Past Surgical History:  Procedure Laterality Date  . CARDIAC CATHETERIZATION    . CESAREAN SECTION    . CORONARY ARTERY BYPASS GRAFT N/A 07/30/2017   Procedure: CORONARY ARTERY BYPASS GRAFTING (CABG)  x four, using right internal mammary artery, right greater saphenous vein, left radial artery;  Surgeon: Loreli Slot, MD;  Location: [facility] OR;  Service: Open Heart Surgery;  Laterality: N/A;  . INTRAOPERATIVE TRANSESOPHAGEAL ECHOCARDIOGRAM N/A 07/30/2017   Procedure: INTRAOPERATIVE TRANSESOPHAGEAL ECHOCARDIOGRAM;  Surgeon: Loreli Slot, MD;  Location[facility] OR;  Service: Open Heart Surgery;  Laterality: N/A;  . LEFT HEART CATH AND CORONARY ANGIOGRAPHY N/A 07/29/2017   Procedure: LEFT HEART CATH AND CORONARY ANGIOGRAPHY;  Surgeon: Swaziland, Peter M, MD;  Location: [facility] INVASIVE CV  LAB;  Service: Cardiovascular;  Laterality: N/A;  . LEFT HEART CATH AND CORS/GRAFTS ANGIOGRAPHY N/A 07/25/2018   Procedure: LEFT HEART CATH AND CORS/GRAFTS ANGIOGRAPHY;  Surgeon: Runell Gess, MD;  Location: [facility] INVASIVE CV LAB;  Service: Cardiovascular;  Laterality: N/A;  . PARTIAL HYSTERECTOMY  2005  . RADIAL ARTERY HARVEST Left 07/30/2017   Procedure: RADIAL ARTERY HARVEST;  Surgeon: Loreli Slot, MD;  Location: [facility] OR;  Service: Open Heart Surgery;  Laterality: Left;    Current Medications: Current Meds  Medication Sig  . amLODipine (NORVASC) 10 MG tablet TAKE 1 TABLET BY MOUTH EVERY DAY  . aspirin 325 MG EC tablet TAKE 1 TABLET BY MOUTH EVERY DAY  . atorvastatin (LIPITOR) 40 MG tablet TAKE 1 TABLET BY MOUTH EVERY DAY  . Cholecalciferol (VITAMIN D) 50 MCG (2000 UT) tablet Take 2,000 Units by mouth every 14 (fourteen) days.  Marland Kitchen glyBURIDE (DIABETA) 5 MG tablet Take 5 mg by mouth daily.  . metFORMIN (GLUCOPHAGE) 500 MG tablet Take 1,000 mg by mouth 2 (two) times daily.   . metoprolol tartrate (LOPRESSOR) 100 MG tablet TAKE 1 TABLET BY MOUTH TWICE A DAY  . valsartan (DIOVAN) 160 MG tablet Take 1 tablet (160 mg total) by mouth at bedtime.  . Vitamin D, Ergocalciferol, (DRISDOL) 1.25 MG (50000 UT) CAPS capsule TAKE 1 CAPSULE BY MOUTH EVERY 7 DAYS     Allergies:   Penicillins   Social  History   Socioeconomic History  . Marital status: Married    Spouse name: Not on file  . Number of children: Not on file  . Years of education: Not on file  . Highest education level: Not on file  Occupational History  . Not on file  Social Needs  . Financial resource strain: Not on file  . Food insecurity    Worry: Not on file    Inability: Not on file  . Transportation needs    Medical: Not on file    Non-medical: Not on file  Tobacco Use  . Smoking status: Never Smoker  . Smokeless tobacco: Never Used  Substance and Sexual Activity  . Alcohol use: Yes  . Drug use: No  . Sexual  activity: Not on file  Lifestyle  . Physical activity    Days per week: 4 days    Minutes per session: 30 min  . Stress: Rather much  Relationships  . Social Herbalist on phone: Not on file    Gets together: Not on file    Attends religious service: Not on file    Active member of club or organization: Not on file    Attends meetings of clubs or organizations: Not on file    Relationship status: Not on file  Other Topics Concern  . Not on file  Social History Narrative  . Not on file     Family History: The patient's family history includes Hypertension in an other family member.  ROS:   Please see the history of present illness.  She c/o poor sleep, frequent awakenings, daytime fatigue, and snoring    All other systems reviewed and are negative.  EKGs/Labs/Other Studies Reviewed:     EKG:  EKG is ordered today.  The ekg ordered today demonstrates NSR, HR 70  Recent Labs: 07/24/2019: BUN 16; Creatinine, Ser 0.79; Potassium 3.9; Sodium 138  Recent Lipid Panel    Component Value Date/Time   CHOL 156 11/23/2017 0842   TRIG 140 11/23/2017 0842   HDL 36 (L) 11/23/2017 0842   CHOLHDL 4.3 11/23/2017 0842   CHOLHDL 6.6 07/28/2017 1945   VLDL 34 07/28/2017 1945   LDLCALC 92 11/23/2017 0842    Physical Exam:    VS:  BP 122/78   Pulse 70   Temp (!) 96.6 F (35.9 C)   Ht 5' (1.524 m)   Wt 193 lb 6.4 oz (87.7 kg)   BMI 37.77 kg/m     Wt Readings from Last 3 Encounters:  08/09/19 193 lb 6.4 oz (87.7 kg)  07/26/19 195 lb 9.6 oz (88.7 kg)  11/29/18 198 lb 3.2 oz (89.9 kg)     GEN: Overweight AA female, well developed in no acute distress HEENT: Normal NECK: No JVD; No carotid bruits LYMPHATICS: No lymphadenopathy CARDIAC: RRR, no murmurs, rubs, gallops RESPIRATORY:  Clear to auscultation without rales, wheezing or rhonchi  ABDOMEN: Soft, non-tender, non-distended MUSCULOSKELETAL:  No edema; No deformity  SKIN: Warm and dry NEUROLOGIC:  Alert and  oriented x 3 PSYCHIATRIC:  Normal affect   ASSESSMENT:    Hx of CABG Presented with NSTEMI 07/28/17-cath showed severe CAD with normal LVF CABG x 4 with RIMA-PDA, LRA-OM1-OM2, SVG-Dx2 Cath Oct 2019 after false positive Myoview- patent grafts  Diabetes mellitus type II, non insulin dependent ([place]) On oral agents-she is going to ask Dr Dwyane Dee to follow this.  She would be a candidate for empagliflozin or liraglutide- will defer to dr Dwyane Dee.  Obesity (BMI 30-39.9) Her weight is down 5 lbs from her Feb 2020 weight  Dyslipidemia Followed by Dr Nehemiah SettlePolite  Hypercalcemia Followed by Dr Lucianne MussKumar  Essential hypertension Controlled  PLAN:    I have asked her to check with Dr Hendrickson's office about changing her ASA to 81 mg- (I believe  she was placed on ASA 325 mg s/p radial artery harvest).  F/U Dr Allyson SabalBerry 6 months.    Medication Adjustments/Labs and Tests Ordered: Current medicines are reviewed at length with the patient today.  Concerns regarding medicines are outlined above.  Orders Placed This Encounter  Procedures  . EKG 12-Lead  . Split night study   No orders of the defined types were placed in this encounter.   Patient Instructions  Medication Instructions:  Your physician recommends that you continue on your current medications as directed. Please refer to the Current Medication list given to you today. *If you need a refill on your cardiac medications before your next appointment, please call your pharmacy*  Lab Work: None  If you have labs (blood work) drawn today and your tests are completely normal, you will receive your results only by: Marland Kitchen. MyChart Message (if you have MyChart) OR . A paper copy in the mail If you have any lab test that is abnormal or we need to change your treatment, we will call you to review the results.  Testing/Procedures: Your physician has recommended that you have a sleep study. This test records several body functions during sleep,  including: brain activity, eye movement, oxygen and carbon dioxide blood levels, heart rate and rhythm, breathing rate and rhythm, the flow of air through your mouth and nose, snoring, body muscle movements, and chest and belly movement. WANDA WADDELL OUR SLEEP COORDINATOR WILL BE IN CONTACT WITH YOU ABOUT YOUR SLEEP STUDY  Follow-Up: At [facility], you and your health needs are our priority.  As part of our continuing mission to provide you with exceptional heart care, we have created designated Provider Care Teams.  These Care Teams include your primary Cardiologist (physician) and Advanced Practice Providers (APPs -  Physician Assistants and Nurse Practitioners) who all work together to provide you with the care you need, when you need it.  Your next appointment:   6 months  The format for your next appointment:   In Person  Provider:   You may see Nanetta BattyJonathan [name], MD or one of the following Advanced Practice Providers on your designated Care Team:    Corine ShelterLuke Ryett Hamman, PA-C  Marjie Skiffallie Goodrich, [place]  Edd FabianJesse Cleaver, FNP   Other Instructions     Signed, Corine ShelterLuke Kent [place], Cordelia Poche-C  08/09/2019 10:31 AM    [facility]

## 2019-08-09 NOTE — Assessment & Plan Note (Signed)
Presented with NSTEMI 07/28/17-cath showed severe CAD with normal LVF CABG x 4 with RIMA-PDA, LRA-OM1-OM2, SVG-Dx2 Cath Oct 2019 after false positive Myoview- patent grafts

## 2019-08-09 NOTE — Assessment & Plan Note (Signed)
On oral agents-she is going to ask Dr Dwyane Dee to follow this.  She would be a candidate for empagliflozin or liraglutide- will defer to dr Dwyane Dee.

## 2019-08-10 ENCOUNTER — Telehealth: Payer: Self-pay

## 2019-08-10 MED ORDER — ASPIRIN 81 MG PO TBEC: 81.0000 mg | DELAYED_RELEASE_TABLET | Freq: Every day | ORAL | 0 refills | Status: DC

## 2019-08-10 NOTE — Telephone Encounter (Signed)
PATIENT MADE AWARE OF MEDICATION CHANGE AND VOICE UNDERSTANDING.

## 2019-08-10 NOTE — Telephone Encounter (Signed)
-----   Message from Erlene Quan, [place] sent at 08/09/2019  3:55 PM EDT ----- T please tell Ms Hanback to decrease her aspirin to 81 mg daily (I cleared this with dr Candi Leash doesn't need to call)  Kerin Ransom PA-C 08/09/2019 3:55 PM

## 2019-09-01 ENCOUNTER — Other Ambulatory Visit: Payer: Self-pay | Admitting: Cardiovascular Disease

## 2019-09-06 ENCOUNTER — Other Ambulatory Visit: Payer: Self-pay | Admitting: Cardiology

## 2019-09-07 ENCOUNTER — Other Ambulatory Visit: Payer: Self-pay | Admitting: Gynecology

## 2019-09-07 DIAGNOSIS — R928 Other abnormal and inconclusive findings on diagnostic imaging of breast: Secondary | ICD-10-CM

## 2019-09-13 ENCOUNTER — Ambulatory Visit
Admission: RE | Admit: 2019-09-13 | Discharge: 2019-09-13 | Disposition: A | Discharge status: Home / Self Care | Payer: Managed Care, Other (non HMO) | Source: Ambulatory Visit | Attending: Gynecology | Admitting: Gynecology

## 2019-09-13 ENCOUNTER — Other Ambulatory Visit: Payer: Self-pay

## 2019-09-13 DIAGNOSIS — R928 Other abnormal and inconclusive findings on diagnostic imaging of breast: Secondary | ICD-10-CM

## 2019-10-12 ENCOUNTER — Other Ambulatory Visit: Payer: Self-pay | Admitting: Cardiology

## 2019-12-17 ENCOUNTER — Other Ambulatory Visit: Payer: Self-pay | Admitting: Endocrinology

## 2019-12-19 ENCOUNTER — Other Ambulatory Visit: Payer: Self-pay | Admitting: Cardiovascular Disease

## 2019-12-22 ENCOUNTER — Ambulatory Visit: Payer: Managed Care, Other (non HMO) | Attending: Internal Medicine

## 2019-12-22 DIAGNOSIS — Z23 Encounter for immunization: Secondary | ICD-10-CM

## 2019-12-22 NOTE — Progress Notes (Signed)
   Covid-19 Vaccination Clinic  Name:  PEREL HAUSCHILD    MRN: [id number] DOB: 1966-09-04  12/22/2019  Ms. Worthey was observed post Covid-19 immunization for 15 minutes without incident. She was provided with Vaccine Information Sheet and instruction to access the V-Safe system.   Ms. Mehta was instructed to call 911 with any severe reactions post vaccine: Marland Kitchen Difficulty breathing  . Swelling of face and throat  . A fast heartbeat  . A bad rash all over body  . Dizziness and weakness

## 2020-01-17 ENCOUNTER — Ambulatory Visit: Payer: Managed Care, Other (non HMO) | Attending: Internal Medicine

## 2020-01-17 DIAGNOSIS — Z23 Encounter for immunization: Secondary | ICD-10-CM

## 2020-01-17 NOTE — Progress Notes (Signed)
   Covid-19 Vaccination Clinic  Name:  TONNA PALAZZI    MRN: [id number] DOB: Nov 05, 1965  01/17/2020  Ms. Vanyo was observed post Covid-19 immunization for 15 minutes without incident. She was provided with Vaccine Information Sheet and instruction to access the V-Safe system.   Ms. Behlke was instructed to call 911 with any severe reactions post vaccine: Marland Kitchen Difficulty breathing  . Swelling of face and throat  . A fast heartbeat  . A bad rash all over body  . Dizziness and weakness   Immunizations Administered    Name Date Dose VIS Date Route   Pfizer COVID-19 Vaccine 01/17/2020 11:15 AM 0.3 mL 09/29/2019 Intramuscular   Manufacturer: [employer], [employer]   Lot: [id number]   NDC: [address]-5

## 2020-01-18 ENCOUNTER — Other Ambulatory Visit: Payer: Self-pay

## 2020-01-18 ENCOUNTER — Other Ambulatory Visit (INDEPENDENT_AMBULATORY_CARE_PROVIDER_SITE_OTHER): Payer: Managed Care, Other (non HMO)

## 2020-01-18 DIAGNOSIS — E559 Vitamin D deficiency, unspecified: Secondary | ICD-10-CM

## 2020-01-18 DIAGNOSIS — E213 Hyperparathyroidism, unspecified: Secondary | ICD-10-CM | POA: Diagnosis not present

## 2020-01-18 LAB — BASIC METABOLIC PANEL
BUN: 12 mg/dL (ref 6–23)
CO2: 27 mEq/L (ref 19–32)
Calcium: 10.7 mg/dL — ABNORMAL HIGH (ref 8.4–10.5)
Chloride: 102 mEq/L (ref 96–112)
Creatinine, Ser: 0.7 mg/dL (ref 0.40–1.20)
GFR: 105.41 mL/min (ref >60.00)
Glucose, Bld: 152 mg/dL — ABNORMAL HIGH (ref 70–99)
Potassium: 3.8 mEq/L (ref 3.5–5.1)
Sodium: 136 mEq/L (ref 135–145)

## 2020-01-18 LAB — VITAMIN D 25 HYDROXY (VIT D DEFICIENCY, FRACTURES): VITD: 35.56 ng/mL (ref 30.00–100.00)

## 2020-01-25 ENCOUNTER — Ambulatory Visit: Payer: Managed Care, Other (non HMO) | Admitting: Endocrinology

## 2020-01-25 ENCOUNTER — Other Ambulatory Visit: Payer: Self-pay

## 2020-01-25 ENCOUNTER — Encounter: Payer: Self-pay | Admitting: Endocrinology

## 2020-01-25 VITALS — BP 110/72 | Ht 60.0 in | Wt 191.6 lb

## 2020-01-25 DIAGNOSIS — E1165 Type 2 diabetes mellitus with hyperglycemia: Secondary | ICD-10-CM

## 2020-01-25 DIAGNOSIS — E213 Hyperparathyroidism, unspecified: Secondary | ICD-10-CM | POA: Diagnosis not present

## 2020-01-25 DIAGNOSIS — E559 Vitamin D deficiency, unspecified: Secondary | ICD-10-CM | POA: Diagnosis not present

## 2020-01-25 LAB — POCT GLYCOSYLATED HEMOGLOBIN (HGB A1C): Hemoglobin A1C: 8.5 % — AB (ref 4.0–5.6)

## 2020-01-25 MED ORDER — METFORMIN HCL ER 750 MG PO TB24: 750.0000 mg | ORAL_TABLET | Freq: Two times a day (BID) | ORAL | 1 refills | Status: DC

## 2020-01-25 NOTE — Progress Notes (Signed)
Patient ID: [name] [name], female   DOB: [birth year], 54 y.o.   MRN: [id number]              Chief complaint: Endocrinology follow-up  History of Present Illness:  DIABETES:  She has requested consultation for diabetes management and she has discussed this with her PCP who has been previously treating her She was initially diagnosed in 2017  Current management, blood sugar readings, problems identified:  A1c 8.5,  previously   8.3   Continues to be on metformin and glyburide which she has been taking since diagnosis, glyburide was added sometime after Metformin was started  She now says that she tends to get diarrhea with Metformin which is the regular preparation of 1000 mg  Although she is not getting low blood sugar she thinks that glyburide is making her more hungry and she has difficulty losing weight  She checks her sugar with the meter that was supplied by her insurance company and this cannot be downloaded  This was not available today  She feels that her blood sugars are mostly around 200 but only lower by lunchtime  Takes glyburide in the morning  Recently has been trying to start some walking more regularly and she thinks this has helped bring her weight down a few pounds recently  Her dietitian consultation was done at the time of diagnosis but she does not have a meal plan to follow  Usually avoiding sweetened drinks  Blood sugar readings by recall:  PRE-MEAL Fasting Lunch Dinner Bedtime Overall  Glucose range: 228 130 200 200   Mean/median:        Wt Readings from Last 3 Encounters:  01/25/20 191 lb 9.6 oz (86.9 kg)  08/09/19 193 lb 6.4 oz (87.7 kg)  07/26/19 195 lb 9.6 oz (88.7 kg)    Lab Results  Component Value Date   HGBA1C 8.5 (A) 01/25/2020   HGBA1C 8.3 (A) 07/26/2019   HGBA1C 7.1 (H) 07/29/2017   Lab Results  Component Value Date   LDLCALC 92 11/23/2017   CREATININE 0.70 01/18/2020    HYPERCALCEMIA: Review of records show that she  had a high calcium done in October 2017 The patient thinks that she has had higher calcium before also but no records are available Apparently at some point she was taking HCTZ and this was stopped with some improvement in calcium  Calcium level on 08/06/16 was 11.2, corrected calcium was 10.5, normal up to 10.3  PTH level from unknown lab 29 done in 10/17 and subsequently 32 in 8/19  RECENT history: Her serum calcium has been mostly high and ranging between 10.5 -11.3 since 12/18 On her last visit a level was up to 11.2, previously 10.5 and is now back down to 10.7  Also her vitamin D level has improved  Has been asymptomatic as before, has no history of bone pain, no history of fractures or kidney stones No osteoporosis on previous bone density from 09/2017  Lab Results  Component Value Date   CALCIUM 10.7 (H) 01/18/2020   CALCIUM 11.2 (H) 07/24/2019   CALCIUM 10.5 03/03/2019   CALCIUM 11.1 (H) 11/22/2018   CALCIUM 11.3 (H) 07/22/2018   CALCIUM 10.9 (H) 05/23/2018   CALCIUM 11.1 (H) 10/01/2017   CALCIUM 9.3 08/03/2017   CALCIUM 9.1 08/02/2017   Lab Results  Component Value Date   PTH 32 05/23/2018   CALCIUM 10.7 (H) 01/18/2020   CAION 1.31 07/31/2017   PHOS 3.2 10/01/2017  VITAMIN D deficiency:  She has been on vitamin D supplements for persistently low levels below 20 since at least 2018  she is taking vitamin D3, 2000 units daily in addition to her prescription 50,000 units weekly This was started on her visit in 5/20 and her level at that time was about 20  Vitamin D level is consistently improved and now at 36  Lab Results  Component Value Date   VD25OH 35.56 01/18/2020   VD25OH 36.42 07/24/2019   VD25OH 19.97 (L) 03/03/2019   VD25OH 11.73 (L) 11/22/2018   VD25OH 11.46 (L) 05/23/2018        Allergies as of 01/25/2020      Reactions   Penicillins Hives   Has patient had a PCN reaction causing immediate rash, facial/tongue/throat swelling, SOB or  lightheadedness with hypotension: unkn Has patient had a PCN reaction causing severe rash involving mucus membranes or skin necrosis: unkn Has patient had a PCN reaction that required hospitalization: no Has patient had a PCN reaction occurring within the last 10 years: no If all of the above answers are "NO", then may proceed with Cephalosporin use.      Medication List       Accurate as of January 25, 2020  3:45 PM. If you have any questions, ask your nurse or doctor.        amLODipine 10 MG tablet Commonly known as: NORVASC TAKE 1 TABLET BY MOUTH EVERY DAY   aspirin 81 MG EC tablet Take 1 tablet (81 mg total) by mouth daily.   atorvastatin 40 MG tablet Commonly known as: LIPITOR TAKE 1 TABLET BY MOUTH EVERY DAY   glyBURIDE 5 MG tablet Commonly known as: DIABETA Take 5 mg by mouth daily.   metFORMIN 500 MG tablet Commonly known as: GLUCOPHAGE Take 1,000 mg by mouth 2 (two) times daily.   metoprolol tartrate 100 MG tablet Commonly known as: LOPRESSOR TAKE 1 TABLET BY MOUTH TWICE A DAY   valsartan 160 MG tablet Commonly known as: DIOVAN TAKE 1 TABLET BY MOUTH AT BEDTIME   Vitamin D (Ergocalciferol) 1.25 MG (50000 UNIT) Caps capsule Commonly known as: DRISDOL TAKE 1 CAPSULE BY MOUTH ONE TIME PER WEEK   Vitamin D 50 MCG (2000 UT) tablet Take 2,000 Units by mouth every 14 (fourteen) days.       Allergies:  Allergies  Allergen Reactions  . Penicillins Hives    Has patient had a PCN reaction causing immediate rash, facial/tongue/throat swelling, SOB or lightheadedness with hypotension: unkn Has patient had a PCN reaction causing severe rash involving mucus membranes or skin necrosis: unkn Has patient had a PCN reaction that required hospitalization: no Has patient had a PCN reaction occurring within the last 10 years: no If all of the above answers are "NO", then may proceed with Cephalosporin use.     Past Medical History:  Diagnosis Date  . Coronary artery  disease   . Diabetes mellitus without complication ([place])   . Hyperlipidemia   . Hypertension   . Obesity     Past Surgical History:  Procedure Laterality Date  . CARDIAC CATHETERIZATION    . CESAREAN SECTION    . CORONARY ARTERY BYPASS GRAFT N/A 07/30/2017   Procedure: CORONARY ARTERY BYPASS GRAFTING (CABG)  x four, using right internal mammary artery, right greater saphenous vein, left radial artery;  Surgeon: Melrose Nakayama, MD;  Location: [place];  Service: Open Heart Surgery;  Laterality: N/A;  . INTRAOPERATIVE TRANSESOPHAGEAL ECHOCARDIOGRAM N/A 07/30/2017  Procedure: INTRAOPERATIVE TRANSESOPHAGEAL ECHOCARDIOGRAM;  Surgeon: Loreli Slot, MD;  Location: [facility] OR;  Service: Open Heart Surgery;  Laterality: N/A;  . LEFT HEART CATH AND CORONARY ANGIOGRAPHY N/A 07/29/2017   Procedure: LEFT HEART CATH AND CORONARY ANGIOGRAPHY;  Surgeon: Swaziland, Peter M, MD;  Location: [facility] INVASIVE CV LAB;  Service: Cardiovascular;  Laterality: N/A;  . LEFT HEART CATH AND CORS/GRAFTS ANGIOGRAPHY N/A 07/25/2018   Procedure: LEFT HEART CATH AND CORS/GRAFTS ANGIOGRAPHY;  Surgeon: Runell Gess, MD;  Location: [facility] INVASIVE CV LAB;  Service: Cardiovascular;  Laterality: N/A;  . PARTIAL HYSTERECTOMY  2005  . RADIAL ARTERY HARVEST Left 07/30/2017   Procedure: RADIAL ARTERY HARVEST;  Surgeon: Loreli Slot, MD;  [facility];  Service: Open Heart Surgery;  Laterality: Left;    Family History  Problem Relation Age of Onset  . Hypertension Other     Social History:  reports that she has never smoked. She has never used smokeless tobacco. She reports current alcohol use. She reports that she does not use drugs.  Review of Systems  Last LDL 96 in 02/2019, followed by PCP on treatment with Lipitor 40 mg  She said that she has had occasional episodes of pain in her right thigh area on walking without associated numbness or burning Has not discussed with PCP   EXAM:  BP 110/72 (BP  Location: Right Arm, Patient Position: Sitting, Cuff Size: Large)   Ht 5' (1.524 m)   Wt 191 lb 9.6 oz (86.9 kg)   SpO2 97%   BMI 37.42 kg/m   No ankle edema evident  Assessment/Plan:   DIABETES:  She has type 2 diabetes with obesity Appears poorly controlled with consistently high A1c and about the same at 8.5 today  She is on metformin and glyburide with blood sugar readings mostly around 200 although appears to have lower readings midday and today in the office 152  She is reporting difficulty losing weight and increased appetite and may be good candidate for a GLP-1 drug She has some needle phobia and will likely do well with Rybelsus  Discussed with the patient the nature of GLP-1 drugs, the actions on insulin secretion, slowing stomach emptying, reduction of appetite and reduced liver glucose production Explained that Rybelsus improves blood sugar control as well as produces weight loss and reduces cardiovascular events. Explained possible side effects especially nausea and vomiting that may initially; usually side effects improved with time.  Patient to call if nausea or vomiting does not improve within 2 weeks Have explained the need to take the capsules on empty stomach 30 minutes before breakfast daily.   Patient education material given in the sample box  Also she will move her glyburide to the evening and take only half a tablet for now Metformin will be changed to Metformin ER 750 mg, 2 tablets daily because of her current problem with diarrhea and she will follow-up in about a month and decide on her Rybelsus treatment She will also need to bring her monitor for download on the next visit Also consider getting SGLT2 drug if she continues to have high readings Consultation with dietitian  for meal planning and general diabetes education  VITAMIN D deficiency: She has had consistently good levels with vitamin D3 2000 units along with 50,000 units supplement to twice a  month, to continue same regimen  HYPERCALCEMIA:  Patient has had mild hyperparathyroidism since at least 2017  Her calcium is still in the same range as before although  slightly improved at 10.7  Since she is asymptomatic we will continue to monitor periodically  Right leg pain: She will talk to her PCP about this  Reather Littler 01/25/2020, 3:45 PM     Note: This office note was prepared with Dragon voice recognition system technology. Any transcriptional errors that result from this process are unintentional.

## 2020-01-25 NOTE — Patient Instructions (Addendum)
Check blood sugars on waking up 2 days a week  Also check blood sugars about 2 hours after meals and do this after different meals by rotation  Recommended blood sugar levels on waking up are 90-130 and about 2 hours after meal is 130-160  Please bring your blood sugar monitor to each visit, thank you  Glyburide at dinner and 1/2 tab  Rybelsus with 1/2 glass water in am 30 min before food

## 2020-01-25 NOTE — Progress Notes (Signed)
  Per instructions of Dr. Lucianne Muss, pt was given sample of Rybelsus 3mg  tablets.  Sample was labeled using approved sample labels. Sample contained 30 tablets. Lot: EX: 02/23

## 2020-02-02 ENCOUNTER — Telehealth: Payer: Self-pay | Admitting: *Deleted

## 2020-02-02 NOTE — Telephone Encounter (Signed)
Called to rescheduled patient's appointment with Dr. Allyson Sabal from 02/06/20 to 02/21/20. Patient is aware of change.

## 2020-02-06 ENCOUNTER — Ambulatory Visit: Payer: Managed Care, Other (non HMO) | Admitting: Cardiovascular Disease

## 2020-02-21 ENCOUNTER — Other Ambulatory Visit: Payer: Self-pay

## 2020-02-21 ENCOUNTER — Ambulatory Visit: Payer: Managed Care, Other (non HMO) | Admitting: Cardiovascular Disease

## 2020-02-21 ENCOUNTER — Encounter: Payer: Self-pay | Admitting: Cardiovascular Disease

## 2020-02-21 VITALS — BP 122/74 | HR 75 | Ht 60.0 in | Wt 185.2 lb

## 2020-02-21 DIAGNOSIS — E785 Hyperlipidemia, unspecified: Secondary | ICD-10-CM | POA: Diagnosis not present

## 2020-02-21 DIAGNOSIS — Z951 Presence of aortocoronary bypass graft: Secondary | ICD-10-CM | POA: Diagnosis not present

## 2020-02-21 DIAGNOSIS — I10 Essential (primary) hypertension: Secondary | ICD-10-CM

## 2020-02-21 LAB — HEPATIC FUNCTION PANEL
ALT: 18 IU/L (ref 0–32)
AST: 20 IU/L (ref 0–40)
Albumin: 4.7 g/dL (ref 3.8–4.9)
Alkaline Phosphatase: 74 IU/L (ref 39–117)
Bilirubin Total: 0.5 mg/dL (ref 0.0–1.2)
Bilirubin, Direct: 0.12 mg/dL (ref 0.00–0.40)
Total Protein: 8 g/dL (ref 6.0–8.5)

## 2020-02-21 LAB — LIPID PANEL
Chol/HDL Ratio: 4.5 ratio — ABNORMAL HIGH (ref 0.0–4.4)
Cholesterol, Total: 161 mg/dL (ref 100–199)
HDL: 36 mg/dL — ABNORMAL LOW (ref >39)
LDL Chol Calc (NIH): 104 mg/dL — ABNORMAL HIGH (ref 0–99)
Triglycerides: 113 mg/dL (ref 0–149)
VLDL Cholesterol Cal: 21 mg/dL (ref 5–40)

## 2020-02-21 NOTE — Patient Instructions (Signed)
Medication Instructions:  NO CHANGE *If you need a refill on your cardiac medications before your next appointment, please call your pharmacy*   Lab Work: Your physician recommends that you HAVE LAB WORK TODAY  If you have labs (blood work) drawn today and your tests are completely normal, you will receive your results only by: Marland Kitchen MyChart Message (if you have MyChart) OR . A paper copy in the mail If you have any lab test that is abnormal or we need to change your treatment, we will call you to review the results.   Follow-Up: At [facility], you and your health needs are our priority.  As part of our continuing mission to provide you with exceptional heart care, we have created designated Provider Care Teams.  These Care Teams include your primary Cardiologist (physician) and Advanced Practice Providers (APPs -  Physician Assistants and Nurse Practitioners) who all work together to provide you with the care you need, when you need it.  We recommend signing up for the patient portal called "MyChart".  Sign up information is provided on this After Visit Summary.  MyChart is used to connect with patients for Virtual Visits (Telemedicine).  Patients are able to view lab/test results, encounter notes, upcoming appointments, etc.  Non-urgent messages can be sent to your provider as well.   To learn more about what you can do with MyChart, go to [email or web address].    Your next appointment:   12 month(s)  The format for your next appointment:   Either In Person or Virtual  Provider:   You may see Nanetta Batty, MD or one of the following Advanced Practice Providers on your designated Care Team:    Corine Shelter, PA-C  [place], [place]  Edd Fabian, [place]

## 2020-02-21 NOTE — Assessment & Plan Note (Signed)
History of CAD status post CABG by Dr. Dorris Fetch the day after cardiac catheterization performed by Dr. Swaziland 07/29/2017 in the setting of non-STEMI.  She had a RIMA to the PDA, vein to the second diagonal branch and sequential radial to OM1 and to.  She did have a positive stress test 07/20/2018 revealing transient ischemic dilatation and based on this I performed outpatient cath on her 07/25/2018 revealing widely patent grafts.  Since I saw her a year ago she is remained stable.  She gets occasional nocturnal atypical chest pain.

## 2020-02-21 NOTE — Assessment & Plan Note (Signed)
History of essential hypertension a blood pressure measured today 122/74.  She is on amlodipine metoprolol and valsartan.

## 2020-02-21 NOTE — Assessment & Plan Note (Signed)
Lipid liverHistory of dyslipidemia on atorvastatin 40 mg a day profile performed 02/21/2019 revealing cholesterol 161, LDL 96 and HDL 36, not at goal for secondary prevention.  She does state that she eats primarily heart healthy diet but occasionally strays.  We will check a fasting lipid and liver profile today.

## 2020-02-21 NOTE — Progress Notes (Signed)
02/21/2020 [name] [name]   03/27/1966  [id number]  Primary Physician Seward Carol, MD Primary Cardiologist: Lorretta Harp MD [name], [place]  HPI:  [name] [name] is a 54 y.o.  moderately overweight married African-American female mother of one 65 year old daughter whoI last saw in the office  11/29/2018..I met during her hospitalization on 07/29/17 when she was admitted with a non-STEMI. She required recatheterization that day by Dr. Martinique revealing three-vessel disease with normal LV function and bypass grafting the following day by Dr. Roxan Hockey is a RIMA to the PDA, vein to the second diagonal branch and sequential radial to OM 1 and 2. Her postoperative course is uncooperative. She was discharged on 08/06/17.  To briefly participate in cardiac rehab which was prematurely terminated because of hypertension issues.  Because of left arm pain she underwent Myoview stress testing 07/20/2018 which revealed transient ischemic dilatation which could have been an anginal equivalent.  Based on this I performed cardiac catheterization on her as an outpatient 07/25/2018 revealing widely patent grafts.  Her most recent lipid profile on statin therapy performed 02/22/2019 revealed total cholesterol 161, LDL of 96 and HDL 36.  Since I saw her a year ago she did see Kerin Ransom, PA-C in October of last year and had discussed bariatric surgery but continues to pursue diet and exercise.  She has lost 10 pounds since I last saw her.  She denies ischemic chest pain or shortness of breath..   Current Meds  Medication Sig  . amLODipine (NORVASC) 10 MG tablet TAKE 1 TABLET BY MOUTH EVERY DAY  . aspirin 81 MG EC tablet Take 1 tablet (81 mg total) by mouth daily.  Marland Kitchen atorvastatin (LIPITOR) 40 MG tablet TAKE 1 TABLET BY MOUTH EVERY DAY  . Cholecalciferol (VITAMIN D) 50 MCG (2000 UT) tablet Take 2,000 Units by mouth every 14 (fourteen) days.  Marland Kitchen glyBURIDE (DIABETA) 5 MG tablet Take 2.5 mg by mouth  daily with breakfast.  . metFORMIN (GLUCOPHAGE XR) 750 MG 24 hr tablet Take 1 tablet (750 mg total) by mouth 2 (two) times daily after a meal.  . metoprolol tartrate (LOPRESSOR) 100 MG tablet TAKE 1 TABLET BY MOUTH TWICE A DAY  . Semaglutide (RYBELSUS) 3 MG TABS Take by mouth.  . valsartan (DIOVAN) 160 MG tablet TAKE 1 TABLET BY MOUTH AT BEDTIME  . Vitamin D, Ergocalciferol, (DRISDOL) 1.25 MG (50000 UNIT) CAPS capsule TAKE 1 CAPSULE BY MOUTH ONE TIME PER WEEK  . [DISCONTINUED] glyBURIDE (DIABETA) 5 MG tablet Take 5 mg by mouth daily.  . [DISCONTINUED] glyBURIDE (DIABETA) 5 MG tablet Take 2.5 mg by mouth daily with breakfast. Take 1/2 tablet daily     Allergies  Allergen Reactions  . Penicillins Hives    Has patient had a PCN reaction causing immediate rash, facial/tongue/throat swelling, SOB or lightheadedness with hypotension: unkn Has patient had a PCN reaction causing severe rash involving mucus membranes or skin necrosis: unkn Has patient had a PCN reaction that required hospitalization: no Has patient had a PCN reaction occurring within the last 10 years: no If all of the above answers are "NO", then may proceed with Cephalosporin use.     Social History   Socioeconomic History  . Marital status: Married    Spouse name: Not on file  . Number of children: Not on file  . Years of education: Not on file  . Highest education level: Not on file  Occupational History  . Not on file  Tobacco Use  . Smoking status: Never Smoker  . Smokeless tobacco: Never Used  Substance and Sexual Activity  . Alcohol use: Yes  . Drug use: No  . Sexual activity: Not on file  Other Topics Concern  . Not on file  Social History Narrative  . Not on file   Social Determinants of Health   Financial Resource Strain:   . Difficulty of Paying Living Expenses:   Food Insecurity:   . Worried About Charity fundraiser in the Last Year:   . Arboriculturist in the Last Year:   Transportation Needs:    . Film/video editor (Medical):   Marland Kitchen Lack of Transportation (Non-Medical):   Physical Activity:   . Days of Exercise per Week:   . Minutes of Exercise per Session:   Stress:   . Feeling of Stress :   Social Connections:   . Frequency of Communication with Friends and Family:   . Frequency of Social Gatherings with Friends and Family:   . Attends Religious Services:   . Active Member of Clubs or Organizations:   . Attends Archivist Meetings:   Marland Kitchen Marital Status:   Intimate Partner Violence:   . Fear of Current or Ex-Partner:   . Emotionally Abused:   Marland Kitchen Physically Abused:   . Sexually Abused:      Review of Systems: General: negative for chills, fever, night sweats or weight changes.  Cardiovascular: negative for chest pain, dyspnea on exertion, edema, orthopnea, palpitations, paroxysmal nocturnal dyspnea or shortness of breath Dermatological: negative for rash Respiratory: negative for cough or wheezing Urologic: negative for hematuria Abdominal: negative for nausea, vomiting, diarrhea, bright red blood per rectum, melena, or hematemesis Neurologic: negative for visual changes, syncope, or dizziness All other systems reviewed and are otherwise negative except as noted above.    Blood pressure 122/74, pulse 75, height 5' (1.524 m), weight 185 lb 3.2 oz (84 kg), SpO2 97 %.  General appearance: alert and no distress Neck: no adenopathy, no carotid bruit, no JVD, supple, symmetrical, trachea midline and thyroid not enlarged, symmetric, no tenderness/mass/nodules Lungs: clear to auscultation bilaterally Heart: regular rate and rhythm, S1, S2 normal, no murmur, click, rub or gallop Extremities: extremities normal, atraumatic, no cyanosis or edema Pulses: 2+ and symmetric Skin: Skin color, texture, turgor normal. No rashes or lesions Neurologic: Alert and oriented X 3, normal strength and tone. Normal symmetric reflexes. Normal coordination and gait  EKG sinus rhythm  at 75 without ST or T wave changes.  I personally reviewed this EKG.  ASSESSMENT AND PLAN:   Dyslipidemia Lipid liverHistory of dyslipidemia on atorvastatin 40 mg a day profile performed 02/21/2019 revealing cholesterol 161, LDL 96 and HDL 36, not at goal for secondary prevention.  She does state that she eats primarily heart healthy diet but occasionally strays.  We will check a fasting lipid and liver profile today.  Essential hypertension History of essential hypertension a blood pressure measured today 122/74.  She is on amlodipine metoprolol and valsartan.  Hx of CABG History of CAD status post CABG by Dr. Roxan Hockey the day after cardiac catheterization performed by Dr. Martinique 07/29/2017 in the setting of non-STEMI.  She had a RIMA to the PDA, vein to the second diagonal branch and sequential radial to OM1 and to.  She did have a positive stress test 07/20/2018 revealing transient ischemic dilatation and based on this I performed outpatient cath on her 07/25/2018 revealing widely patent grafts.  Since  I saw her a year ago she is remained stable.  She gets occasional nocturnal atypical chest pain.      Lorretta Harp MD FACP,FACC,FAHA, [facility] 02/21/2020 9:51 AM

## 2020-02-22 ENCOUNTER — Encounter: Payer: Managed Care, Other (non HMO) | Attending: Endocrinology | Admitting: Dietician

## 2020-02-22 ENCOUNTER — Encounter: Payer: Self-pay | Admitting: Dietician

## 2020-02-22 ENCOUNTER — Encounter: Payer: Self-pay | Admitting: Endocrinology

## 2020-02-22 ENCOUNTER — Other Ambulatory Visit: Payer: Self-pay

## 2020-02-22 ENCOUNTER — Ambulatory Visit: Payer: Managed Care, Other (non HMO) | Admitting: Endocrinology

## 2020-02-22 VITALS — BP 108/80 | HR 83 | Ht 60.0 in | Wt 186.4 lb

## 2020-02-22 DIAGNOSIS — E1165 Type 2 diabetes mellitus with hyperglycemia: Secondary | ICD-10-CM | POA: Diagnosis not present

## 2020-02-22 DIAGNOSIS — E7800 Pure hypercholesterolemia, unspecified: Secondary | ICD-10-CM

## 2020-02-22 DIAGNOSIS — E119 Type 2 diabetes mellitus without complications: Secondary | ICD-10-CM | POA: Diagnosis present

## 2020-02-22 MED ORDER — RYBELSUS 7 MG PO TABS
1.0000 | ORAL_TABLET | Freq: Every day | ORAL | 3 refills | Status: DC
Start: 2020-02-22 — End: 2020-05-28

## 2020-02-22 NOTE — Patient Instructions (Signed)
Stop Glyburide with 7mg  rybelsus

## 2020-02-22 NOTE — Progress Notes (Signed)
Diabetes Self-Management Education  Visit Type: First/Initial   02/22/2020  [name], identified by name and date of birth, is a 54 y.o. female with a diagnosis of Diabetes: Type 2.   ASSESSMENT  Weight 186 lb 3.2 oz (84.5 kg). Body mass index is 36.36 kg/m.   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 Will also walk on lunch break sometimes and hula hoop a couple days per week. Takes vitamin D. States she dislikes the term "diabetes" and has been in denial of having it over the years. Thinks she cannot have desserts, bread, or most carbohydrate foods because they are bad. Doesn't care for rice or beets, otherwise generally likes most foods. Likes soda, but controls how much she drinks at a time and doesn't buy it so it is not in the house. Typical meal pattern is 3 meals and maybe 2 snacks per day. Will have a few pieces of candy in the evening after dinner to satisfy her craving.   Checks blood sugar ~3 times per day- fasting, before lunch, sometimes before dinner. Readings lately have been 129, 137, 113, 92.    Diabetes Self-Management Education - 02/22/20 1412      Visit Information   Visit Type  First/Initial      Initial Visit   Diabetes Type  Type 2    Are you currently following a meal plan?  No    Are you taking your medications as prescribed?  Yes    Date Diagnosed  2017      Health Coping   How would you rate your overall health?  Fair      Psychosocial Assessment   Patient Belief/Attitude about Diabetes  Afraid    Self-care barriers  None    Self-management support  Doctor's office    Other persons present  Patient    Patient Concerns  Glycemic Control;Weight Control    Special Needs  None    Preferred Learning Style  No preference indicated    Learning Readiness  Ready    How often do you need to have someone help you when you read instructions, pamphlets, or other written materials from your doctor or pharmacy?  1 - Never       Complications   Last HgB A1C per patient/outside source  8.5 %   01/25/20   How often do you check your blood sugar?  1-2 times/day    Fasting Blood glucose range (mg/dL)  54-008    Postprandial Blood glucose range (mg/dL)  67-619    Have you had a dilated eye exam in the past 12 months?  Yes    Have you had a dental exam in the past 12 months?  Yes    Are you checking your feet?  Yes    How many days per week are you checking your feet?  1      Dietary Intake   Breakfast  Glucerna, coffee w/ Splenda & creamer   or peanut butter crackers, or hardboiled egg, or apple/grapes/watermelon; weekends: 1 slice bacon + scrambled egg + waffle w/ fruit   Snack (morning)  fruit cup    Lunch  [employer] kid's meal (chicken + fries, or chicken + fruit)   or grilled chicken, or [employer] chili   Dinner  salad w/ protein   or air-fried chicken, or pork chop + baked potato w/ butter & sour cream + green beans; likes squash, zucchini, kale   Snack (  evening)  3 hot tamales candies    Beverage(s)  water, coffee w/ Splenda & creamer, herbal tea      Exercise   Exercise Type  ADL's;Light (walking / raking leaves)    How many days per week to you exercise?  3    How many minutes per day do you exercise?  30    Total minutes per week of exercise  90      Patient Education   Previous Diabetes Education  Yes (please comment)   NDES Core Classes (2015)   Nutrition management   Role of diet in the treatment of diabetes and the relationship between the three main macronutrients and blood glucose level;Food label reading, portion sizes and measuring food.;Carbohydrate counting;Reviewed blood glucose goals for pre and post meals and how to evaluate the patients' food intake on their blood glucose level.;Effects of alcohol on blood glucose and safety factors with consumption of alcohol.    Physical activity and exercise   Role of exercise on diabetes management, blood pressure control and cardiac health.     Monitoring  Purpose and frequency of SMBG.;Taught/discussed recording of test results and interpretation of SMBG.;Identified appropriate SMBG and/or A1C goals.      Individualized Goals (developed by patient)   Nutrition  Follow meal plan discussed   2-3 carb choices per meal; 0-2 carb choices per snack; protein with all meals and snacks; increase water     Outcomes   Expected Outcomes  Demonstrated interest in learning. Expect positive outcomes    Future DMSE  2 months       Individualized Plan for Diabetes Self-Management Training:   Learning Objective:  Patient will have a greater understanding of diabetes self-management. Patient education plan is to attend individual and/or group sessions per assessed needs and concerns.   Plan:  Patient Instructions  - Aim for 2-3 carb choices (30-45 grams of carbohydrates) per meal  - Aim for 0-2 carb choices (0-30 grams of carbohydrates) per snack - Try to include a source of protein with all meals/snacks - Remember to drink lots of water! Aim for a total of 64 ounces of fluid per day   Expected Outcomes:  Demonstrated interest in learning. Expect positive outcomes  Education material provided: Meal plan card, My Plate, Snack sheet and Carbohydrate counting sheet  If problems or questions, patient to contact team via:  Phone and Email  Future DSME appointment: 2 months

## 2020-02-22 NOTE — Progress Notes (Signed)
Patient ID: [name] [name], female   DOB: 10-23-1965, 54 y.o.   MRN: [id number]              Chief complaint: Endocrinology follow-up  History of Present Illness:  DIABETES type II  She was initially diagnosed in 2017  Current management, blood sugar readings, problems identified:  Medication regimen: Metformin ER 750 mg twice daily, glyburide 2.5 mg at dinner, Rybelsus 3 mg in a.m.  A1c was last 8.5.   On her initial consultation in 01/2020 she was started on Rybelsus 3 mg daily with a sample  Because of tendency to diarrhea her Metformin was changed to extended release and she is on a total of 1500 mg now  Glyburide was changed to evening and she was told to take only half a tablet of 5 mg daily  On her own she has been working on her diet with cutting back on carbohydrates, portions and generally getting protein with each meal  She has seen the dietitian today also and has been given a meal plan  She also continues to walk fairly regularly for exercise  Blood sugar readings were reviewed from download of her Livongo meter  In the last week or so her blood sugars are fairly consistent with a range of 98-141  Lowest blood sugar at lunchtime, has usually a boiled egg in the morning and may have a small fruit midmorning  Previously highest reading 250 and her 30-day average 144  She has not had any hypoglycemic symptoms  Has lost about 5 pounds already  No side effects from Rybelsus  Blood sugar readings by download as above  Previous blood sugars:  PRE-MEAL Fasting Lunch Dinner Bedtime Overall  Glucose range: 228 130 200 200   Mean/median:        Wt Readings from Last 3 Encounters:  02/22/20 186 lb 6.4 oz (84.6 kg)  02/22/20 186 lb 3.2 oz (84.5 kg)  02/21/20 185 lb 3.2 oz (84 kg)    Lab Results  Component Value Date   HGBA1C 8.5 (A) 01/25/2020   HGBA1C 8.3 (A) 07/26/2019   HGBA1C 7.1 (H) 07/29/2017   Lab Results  Component Value Date   LDLCALC 104  (H) 02/21/2020   CREATININE 0.70 01/18/2020    HYPERCALCEMIA: The following is a copy of the previous note:   Review of records show that she had a high calcium done in October 2017 The patient thinks that she has had higher calcium before also but no records are available Apparently at some point she was taking HCTZ and this was stopped with some improvement in calcium  Calcium level on 08/06/16 was 11.2, corrected calcium was 10.5, normal up to 10.3  PTH level from unknown lab 29 done in 10/17 and subsequently 32 in 8/19  RECENT history: Her serum calcium has been mostly high and ranging between 10.5 -11.3 since 12/18 On her last visit a level was up to 11.2, previously 10.5 and is now back down to 10.7  Also her vitamin D level has improved  Has been asymptomatic as before, has no history of bone pain, no history of fractures or kidney stones No osteoporosis on previous bone density from 09/2017  Lab Results  Component Value Date   CALCIUM 10.7 (H) 01/18/2020   CALCIUM 11.2 (H) 07/24/2019   CALCIUM 10.5 03/03/2019   CALCIUM 11.1 (H) 11/22/2018   CALCIUM 11.3 (H) 07/22/2018   CALCIUM 10.9 (H) 05/23/2018   CALCIUM 11.1 (H) 10/01/2017  CALCIUM 9.3 08/03/2017   CALCIUM 9.1 08/02/2017   Lab Results  Component Value Date   PTH 32 05/23/2018   CALCIUM 10.7 (H) 01/18/2020   CAION 1.31 07/31/2017   PHOS 3.2 10/01/2017       VITAMIN D deficiency:  She has been on vitamin D supplements for persistently low levels below 20 since at least 2018  she is taking vitamin D3, 2000 units daily in addition to her prescription 50,000 units weekly This was started on her visit in 5/20 and her level at that time was about 20  Vitamin D level is consistently improved and last at 36  Lab Results  Component Value Date   VD25OH 35.56 01/18/2020   VD25OH 36.42 07/24/2019   VD25OH 19.97 (L) 03/03/2019   VD25OH 11.73 (L) 11/22/2018   VD25OH 11.46 (L) 05/23/2018        Allergies  as of 02/22/2020      Reactions   Penicillins Hives   Has patient had a PCN reaction causing immediate rash, facial/tongue/throat swelling, SOB or lightheadedness with hypotension: unkn Has patient had a PCN reaction causing severe rash involving mucus membranes or skin necrosis: unkn Has patient had a PCN reaction that required hospitalization: no Has patient had a PCN reaction occurring within the last 10 years: no If all of the above answers are "NO", then may proceed with Cephalosporin use.      Medication List       Accurate as of Feb 22, 2020  4:07 PM. If you have any questions, ask your nurse or doctor.        amLODipine 10 MG tablet Commonly known as: NORVASC TAKE 1 TABLET BY MOUTH EVERY DAY   aspirin 81 MG EC tablet Take 1 tablet (81 mg total) by mouth daily.   atorvastatin 40 MG tablet Commonly known as: LIPITOR TAKE 1 TABLET BY MOUTH EVERY DAY   glyBURIDE 5 MG tablet Commonly known as: DIABETA Take 2.5 mg by mouth daily with breakfast.   metFORMIN 750 MG 24 hr tablet Commonly known as: Glucophage XR Take 1 tablet (750 mg total) by mouth 2 (two) times daily after a meal.   metoprolol tartrate 100 MG tablet Commonly known as: LOPRESSOR TAKE 1 TABLET BY MOUTH TWICE A DAY   Rybelsus 7 MG Tabs Generic drug: Semaglutide Take 1 tablet by mouth daily before breakfast. Take 30 minutes before breakfast with water What changed:   medication strength  how much to take  when to take this  additional instructions Changed by: Elayne Snare, MD   valsartan 160 MG tablet Commonly known as: DIOVAN TAKE 1 TABLET BY MOUTH AT BEDTIME   Vitamin D (Ergocalciferol) 1.25 MG (50000 UNIT) Caps capsule Commonly known as: DRISDOL TAKE 1 CAPSULE BY MOUTH ONE TIME PER WEEK   Vitamin D 50 MCG (2000 UT) tablet Take 2,000 Units by mouth every 14 (fourteen) days.       Allergies:  Allergies  Allergen Reactions  . Penicillins Hives    Has patient had a PCN reaction causing  immediate rash, facial/tongue/throat swelling, SOB or lightheadedness with hypotension: unkn Has patient had a PCN reaction causing severe rash involving mucus membranes or skin necrosis: unkn Has patient had a PCN reaction that required hospitalization: no Has patient had a PCN reaction occurring within the last 10 years: no If all of the above answers are "NO", then may proceed with Cephalosporin use.     Past Medical History:  Diagnosis Date  . Coronary  artery disease   . Diabetes mellitus without complication (HCC)   . Hyperlipidemia   . Hypertension   . Obesity     Past Surgical History:  Procedure Laterality Date  . CARDIAC CATHETERIZATION    . CESAREAN SECTION    . CORONARY ARTERY BYPASS GRAFT N/A 07/30/2017   Procedure: CORONARY ARTERY BYPASS GRAFTING (CABG)  x four, using right internal mammary artery, right greater saphenous vein, left radial artery;  Surgeon: Loreli Slot, MD;  Location: [facility] OR;  Service: Open Heart Surgery;  Laterality: N/A;  . INTRAOPERATIVE TRANSESOPHAGEAL ECHOCARDIOGRAM N/A 07/30/2017   Procedure: INTRAOPERATIVE TRANSESOPHAGEAL ECHOCARDIOGRAM;  Surgeon: Loreli Slot, MD;  Location[facility];  Service: Open Heart Surgery;  Laterality: N/A;  . LEFT HEART CATH AND CORONARY ANGIOGRAPHY N/A 07/29/2017   Procedure: LEFT HEART CATH AND CORONARY ANGIOGRAPHY;  Surgeon: Swaziland, Peter M, MD;  Location: [facility] INVASIVE CV LAB;  Service: Cardiovascular;  Laterality: N/A;  . LEFT HEART CATH AND CORS/GRAFTS ANGIOGRAPHY N/A 07/25/2018   Procedure: LEFT HEART CATH AND CORS/GRAFTS ANGIOGRAPHY;  Surgeon: Runell Gess, MD;  Location: [facility] INVASIVE CV LAB;  Service: Cardiovascular;  Laterality: N/A;  . PARTIAL HYSTERECTOMY  2005  . RADIAL ARTERY HARVEST Left 07/30/2017   Procedure: RADIAL ARTERY HARVEST;  Surgeon: Loreli Slot, MD;  Location: [facility] OR;  Service: Open Heart Surgery;  Laterality: Left;    Family History  Problem Relation Age of Onset  .  Hypertension Other     Social History:  reports that she has never smoked. She has never used smokeless tobacco. She reports current alcohol use. She reports that she does not use drugs.  Review of Systems  Previous LDL 96 in 02/2019, followed by PCP/cardiologist on treatment with Lipitor 40 mg She does have a history of CAD making her LDL target 70  Lab Results  Component Value Date   CHOL 161 02/21/2020   CHOL 156 11/23/2017   CHOL 237 (H) 07/28/2017   Lab Results  Component Value Date   HDL 36 (L) 02/21/2020   HDL 36 (L) 11/23/2017   HDL 36 (L) 07/28/2017   Lab Results  Component Value Date   LDLCALC 104 (H) 02/21/2020   LDLCALC 92 11/23/2017   LDLCALC 167 (H) 07/28/2017   Lab Results  Component Value Date   TRIG 113 02/21/2020   TRIG 140 11/23/2017   TRIG 169 (H) 07/28/2017   Lab Results  Component Value Date   CHOLHDL 4.5 (H) 02/21/2020   CHOLHDL 4.3 11/23/2017   CHOLHDL 6.6 07/28/2017   No results found for: LDLDIRECT     EXAM:  BP 108/80 (BP Location: Left Arm, Patient Position: Sitting, Cuff Size: Normal)   Pulse 83   Ht 5' (1.524 m)   Wt 186 lb 6.4 oz (84.6 kg)   SpO2 96%   BMI 36.40 kg/m   No ankle edema  Assessment/Plan:   DIABETES:  Last A1c 8.5  She is now on a regimen of Rybelsus 3 mg, glyburide and Metformin ER  Over the last week with continued medications, dietary changes and regular walking she has lost weight and her blood sugars are more consistently in the target range without hypoglycemia She is also benefiting from Rybelsus with increased satiety Has received a meal plan from the dietitian today.  Since she may benefit further from increasing Rybelsus and be able to stop the glyburide will give her the 7 mg prescription now She can take her last 2 3 mg tablets  tomorrow morning She will then stop the glyburide and let us know in case her blood sugars start going up again Encouraged her to continue the walking program and  follow instructions from the dietitian for meal planning  HYPERLIPIDEMIA: To be addressed by her cardiologist, discussed LDL of 70 or below and she will likely need to be on Zetia in addition to her 40 mg Lipitor to achieve this  Hypertension: Appears well controlled, will need to check urine microalbumin as it has been previously high in 2019  A1c in 2 months  Reather Littler 02/22/2020, 4:07 PM     Note: This office note was prepared with Dragon voice recognition system technology. Any transcriptional errors that result from this process are unintentional.

## 2020-02-22 NOTE — Patient Instructions (Signed)
-   Aim for 2-3 carb choices (30-45 grams of carbohydrates) per meal  - Aim for 0-2 carb choices (0-30 grams of carbohydrates) per snack - Try to include a source of protein with all meals/snacks - Remember to drink lots of water! Aim for a total of 64 ounces of fluid per day

## 2020-02-23 ENCOUNTER — Other Ambulatory Visit: Payer: Self-pay | Admitting: Endocrinology

## 2020-02-29 ENCOUNTER — Telehealth: Payer: Self-pay

## 2020-02-29 DIAGNOSIS — E785 Hyperlipidemia, unspecified: Secondary | ICD-10-CM

## 2020-02-29 DIAGNOSIS — Z951 Presence of aortocoronary bypass graft: Secondary | ICD-10-CM

## 2020-02-29 MED ORDER — ATORVASTATIN CALCIUM 80 MG PO TABS
80.0000 mg | ORAL_TABLET | Freq: Every day | ORAL | 3 refills | Status: DC
Start: 2020-02-29 — End: 2020-10-08

## 2020-02-29 NOTE — Telephone Encounter (Signed)
Spoke to patient lab results given.Advised to increase atorvastatin to 80 mg daily.Repeat fasting lipid panel in 2 months.Lab order mailed.

## 2020-03-12 ENCOUNTER — Other Ambulatory Visit: Payer: Self-pay

## 2020-03-12 DIAGNOSIS — E785 Hyperlipidemia, unspecified: Secondary | ICD-10-CM

## 2020-03-12 DIAGNOSIS — Z951 Presence of aortocoronary bypass graft: Secondary | ICD-10-CM

## 2020-03-22 ENCOUNTER — Other Ambulatory Visit: Payer: Self-pay | Admitting: Endocrinology

## 2020-04-11 ENCOUNTER — Other Ambulatory Visit: Payer: Self-pay | Admitting: Endocrinology

## 2020-04-13 ENCOUNTER — Other Ambulatory Visit: Payer: Self-pay | Admitting: Cardiovascular Disease

## 2020-04-23 ENCOUNTER — Other Ambulatory Visit (INDEPENDENT_AMBULATORY_CARE_PROVIDER_SITE_OTHER): Payer: Managed Care, Other (non HMO)

## 2020-04-23 ENCOUNTER — Other Ambulatory Visit: Payer: Self-pay

## 2020-04-23 DIAGNOSIS — E1165 Type 2 diabetes mellitus with hyperglycemia: Secondary | ICD-10-CM | POA: Diagnosis not present

## 2020-04-23 LAB — BASIC METABOLIC PANEL WITH GFR
BUN: 12 mg/dL (ref 6–23)
CO2: 27 meq/L (ref 19–32)
Calcium: 11 mg/dL — ABNORMAL HIGH (ref 8.4–10.5)
Chloride: 104 meq/L (ref 96–112)
Creatinine, Ser: 0.76 mg/dL (ref 0.40–1.20)
GFR: 95.77 mL/min (ref >60.00)
Glucose, Bld: 143 mg/dL — ABNORMAL HIGH (ref 70–99)
Potassium: 4.2 meq/L (ref 3.5–5.1)
Sodium: 139 meq/L (ref 135–145)

## 2020-04-23 LAB — HEMOGLOBIN A1C: Hgb A1c MFr Bld: 7.3 % — ABNORMAL HIGH (ref 4.6–6.5)

## 2020-04-25 ENCOUNTER — Encounter: Payer: Self-pay | Admitting: Endocrinology

## 2020-04-25 ENCOUNTER — Ambulatory Visit: Payer: Managed Care, Other (non HMO) | Admitting: Endocrinology

## 2020-04-25 ENCOUNTER — Other Ambulatory Visit: Payer: Self-pay

## 2020-04-25 VITALS — BP 102/78 | HR 80 | Ht 60.0 in | Wt 182.4 lb

## 2020-04-25 DIAGNOSIS — E1165 Type 2 diabetes mellitus with hyperglycemia: Secondary | ICD-10-CM | POA: Diagnosis not present

## 2020-04-25 NOTE — Progress Notes (Signed)
Patient ID: [name] [name], female   DOB: 13-Sep-1966, 54 y.o.   MRN: [id number]              Chief complaint: Endocrinology follow-up  History of Present Illness:  DIABETES type II  She was initially diagnosed in 2017  Current management, blood sugar readings, problems identified:  Medication regimen: Metformin ER 750 mg twice daily, Rybelsus 7 mg in a.m.  A1c was last 8.5, now 7.3.   Her Rybelsus was increased to 7 mg in 5/21  With this she has increased satiety  However glyburide was stopped, was on 2.5 mg in the evening  With this she still has tendency to slightly high fasting readings but not checking these much  On her initial consultation in 01/2020 she was started on Rybelsus 3 mg daily with a sample  She is tolerating the extended release Metformin now  Not doing as many readings after dinner but because of her insurance provided meter difficult to analyze her readings, highest reading was 186 after a large breakfast  Has lost another 4 pounds since her last visit  No side effects from Rybelsus 7 mg  Last consultation with dietitian: 02/22/2020  Blood sugar readings by download   AVERAGE before meals 133, range 103-146 After meal average 113-186 with average 144 Overall average 140   Previous blood sugars by recall:  PRE-MEAL Fasting Lunch Dinner Bedtime Overall  Glucose range: 228 130 200 200   Mean/median:        Wt Readings from Last 3 Encounters:  04/25/20 182 lb 6.4 oz (82.7 kg)  02/22/20 186 lb 6.4 oz (84.6 kg)  02/22/20 186 lb 3.2 oz (84.5 kg)    Lab Results  Component Value Date   HGBA1C 7.3 (H) 04/23/2020   HGBA1C 8.5 (A) 01/25/2020   HGBA1C 8.3 (A) 07/26/2019   Lab Results  Component Value Date   LDLCALC 104 (H) 02/21/2020   CREATININE 0.76 04/23/2020    HYPERCALCEMIA:   Review of records show that she had a high calcium of 11.2 done in October 2017 The patient thinks that she has had higher calcium before also but no  records are available Apparently at some point she was taking HCTZ and this was stopped with some improvement in calcium  PTH level from unknown lab 29 done in 10/17 and subsequently 32 in 8/19  RECENT history: Her serum calcium has been mostly high and ranging between 10.5 -11.3 since 12/18 This is now 11  Last  vitamin D level has improved  Has been asymptomatic as before, has no history of bone pain, no history of fractures or kidney stones No osteoporosis on previous bone density from 09/2017  Lab Results  Component Value Date   CALCIUM 11.0 (H) 04/23/2020   CALCIUM 10.7 (H) 01/18/2020   CALCIUM 11.2 (H) 07/24/2019   CALCIUM 10.5 03/03/2019   CALCIUM 11.1 (H) 11/22/2018   CALCIUM 11.3 (H) 07/22/2018   CALCIUM 10.9 (H) 05/23/2018   CALCIUM 11.1 (H) 10/01/2017   CALCIUM 9.3 08/03/2017   Lab Results  Component Value Date   PTH 32 05/23/2018   CALCIUM 11.0 (H) 04/23/2020   CAION 1.31 07/31/2017   PHOS 3.2 10/01/2017       VITAMIN D deficiency:  She has been on vitamin D supplements for persistently low levels below 20 since at least 2018  she is taking vitamin D3, 2000 units daily in addition to her prescription 50,000 units weekly This was started on her visit  in 5/20 and her level at that time was about 20  Vitamin D level is consistently improved and last at 36  Lab Results  Component Value Date   VD25OH 35.56 01/18/2020   VD25OH 36.42 07/24/2019   VD25OH 19.97 (L) 03/03/2019   VD25OH 11.73 (L) 11/22/2018   VD25OH 11.46 (L) 05/23/2018        Allergies as of 04/25/2020      Reactions   Penicillins Hives   Has patient had a PCN reaction causing immediate rash, facial/tongue/throat swelling, SOB or lightheadedness with hypotension: unkn Has patient had a PCN reaction causing severe rash involving mucus membranes or skin necrosis: unkn Has patient had a PCN reaction that required hospitalization: no Has patient had a PCN reaction occurring within the last 10  years: no If all of the above answers are "NO", then may proceed with Cephalosporin use.      Medication List       Accurate as of April 25, 2020  3:55 PM. If you have any questions, ask your nurse or doctor.        STOP taking these medications   glyBURIDE 5 MG tablet Commonly known as: DIABETA Stopped by: Reather Littler, MD     TAKE these medications   amLODipine 10 MG tablet Commonly known as: NORVASC TAKE 1 TABLET BY MOUTH EVERY DAY   aspirin 81 MG EC tablet Take 1 tablet (81 mg total) by mouth daily.   atorvastatin 80 MG tablet Commonly known as: LIPITOR Take 1 tablet (80 mg total) by mouth daily.   metFORMIN 750 MG 24 hr tablet Commonly known as: Glucophage XR Take 1 tablet (750 mg total) by mouth 2 (two) times daily after a meal.   metoprolol tartrate 100 MG tablet Commonly known as: LOPRESSOR TAKE 1 TABLET BY MOUTH TWICE A DAY   Rybelsus 7 MG Tabs Generic drug: Semaglutide Take 1 tablet by mouth daily before breakfast. Take 30 minutes before breakfast with water   valsartan 160 MG tablet Commonly known as: DIOVAN TAKE 1 TABLET BY MOUTH AT BEDTIME   Vitamin D (Ergocalciferol) 1.25 MG (50000 UNIT) Caps capsule Commonly known as: DRISDOL TAKE 1 CAPSULE BY MOUTH ONE TIME PER WEEK   Vitamin D 50 MCG (2000 UT) tablet Take 2,000 Units by mouth every 14 (fourteen) days.       Allergies:  Allergies  Allergen Reactions  . Penicillins Hives    Has patient had a PCN reaction causing immediate rash, facial/tongue/throat swelling, SOB or lightheadedness with hypotension: unkn Has patient had a PCN reaction causing severe rash involving mucus membranes or skin necrosis: unkn Has patient had a PCN reaction that required hospitalization: no Has patient had a PCN reaction occurring within the last 10 years: no If all of the above answers are "NO", then may proceed with Cephalosporin use.     Past Medical History:  Diagnosis Date  . Coronary artery disease   .  Diabetes mellitus without complication (HCC)   . Hyperlipidemia   . Hypertension   . Obesity     Past Surgical History:  Procedure Laterality Date  . CARDIAC CATHETERIZATION    . CESAREAN SECTION    . CORONARY ARTERY BYPASS GRAFT N/A 07/30/2017   Procedure: CORONARY ARTERY BYPASS GRAFTING (CABG)  x four, using right internal mammary artery, right greater saphenous vein, left radial artery;  Surgeon: Loreli Slot, MD;  Location: [facility] OR;  Service: Open Heart Surgery;  Laterality: N/A;  . INTRAOPERATIVE TRANSESOPHAGEAL ECHOCARDIOGRAM  N/A 07/30/2017   Procedure: INTRAOPERATIVE TRANSESOPHAGEAL ECHOCARDIOGRAM;  Surgeon: Loreli Slot, MD;  Location: [facility] OR;  Service: Open Heart Surgery;  Laterality: N/A;  . LEFT HEART CATH AND CORONARY ANGIOGRAPHY N/A 07/29/2017   Procedure: LEFT HEART CATH AND CORONARY ANGIOGRAPHY;  Surgeon: Swazil[place], Peter M, MD;  Location: [facility] INVASIVE CV LAB;  Service: Cardiovascular;  Laterality: N/A;  . LEFT HEART CATH AND CORS/GRAFTS ANGIOGRAPHY N/A 07/25/2018   Procedure: LEFT HEART CATH AND CORS/GRAFTS ANGIOGRAPHY;  Surgeon: Runell Gess, MD;  Location: [facility] INVASIVE CV LAB;  Service: Cardiovascular;  Laterality: N/A;  . PARTIAL HYSTERECTOMY  2005  . RADIAL ARTERY HARVEST Left 07/30/2017   Procedure: RADIAL ARTERY HARVEST;  Surgeon: Loreli Slot, MD;  Location: [facility] OR;  Service: Open Heart Surgery;  Laterality: Left;    Family History  Problem Relation Age of Onset  . Hypertension Other     Social History:  reports that she has never smoked. She has never used smokeless tobacco. She reports current alcohol use. She reports that she does not use drugs.  Review of Systems  HYPERCHOLESTEROLEMIA: Followed by PCP/cardiologist on treatment with Lipitor 40 mg She does have a history of CAD making her LDL target 70  Lab Results  Component Value Date   CHOL 161 02/21/2020   CHOL 156 11/23/2017   CHOL 237 (H) 07/28/2017   Lab Results    Component Value Date   HDL 36 (L) 02/21/2020   HDL 36 (L) 11/23/2017   HDL 36 (L) 07/28/2017   Lab Results  Component Value Date   LDLCALC 104 (H) 02/21/2020   LDLCALC 92 11/23/2017   LDLCALC 167 (H) 07/28/2017   Lab Results  Component Value Date   TRIG 113 02/21/2020   TRIG 140 11/23/2017   TRIG 169 (H) 07/28/2017   Lab Results  Component Value Date   CHOLHDL 4.5 (H) 02/21/2020   CHOLHDL 4.3 11/23/2017   CHOLHDL 6.6 07/28/2017   No results found for: LDLDIRECT     EXAM:  BP 102/78 (BP Location: Right Arm, Patient Position: Sitting, Cuff Size: Large)   Pulse 80   Ht 5' (1.524 m)   Wt 182 lb 6.4 oz (82.7 kg)   SpO2 96%   BMI 35.62 kg/m    Assessment/Plan:   DIABETES:  Her A1c is 7.3  She is now on a regimen of Rybelsus 7 mg, no glyburide and Metformin ER  Her Rybelsus was increased about 2 months ago and glyburide stopped Although her fasting readings appear to be somewhat higher these are not checked very often, lab glucose 143 She has been seen by the dietitian Recently may not have been exercising as much with her walking routine Weight has gradually come down and she is getting some satiety from Rybelsus  Since likely she will continue to have better blood sugars and has significant satiety from Rybelsus will not increase the dose as yet.  Hypercalcemia is stable  Lipids: Followed by cardiologist and PCP as above  Reather Littler 04/25/2020, 3:55 PM     Note: This office note was prepared with Dragon voice recognition system technology. Any transcriptional errors that result from this process are unintentional.

## 2020-04-25 NOTE — Patient Instructions (Signed)
Check blood sugars on waking up days 3 a week ° °Also check blood sugars about 2 hours after meals and do this after different meals by rotation ° °Recommended blood sugar levels on waking up are 90-130 and about 2 hours after meal is 130-160 ° °Please bring your blood sugar monitor to each visit, thank you ° °

## 2020-04-30 ENCOUNTER — Encounter: Payer: Managed Care, Other (non HMO) | Attending: Endocrinology | Admitting: Dietician

## 2020-04-30 ENCOUNTER — Other Ambulatory Visit: Payer: Self-pay

## 2020-04-30 ENCOUNTER — Encounter: Payer: Self-pay | Admitting: Dietician

## 2020-04-30 DIAGNOSIS — E119 Type 2 diabetes mellitus without complications: Secondary | ICD-10-CM | POA: Diagnosis present

## 2020-04-30 NOTE — Progress Notes (Signed)
Diabetes Self-Management Education  Visit Type:  Follow-up  Appt. Start Time: 2:10pm   Appt. End Time: 2:40pm  04/30/2020  [name], identified by name and date of birth, is a 54 y.o. female with a diagnosis of Diabetes: Type 2.     ASSESSMENT  Weight 182 lb 4.8 oz (82.7 kg). Body mass index is 35.6 kg/m.   Patient arrives having lost ~4 lbs and lowering A1c to 7.3% from 8.5%. Patient states she has focused on carb counting and incorporating balanced snacks throughout the day. Typical meal pattern is 3 meals plus 1-2 snacks per day. Drinks mostly water as well as diet drinks. States she also drinks alcohol but will eat food as to avoid hypoglycemia.  States her legs have been aching lately and she sometimes has an off taste (metallic) in her mouth.     Diabetes Self-Management Education - 04/30/20 1419      Complications   Last HgB A1C per patient/outside source 7.3 %   04/23/20     Dietary Intake   Breakfast boiled egg + oatmeal   or tortilla + egg; or bacon + egg + fruit; or pancake + egg + fruit   Snack (morning) grapes + cheese    Lunch [employer] nuggets + fruit   or sandwich; or salad   Snack (afternoon) 1 serving chips    Dinner air fried chicken + vegetables + rice    Beverage(s) water, diet lemonade, diet soda occasionally, wine      Subsequent Visit   Since your last visit have you continued or begun to take your medications as prescribed? Yes    Since your last visit have you had your blood pressure checked? Yes    Is your most recent blood pressure lower, unchanged, or higher since your last visit? Unchanged    Since your last visit have you experienced any weight changes? Loss    Weight Loss (lbs) 4    Since your last visit, are you checking your blood glucose at least once a day? Yes           Learning Objective:  Patient will have a greater understanding of diabetes self-management. Patient education plan is to attend individual and/or group sessions  per assessed needs and concerns.   Plan:   Patient Instructions  Remember to include more fiber throughout the day  Whole fruits (especially with the skin)   Vegetables   Beans  Flaxseeds  Oats/ oatmeal  Whole grain bread  Continue to eat small frequent meals and snacks throughout the day, great job with this! Remember to prioritize protein, this will help with weight management and stabilize your blood sugar.   Continue drinking lots of water and no sugar added beverages, good job with this as well!   Expected Outcomes:  Demonstrated interest in learning. Expect positive outcomes  If problems or questions, patient to contact team via:  Phone and Email  Future DSME appointment: - 3-4 months

## 2020-04-30 NOTE — Patient Instructions (Signed)
Remember to include more fiber throughout the day  Whole fruits (especially with the skin)   Vegetables   Beans  Flaxseeds  Oats/ oatmeal  Whole grain bread  Continue to eat small frequent meals and snacks throughout the day, great job with this! Remember to prioritize protein, this will help with weight management and stabilize your blood sugar.   Continue drinking lots of water and no sugar added beverages, good job with this as well!

## 2020-05-03 ENCOUNTER — Other Ambulatory Visit: Payer: Self-pay | Admitting: Endocrinology

## 2020-05-03 NOTE — Telephone Encounter (Signed)
Refill or deny? 

## 2020-05-03 NOTE — Telephone Encounter (Signed)
Okay to refill? 

## 2020-05-27 ENCOUNTER — Telehealth: Payer: Self-pay | Admitting: Endocrinology

## 2020-05-27 NOTE — Telephone Encounter (Signed)
Patient called again to follow up on her call from earlier today.

## 2020-05-27 NOTE — Telephone Encounter (Signed)
Patient called regarding Rybelsus dosing instructions and missed doses.  Patient requesting a call back to [phone]

## 2020-05-28 ENCOUNTER — Other Ambulatory Visit: Payer: Self-pay | Admitting: *Deleted

## 2020-05-28 ENCOUNTER — Other Ambulatory Visit: Payer: Self-pay

## 2020-05-28 MED ORDER — RYBELSUS 7 MG PO TABS: 1.0000 | ORAL_TABLET | Freq: Every day | ORAL | 0 refills | Status: DC

## 2020-05-28 NOTE — Telephone Encounter (Signed)
She can get a 30-day prescription from the local drugstore

## 2020-05-28 NOTE — Telephone Encounter (Signed)
Called pt and she stated that she was supposed to be getting a 90 day supply from [facility], but an Rx was never sent to that pharmacy.  I have sent this prescription to [facility], and a 90 day supply will cost $87.  A 30 day supply will cost pt over $500.  Could pt be offered a sample of Ryblesus until the new prescribed Rybelsus that was sent to [facility] arrives.

## 2020-05-28 NOTE — Telephone Encounter (Signed)
We do not have samples of 7 mg, she will have to wait till her prescription comes

## 2020-05-28 NOTE — Telephone Encounter (Signed)
Patient has not yet received Rybelsus (mail order). Blood sugars in the 130's. Should patient take something else in the interim while waiting for the Rybelsus? Please advise.

## 2020-05-29 NOTE — Telephone Encounter (Signed)
Called pt and gave her MD message. Pt verbalized understanding. 

## 2020-06-03 ENCOUNTER — Other Ambulatory Visit: Payer: Self-pay | Admitting: Endocrinology

## 2020-06-03 DIAGNOSIS — E559 Vitamin D deficiency, unspecified: Secondary | ICD-10-CM

## 2020-06-03 NOTE — Telephone Encounter (Signed)
Do you wish to refill this prescription? Please Advise.

## 2020-06-03 NOTE — Telephone Encounter (Signed)
Ok to refill 

## 2020-06-04 ENCOUNTER — Other Ambulatory Visit: Payer: Self-pay | Admitting: *Deleted

## 2020-06-04 MED ORDER — VITAMIN D (ERGOCALCIFEROL) 1.25 MG (50000 UNIT) PO CAPS: ORAL_CAPSULE | ORAL | 0 refills | Status: DC

## 2020-07-29 ENCOUNTER — Other Ambulatory Visit: Payer: Self-pay | Admitting: Gynecology

## 2020-07-29 DIAGNOSIS — Z1231 Encounter for screening mammogram for malignant neoplasm of breast: Secondary | ICD-10-CM

## 2020-07-30 ENCOUNTER — Other Ambulatory Visit: Payer: Managed Care, Other (non HMO)

## 2020-08-01 ENCOUNTER — Ambulatory Visit: Payer: Managed Care, Other (non HMO) | Admitting: Endocrinology

## 2020-08-12 ENCOUNTER — Encounter: Payer: Managed Care, Other (non HMO) | Attending: Endocrinology | Admitting: Dietician

## 2020-08-14 ENCOUNTER — Other Ambulatory Visit: Payer: Self-pay | Admitting: Cardiovascular Disease

## 2020-08-15 ENCOUNTER — Other Ambulatory Visit (INDEPENDENT_AMBULATORY_CARE_PROVIDER_SITE_OTHER): Payer: Managed Care, Other (non HMO)

## 2020-08-15 ENCOUNTER — Other Ambulatory Visit: Payer: Self-pay

## 2020-08-15 DIAGNOSIS — E559 Vitamin D deficiency, unspecified: Secondary | ICD-10-CM

## 2020-08-15 DIAGNOSIS — E1165 Type 2 diabetes mellitus with hyperglycemia: Secondary | ICD-10-CM | POA: Diagnosis not present

## 2020-08-15 LAB — MICROALBUMIN / CREATININE URINE RATIO
Creatinine,U: 148 mg/dL
Microalb Creat Ratio: 2 mg/g (ref 0.0–30.0)
Microalb, Ur: 3 mg/dL — ABNORMAL HIGH (ref 0.0–1.9)

## 2020-08-15 LAB — BASIC METABOLIC PANEL
BUN: 14 mg/dL (ref 6–23)
CO2: 24 mEq/L (ref 19–32)
Calcium: 11.4 mg/dL — ABNORMAL HIGH (ref 8.4–10.5)
Chloride: 102 mEq/L (ref 96–112)
Creatinine, Ser: 0.73 mg/dL (ref 0.40–1.20)
GFR: 92.95 mL/min (ref >60.00)
Glucose, Bld: 102 mg/dL — ABNORMAL HIGH (ref 70–99)
Potassium: 4.1 mEq/L (ref 3.5–5.1)
Sodium: 137 mEq/L (ref 135–145)

## 2020-08-15 LAB — HEMOGLOBIN A1C: Hgb A1c MFr Bld: 7.4 % — ABNORMAL HIGH (ref 4.6–6.5)

## 2020-08-15 LAB — VITAMIN D 25 HYDROXY (VIT D DEFICIENCY, FRACTURES): VITD: 29.29 ng/mL — ABNORMAL LOW (ref 30.00–100.00)

## 2020-08-16 ENCOUNTER — Other Ambulatory Visit: Payer: Self-pay

## 2020-08-16 MED ORDER — VITAMIN D (ERGOCALCIFEROL) 1.25 MG (50000 UNIT) PO CAPS: ORAL_CAPSULE | ORAL | 0 refills | Status: DC

## 2020-08-19 ENCOUNTER — Encounter: Payer: Self-pay | Admitting: Endocrinology

## 2020-08-19 ENCOUNTER — Ambulatory Visit: Payer: Managed Care, Other (non HMO) | Admitting: Endocrinology

## 2020-08-19 ENCOUNTER — Other Ambulatory Visit: Payer: Self-pay

## 2020-08-19 VITALS — BP 118/68 | HR 55 | Ht 60.0 in | Wt 175.0 lb

## 2020-08-19 DIAGNOSIS — E1165 Type 2 diabetes mellitus with hyperglycemia: Secondary | ICD-10-CM

## 2020-08-19 DIAGNOSIS — E7800 Pure hypercholesterolemia, unspecified: Secondary | ICD-10-CM | POA: Diagnosis not present

## 2020-08-19 DIAGNOSIS — Z23 Encounter for immunization: Secondary | ICD-10-CM | POA: Diagnosis not present

## 2020-08-19 DIAGNOSIS — E213 Hyperparathyroidism, unspecified: Secondary | ICD-10-CM

## 2020-08-19 NOTE — Progress Notes (Signed)
Patient ID: [name] [name], female   DOB: 03-20-1966, 54 y.o.   MRN: [id number]              Chief complaint: Endocrinology follow-up  History of Present Illness:  DIABETES type II  She was initially diagnosed in 2017  Current management, blood sugar readings, problems identified:  Medication regimen: Metformin ER 750 mg twice daily, Rybelsus 7 mg in a.m.  A1c was as high as 8.5, now 7.4 compared to 7.3.   Her blood sugars are staying overall fairly good although A1c is higher than expected with her home readings still averaging about 138  Not clear if her meter is accurate since fasting glucose was 102 but she has not had any recent fasting readings to compare with  As before difficult to get information from her [place] meter  She has worked with a dietitian at times and she thinks she is doing better most of the time and appears fairly committed to watching her diet  She has lost 7 pounds  Again now nausea from 7 mg Rybelsus  She is trying to go to the gym at least 3 days a week now  Only occasionally may have a reading around 180 postprandially Last consultation with dietitian: 7/21  Blood sugar readings by review of her meter   PRE-MEAL Fasting Lunch Dinner Bedtime Overall  Glucose range:  118  126, 133  112  123  112-188  Mean/median:      138   POST-MEAL PC Breakfast PC Lunch PC Dinner  Glucose range: 188  148-185  120  Mean/median:       AVERAGE before meals 133, range 103-146 After meal average 113-186 with average 144 Overall average 140   Wt Readings from Last 3 Encounters:  08/19/20 175 lb (79.4 kg)  04/30/20 182 lb 4.8 oz (82.7 kg)  04/25/20 182 lb 6.4 oz (82.7 kg)    Lab Results  Component Value Date   HGBA1C 7.4 (H) 08/15/2020   HGBA1C 7.3 (H) 04/23/2020   HGBA1C 8.5 (A) 01/25/2020   Lab Results  Component Value Date   MICROALBUR 3.0 (H) 08/15/2020   LDLCALC 104 (H) 02/21/2020   CREATININE 0.73 08/15/2020     HYPERCALCEMIA:  Review of records show that she had a high calcium of 11.2 done in October 2017 The patient thinks that she has had higher calcium before also but no records are available Apparently at some point she was taking HCTZ and this was stopped with some improvement in calcium  PTH level from unknown lab 29 done in 10/17 and subsequently 32 in 8/19  RECENT history: Her serum calcium has been generally high and ranging between 10.5 -11.3 since 12/18 This is now 11.4  Has been asymptomatic as before with no history of fractures or kidney stones No osteoporosis on previous bone density from 09/2017  Lab Results  Component Value Date   CALCIUM 11.4 (H) 08/15/2020   CALCIUM 11.0 (H) 04/23/2020   CALCIUM 10.7 (H) 01/18/2020   CALCIUM 11.2 (H) 07/24/2019   CALCIUM 10.5 03/03/2019   CALCIUM 11.1 (H) 11/22/2018   CALCIUM 11.3 (H) 07/22/2018   CALCIUM 10.9 (H) 05/23/2018   CALCIUM 11.1 (H) 10/01/2017   Lab Results  Component Value Date   PTH 32 05/23/2018   CALCIUM 11.4 (H) 08/15/2020   CAION 1.31 07/31/2017   PHOS 3.2 10/01/2017       VITAMIN D deficiency:  She has been on vitamin D supplements for persistently low  levels below 20 since at least 2018  Previously she was taking vitamin D3, 2000 units but has been out of it for about 1 month  Also in addition to her OTC supplement she is taking prescription 50,000 units weekly This was started on her visit in 5/20 and her level at that time was about 20  Vitamin D level is recently lower  Lab Results  Component Value Date   VD25OH 29.29 (L) 08/15/2020   VD25OH 35.56 01/18/2020   VD25OH 36.42 07/24/2019   VD25OH 19.97 (L) 03/03/2019   VD25OH 11.73 (L) 11/22/2018        Allergies as of 08/19/2020      Reactions   Penicillins Hives   Has patient had a PCN reaction causing immediate rash, facial/tongue/throat swelling, SOB or lightheadedness with hypotension: unkn Has patient had a PCN reaction causing  severe rash involving mucus membranes or skin necrosis: unkn Has patient had a PCN reaction that required hospitalization: no Has patient had a PCN reaction occurring within the last 10 years: no If all of the above answers are "NO", then may proceed with Cephalosporin use.      Medication List       Accurate as of August 19, 2020  9:18 AM. If you have any questions, ask your nurse or doctor.        amLODipine 10 MG tablet Commonly known as: NORVASC TAKE 1 TABLET BY MOUTH EVERY DAY   aspirin 81 MG EC tablet Take 1 tablet (81 mg total) by mouth daily.   atorvastatin 80 MG tablet Commonly known as: LIPITOR Take 1 tablet (80 mg total) by mouth daily.   metFORMIN 750 MG 24 hr tablet Commonly known as: Glucophage XR Take 1 tablet (750 mg total) by mouth 2 (two) times daily after a meal.   metoprolol tartrate 100 MG tablet Commonly known as: LOPRESSOR TAKE 1 TABLET BY MOUTH TWICE A DAY   Rybelsus 7 MG Tabs Generic drug: Semaglutide Take 1 tablet by mouth daily before breakfast. Take 30 minutes before breakfast with water   valsartan 160 MG tablet Commonly known as: DIOVAN TAKE 1 TABLET BY MOUTH AT BEDTIME   Vitamin D (Ergocalciferol) 1.25 MG (50000 UNIT) Caps capsule Commonly known as: DRISDOL TAKE 1 CAPSULE BY MOUTH ONE TIME PER WEEK   Vitamin D 50 MCG (2000 UT) tablet Take 2,000 Units by mouth every 14 (fourteen) days.       Allergies:  Allergies  Allergen Reactions  . Penicillins Hives    Has patient had a PCN reaction causing immediate rash, facial/tongue/throat swelling, SOB or lightheadedness with hypotension: unkn Has patient had a PCN reaction causing severe rash involving mucus membranes or skin necrosis: unkn Has patient had a PCN reaction that required hospitalization: no Has patient had a PCN reaction occurring within the last 10 years: no If all of the above answers are "NO", then may proceed with Cephalosporin use.     Past Medical History:   Diagnosis Date  . Coronary artery disease   . Diabetes mellitus without complication (HCC)   . Hyperlipidemia   . Hypertension   . Obesity     Past Surgical History:  Procedure Laterality Date  . CARDIAC CATHETERIZATION    . CESAREAN SECTION    . CORONARY ARTERY BYPASS GRAFT N/A 07/30/2017   Procedure: CORONARY ARTERY BYPASS GRAFTING (CABG)  x four, using right internal mammary artery, right greater saphenous vein, left radial artery;  Surgeon: Loreli Slot, MD;  Location:  [facility] OR;  Service: Open Heart Surgery;  Laterality: N/A;  . INTRAOPERATIVE TRANSESOPHAGEAL ECHOCARDIOGRAM N/A 07/30/2017   Procedure: INTRAOPERATIVE TRANSESOPHAGEAL ECHOCARDIOGRAM;  Surgeon: Loreli Slot, MD;  Location: [facility] OR;  Service: Open Heart Surgery;  Laterality: N/A;  . LEFT HEART CATH AND CORONARY ANGIOGRAPHY N/A 07/29/2017   Procedure: LEFT HEART CATH AND CORONARY ANGIOGRAPHY;  Surgeon: Swazil[place], Peter M, MD;  Location: [facility] INVASIVE CV LAB;  Service: Cardiovascular;  Laterality: N/A;  . LEFT HEART CATH AND CORS/GRAFTS ANGIOGRAPHY N/A 07/25/2018   Procedure: LEFT HEART CATH AND CORS/GRAFTS ANGIOGRAPHY;  Surgeon: Runell Gess, MD;  Location: [facility] INVASIVE CV LAB;  Service: Cardiovascular;  Laterality: N/A;  . PARTIAL HYSTERECTOMY  2005  . RADIAL ARTERY HARVEST Left 07/30/2017   Procedure: RADIAL ARTERY HARVEST;  Surgeon: Loreli Slot, MD;  Location: [facility] OR;  Service: Open Heart Surgery;  Laterality: Left;    Family History  Problem Relation Age of Onset  . Hypertension Other     Social History:  reports that she has never smoked. She has never used smokeless tobacco. She reports current alcohol use. She reports that she does not use drugs.  Review of Systems  HYPERCHOLESTEROLEMIA: Followed by PCP/cardiologist on treatment with Lipitor 80 mg  She does have a history of CAD making her LDL target 70 but it has been consistently high  She thinks she is getting leg pains from trying  80 mg Lipitor as recommended by her cardiologist, no future appointment currently made  Lab Results  Component Value Date   CHOL 161 02/21/2020   CHOL 156 11/23/2017   CHOL 237 (H) 07/28/2017   Lab Results  Component Value Date   HDL 36 (L) 02/21/2020   HDL 36 (L) 11/23/2017   HDL 36 (L) 07/28/2017   Lab Results  Component Value Date   LDLCALC 104 (H) 02/21/2020   LDLCALC 92 11/23/2017   LDLCALC 167 (H) 07/28/2017   Lab Results  Component Value Date   TRIG 113 02/21/2020   TRIG 140 11/23/2017   TRIG 169 (H) 07/28/2017   Lab Results  Component Value Date   CHOLHDL 4.5 (H) 02/21/2020   CHOLHDL 4.3 11/23/2017   CHOLHDL 6.6 07/28/2017   No results found for: LDLDIRECT    EXAM:  BP 118/68   Pulse (!) 55   Ht 5' (1.524 m)   Wt 175 lb (79.4 kg)   SpO2 96%   BMI 34.18 kg/m    Assessment/Plan:   DIABETES type 2, non-insulin-dependent:  Her A1c is 7.4  She is continuing on a regimen of Rybelsus 7 mg in the morning and Metformin ER  A1c is higher than expected for her blood sugar average of about 140 She has lost weight and has followed up with the dietitian Recently exercising with going to the gym  Since her blood sugars are not consistently high will not need to adjust her medications as yet but may consider 14 mg of Rybelsus if needed She will try to periodically check readings after meals and fasting by rotation  Hypercalcemia is variable and now 11.4, will need to monitor for any further increase   HYPERCHOLESTEROLEMIA: Since she cannot tolerate 80 mg Lipitor well with muscle aches may do better with 40 mg Lipitor with addition of Zetia and will send message to cardiologist regarding this  Flu vaccine given  Reather Littler 08/19/2020, 9:18 AM     Note: This office note was prepared with Dragon voice recognition system technology. Any transcriptional  errors that result from this process are unintentional.

## 2020-08-19 NOTE — Patient Instructions (Addendum)
Lipitor 1/2 and conseder Ezetemibe

## 2020-08-23 ENCOUNTER — Encounter: Payer: Managed Care, Other (non HMO) | Attending: Endocrinology | Admitting: Dietician

## 2020-08-23 ENCOUNTER — Telehealth: Payer: Self-pay

## 2020-08-23 ENCOUNTER — Other Ambulatory Visit: Payer: Self-pay

## 2020-08-23 ENCOUNTER — Encounter: Payer: Self-pay | Admitting: Dietician

## 2020-08-23 DIAGNOSIS — E119 Type 2 diabetes mellitus without complications: Secondary | ICD-10-CM | POA: Insufficient documentation

## 2020-08-23 NOTE — Telephone Encounter (Signed)
Spoke to patient Dr.[name] advised to see pharmacist in [facility]  to discuss Repatha.Advised scheduler will call back with appointment.

## 2020-08-23 NOTE — Progress Notes (Signed)
Diabetes Self-Management Education  Visit Type: Follow-up  08/23/2020  [name], identified by name and date of birth, is a 54 y.o. female with a diagnosis of Diabetes: Type 2.   ASSESSMENT  Weight 176 lb 9.6 oz (80.1 kg). Body mass index is 34.49 kg/m.   Patient states she has been working on eating more raw vegetables and fruit. States she has been going to the gym throughout the week and would like to continue doing this.    Diabetes Self-Management Education - 08/23/20 1123      Visit Information   Visit Type Follow-up      Dietary Intake   Breakfast egg burrito with cheese    Snack (morning) apple    Lunch [employer] grilled chicken salad    Dinner salmon + broccoli   or taco   Beverage(s) water, 1/2 cup coffee per day + hazelnut sweetener + Splenda      Exercise   Exercise Type Moderate (swimming / aerobic walking)   going to the gym throughout the week   How many days per week to you exercise? 3    How many minutes per day do you exercise? 30    Total minutes per week of exercise 90      Patient Education   Previous Diabetes Education Yes (please comment)      Individualized Goals (developed by patient)   Nutrition General guidelines for healthy choices and portions discussed    Physical Activity Exercise 3-5 times per week      Outcomes   Expected Outcomes Demonstrated interest in learning. Expect positive outcomes    Future DMSE 3-4 months      Subsequent Visit   Since your last visit have you had your blood pressure checked? No    Since your last visit have you experienced any weight changes? No change           Individualized Plan for Diabetes Self-Management Training:   Learning Objective:  Patient will have a greater understanding of diabetes self-management. Patient education plan is to attend individual and/or group sessions per assessed needs and concerns.   Plan:  Patient Instructions   Continue to go to the gym at least 3 times  per week, great job with this!!  Remember to eat plenty of fiber of throughout your day, see the handout for sources of fiber.   Aim to drink plenty of water throughout your day... the goal is 64 ounces or more. This would be 4 bottles of water, or 8 cups.    Expected Outcomes:  Demonstrated interest in learning. Expect positive outcomes  Education material provided: Types of Fiber  If problems or questions, patient to contact team via:  Phone and Email  Future DSME appointment: 3-4 months

## 2020-08-23 NOTE — Patient Instructions (Signed)
   Continue to go to the gym at least 3 times per week, great job with this!!  Remember to eat plenty of fiber of throughout your day, see the handout for sources of fiber.   Aim to drink plenty of water throughout your day... the goal is 64 ounces or more. This would be 4 bottles of water, or 8 cups.

## 2020-08-26 ENCOUNTER — Other Ambulatory Visit: Payer: Self-pay

## 2020-08-26 DIAGNOSIS — Z951 Presence of aortocoronary bypass graft: Secondary | ICD-10-CM

## 2020-08-26 DIAGNOSIS — E785 Hyperlipidemia, unspecified: Secondary | ICD-10-CM

## 2020-08-29 ENCOUNTER — Other Ambulatory Visit: Payer: Self-pay | Admitting: Endocrinology

## 2020-09-17 ENCOUNTER — Ambulatory Visit
Admission: RE | Admit: 2020-09-17 | Discharge: 2020-09-17 | Disposition: A | Discharge status: Home / Self Care | Payer: Managed Care, Other (non HMO) | Source: Ambulatory Visit | Attending: Gynecology | Admitting: Gynecology

## 2020-09-17 ENCOUNTER — Other Ambulatory Visit: Payer: Self-pay

## 2020-09-17 DIAGNOSIS — Z1231 Encounter for screening mammogram for malignant neoplasm of breast: Secondary | ICD-10-CM

## 2020-09-24 ENCOUNTER — Other Ambulatory Visit: Payer: Self-pay

## 2020-09-24 ENCOUNTER — Ambulatory Visit (INDEPENDENT_AMBULATORY_CARE_PROVIDER_SITE_OTHER): Payer: Managed Care, Other (non HMO) | Admitting: Pharmacist

## 2020-09-24 VITALS — BP 126/84 | HR 86 | Resp 15 | Ht 60.0 in | Wt 172.3 lb

## 2020-09-24 DIAGNOSIS — G720 Drug-induced myopathy: Secondary | ICD-10-CM

## 2020-09-24 DIAGNOSIS — T380X5A Adverse effect of glucocorticoids and synthetic analogues, initial encounter: Secondary | ICD-10-CM | POA: Diagnosis not present

## 2020-09-24 DIAGNOSIS — T466X5A Adverse effect of antihyperlipidemic and antiarteriosclerotic drugs, initial encounter: Secondary | ICD-10-CM | POA: Diagnosis not present

## 2020-09-24 DIAGNOSIS — E785 Hyperlipidemia, unspecified: Secondary | ICD-10-CM

## 2020-09-24 NOTE — Progress Notes (Signed)
Patient ID: [name] [name]                 DOB: [birth year]                    MRN: [id number]     HPI: [name] [name] is a 54 y.o. female patient referred to lipid clinic by Dr Allyson Sabal. PMH is significant for hypertension, DM, dyslipidemia, and NSTEMI, and CABG x 3. Ms [name] [name] is familiar with our pharmacist clinic. We worked with her on HTN management in the past. Her BP remains at goal and she denies problems with therapy. Noted her LDL remains above goal while on high dose statin. She presents today to initiate PCSK9i as recommended by her primary cardiologist.   Current Medications:  Atorvastatin 80mg  daily - experiencing some muscle pain but continue to take.  Intolerances: none  LDL goal: < 70mg /dL  Diet: low sodium, low carbohydrate, coffee twice per week. Noted 10lbs weight loss in the last year.  Exercise: ecliptical and stationary bike few time per week  Family History: high cholesterol and HTN from father, high cholesterol from mother  Social History: denies alcohol intake or tobacco use  Labs: 02/21/2020: CHO 161, TG 113, HDL 36, LDL-c 104 (on atorvastatin)  Past Medical History:  Diagnosis Date  . Coronary artery disease   . Diabetes mellitus without complication (HCC)   . Hyperlipidemia   . Hypertension   . Obesity     Current Outpatient Medications on File Prior to Visit  Medication Sig Dispense Refill  . amLODipine (NORVASC) 10 MG tablet TAKE 1 TABLET BY MOUTH EVERY DAY 30 tablet 6  . aspirin 81 MG EC tablet Take 1 tablet (81 mg total) by mouth daily. 30 tablet 0  . atorvastatin (LIPITOR) 80 MG tablet Take 1 tablet (80 mg total) by mouth daily. 90 tablet 3  . Cholecalciferol (VITAMIN D) 50 MCG (2000 UT) tablet Take 2,000 Units by mouth every 14 (fourteen) days.    . metFORMIN (GLUCOPHAGE XR) 750 MG 24 hr tablet Take 1 tablet (750 mg total) by mouth 2 (two) times daily after a meal. 60 tablet 1  . metoprolol tartrate (LOPRESSOR) 100 MG tablet TAKE 1 TABLET BY  MOUTH TWICE A DAY 180 tablet 3  . RYBELSUS 7 MG TABS TAKE 1 TABLET DAILY BEFORE BREAKFAST. TAKE 30 MINUTES BEFORE BREAKFAST WITH WATER. 90 tablet 1  . Vitamin D, Ergocalciferol, (DRISDOL) 1.25 MG (50000 UNIT) CAPS capsule TAKE 1 CAPSULE BY MOUTH ONE TIME PER WEEK 4 capsule 0   No current facility-administered medications on file prior to visit.    Allergies  Allergen Reactions  . Penicillins Hives    Has patient had a PCN reaction causing immediate rash, facial/tongue/throat swelling, SOB or lightheadedness with hypotension: unkn Has patient had a PCN reaction causing severe rash involving mucus membranes or skin necrosis: unkn Has patient had a PCN reaction that required hospitalization: no Has patient had a PCN reaction occurring within the last 10 years: no If all of the above answers are "NO", then may proceed with Cephalosporin use.     Dyslipidemia LDL improved while on atorvastatin but patient is currently experiencing some muscle aches and LDL remains above goal for secondary prevention. Ms Shakir continues to work on positive lifestyle modifications.   Will initiate prior-authorization for Repatha SureClick 140mg  every 14 days. Patient should be able ti use co-pay card with her commercial insurance. Plan to repeatt fasting blood work after 4 doses  of new medication. Due to her current muscle discomfort, I will recommend to decrease atorvastatin to 20mg  daily once we are able to start Repatha therapy.   [name] Severs Rodriguez-Guzman PharmD, BCPS, CPP [facility] [address] [place] 09/27/2020 8:04 AM

## 2020-09-24 NOTE — Patient Instructions (Addendum)
Your Results:             Your most recent labs Goal  Total Cholesterol 161 < 200  Triglycerides 113 < 150  HDL (good cholesterol) 36 > 40  LDL (bad cholesterol) 104 < 70     Medication changes: *START prior-authorization for Repatha SureClick 140mg  every 14 days* *OKAY to decrease atorvastatin to 20mg  daily once Repatha SureClick started*  Lab orders: *2 months after on new cholesterol management therapy*  Clinic phone number: [phone]  Thank you for choosing [facility]

## 2020-09-26 ENCOUNTER — Other Ambulatory Visit: Payer: Self-pay | Admitting: Cardiology

## 2020-09-27 ENCOUNTER — Encounter: Payer: Self-pay | Admitting: Pharmacist

## 2020-09-27 DIAGNOSIS — T466X5A Adverse effect of antihyperlipidemic and antiarteriosclerotic drugs, initial encounter: Secondary | ICD-10-CM | POA: Insufficient documentation

## 2020-09-27 DIAGNOSIS — G720 Drug-induced myopathy: Secondary | ICD-10-CM | POA: Insufficient documentation

## 2020-09-27 DIAGNOSIS — T380X5A Adverse effect of glucocorticoids and synthetic analogues, initial encounter: Secondary | ICD-10-CM | POA: Insufficient documentation

## 2020-09-27 NOTE — Assessment & Plan Note (Signed)
LDL improved while on atorvastatin but patient is currently experiencing some muscle aches and LDL remains above goal for secondary prevention. [name] [name] continues to work on positive lifestyle modifications.   Will initiate prior-authorization for Repatha SureClick 140mg  every 14 days. Patient should be able ti use co-pay card with her commercial insurance. Plan to repeatt fasting blood work after 4 doses of new medication. Due to her current muscle discomfort, I will recommend to decrease atorvastatin to 20mg  daily once we are able to start Repatha therapy.

## 2020-10-08 ENCOUNTER — Telehealth: Payer: Self-pay

## 2020-10-08 MED ORDER — ATORVASTATIN CALCIUM 20 MG PO TABS: 20.0000 mg | ORAL_TABLET | Freq: Every day | ORAL | 3 refills | Status: DC

## 2020-10-08 MED ORDER — REPATHA SURECLICK 140 MG/ML ~~LOC~~ SOAJ: 140.0000 mg | SUBCUTANEOUS | 11 refills | Status: DC

## 2020-10-08 NOTE — Telephone Encounter (Signed)
Called and lmomed the pt that they should start repatha and switch from atorvastatin 80 to 20 via [facility]. Rx's sent to pharmacy and instructed the pt to call us back if they have any questions

## 2020-10-09 ENCOUNTER — Ambulatory Visit: Payer: Managed Care, Other (non HMO)

## 2020-10-09 ENCOUNTER — Telehealth: Payer: Self-pay

## 2020-10-09 NOTE — Telephone Encounter (Signed)
Pt presented in the office today for repatha demo and succesfly injected themself w/medication. Pt voiced understanding and had no further questions. Also offered them a copay card

## 2020-10-17 ENCOUNTER — Other Ambulatory Visit: Payer: Managed Care, Other (non HMO)

## 2020-10-23 ENCOUNTER — Other Ambulatory Visit: Payer: Self-pay

## 2020-10-23 ENCOUNTER — Ambulatory Visit
Admission: RE | Admit: 2020-10-23 | Discharge: 2020-10-23 | Disposition: A | Discharge status: Home / Self Care | Payer: Managed Care, Other (non HMO) | Source: Ambulatory Visit | Attending: Gynecology | Admitting: Gynecology

## 2020-10-24 ENCOUNTER — Ambulatory Visit: Payer: Managed Care, Other (non HMO) | Admitting: Endocrinology

## 2020-11-09 ENCOUNTER — Other Ambulatory Visit: Payer: Self-pay | Admitting: Endocrinology

## 2020-11-19 ENCOUNTER — Other Ambulatory Visit: Payer: Self-pay

## 2020-11-19 ENCOUNTER — Ambulatory Visit (INDEPENDENT_AMBULATORY_CARE_PROVIDER_SITE_OTHER): Payer: Managed Care, Other (non HMO) | Admitting: Family Medicine

## 2020-11-19 ENCOUNTER — Encounter (INDEPENDENT_AMBULATORY_CARE_PROVIDER_SITE_OTHER): Payer: Self-pay | Admitting: Family Medicine

## 2020-11-19 VITALS — BP 119/77 | HR 84 | Temp 97.9°F | Ht 60.0 in | Wt 172.0 lb

## 2020-11-19 DIAGNOSIS — Z9189 Other specified personal risk factors, not elsewhere classified: Secondary | ICD-10-CM

## 2020-11-19 DIAGNOSIS — E559 Vitamin D deficiency, unspecified: Secondary | ICD-10-CM | POA: Diagnosis not present

## 2020-11-19 DIAGNOSIS — E1169 Type 2 diabetes mellitus with other specified complication: Secondary | ICD-10-CM | POA: Diagnosis not present

## 2020-11-19 DIAGNOSIS — E1159 Type 2 diabetes mellitus with other circulatory complications: Secondary | ICD-10-CM

## 2020-11-19 DIAGNOSIS — Z6833 Body mass index (BMI) 33.0-33.9, adult: Secondary | ICD-10-CM

## 2020-11-19 DIAGNOSIS — F39 Unspecified mood [affective] disorder: Secondary | ICD-10-CM | POA: Diagnosis not present

## 2020-11-19 DIAGNOSIS — R5383 Other fatigue: Secondary | ICD-10-CM | POA: Diagnosis not present

## 2020-11-19 DIAGNOSIS — I152 Hypertension secondary to endocrine disorders: Secondary | ICD-10-CM

## 2020-11-19 DIAGNOSIS — E119 Type 2 diabetes mellitus without complications: Secondary | ICD-10-CM | POA: Insufficient documentation

## 2020-11-19 DIAGNOSIS — E669 Obesity, unspecified: Secondary | ICD-10-CM

## 2020-11-19 DIAGNOSIS — R0602 Shortness of breath: Secondary | ICD-10-CM | POA: Diagnosis not present

## 2020-11-19 DIAGNOSIS — E785 Hyperlipidemia, unspecified: Secondary | ICD-10-CM | POA: Insufficient documentation

## 2020-11-19 DIAGNOSIS — Z0289 Encounter for other administrative examinations: Secondary | ICD-10-CM

## 2020-11-20 LAB — COMPREHENSIVE METABOLIC PANEL
ALT: 13 IU/L (ref 0–32)
AST: 18 IU/L (ref 0–40)
Albumin/Globulin Ratio: 1.5 (ref 1.2–2.2)
Albumin: 4.5 g/dL (ref 3.8–4.9)
Alkaline Phosphatase: 77 IU/L (ref 44–121)
BUN/Creatinine Ratio: 19 (ref 9–23)
BUN: 12 mg/dL (ref 6–24)
Bilirubin Total: 0.6 mg/dL (ref 0.0–1.2)
CO2: 19 mmol/L — ABNORMAL LOW (ref 20–29)
Calcium: 10.8 mg/dL — ABNORMAL HIGH (ref 8.7–10.2)
Chloride: 101 mmol/L (ref 96–106)
Creatinine, Ser: 0.64 mg/dL (ref 0.57–1.00)
GFR calc Af Amer: 116 mL/min/{1.73_m2} (ref >59)
GFR calc non Af Amer: 101 mL/min/{1.73_m2} (ref >59)
Globulin, Total: 3.1 g/dL (ref 1.5–4.5)
Glucose: 99 mg/dL (ref 65–99)
Potassium: 4.4 mmol/L (ref 3.5–5.2)
Sodium: 136 mmol/L (ref 134–144)
Total Protein: 7.6 g/dL (ref 6.0–8.5)

## 2020-11-20 LAB — CBC WITH DIFFERENTIAL/PLATELET
Basophils Absolute: 0 10*3/uL (ref 0.0–0.2)
Basos: 0 %
EOS (ABSOLUTE): 0.3 10*3/uL (ref 0.0–0.4)
Eos: 3 %
Hematocrit: 39.3 % (ref 34.0–46.6)
Hemoglobin: 13.1 g/dL (ref 11.1–15.9)
Immature Grans (Abs): 0 10*3/uL (ref 0.0–0.1)
Immature Granulocytes: 0 %
Lymphocytes Absolute: 3.2 10*3/uL — ABNORMAL HIGH (ref 0.7–3.1)
Lymphs: 32 %
MCH: 26.2 pg — ABNORMAL LOW (ref 26.6–33.0)
MCHC: 33.3 g/dL (ref 31.5–35.7)
MCV: 79 fL (ref 79–97)
Monocytes Absolute: 0.6 10*3/uL (ref 0.1–0.9)
Monocytes: 7 %
Neutrophils Absolute: 5.7 10*3/uL (ref 1.4–7.0)
Neutrophils: 58 %
Platelets: 329 10*3/uL (ref 150–450)
RBC: 5 x10E6/uL (ref 3.77–5.28)
RDW: 13.7 % (ref 11.7–15.4)
WBC: 9.8 10*3/uL (ref 3.4–10.8)

## 2020-11-20 LAB — HEMOGLOBIN A1C
Est. average glucose Bld gHb Est-mCnc: 148 mg/dL
Hgb A1c MFr Bld: 6.8 % — ABNORMAL HIGH (ref 4.8–5.6)

## 2020-11-20 LAB — LIPID PANEL
Chol/HDL Ratio: 2.5 ratio (ref 0.0–4.4)
Cholesterol, Total: 95 mg/dL — ABNORMAL LOW (ref 100–199)
HDL: 38 mg/dL — ABNORMAL LOW (ref >39)
LDL Chol Calc (NIH): 35 mg/dL (ref 0–99)
Triglycerides: 120 mg/dL (ref 0–149)
VLDL Cholesterol Cal: 22 mg/dL (ref 5–40)

## 2020-11-20 LAB — T4, FREE: Free T4: 1.36 ng/dL (ref 0.82–1.77)

## 2020-11-20 LAB — INSULIN, RANDOM: INSULIN: 9.3 u[IU]/mL (ref 2.6–24.9)

## 2020-11-20 LAB — VITAMIN D 25 HYDROXY (VIT D DEFICIENCY, FRACTURES): Vit D, 25-Hydroxy: 48.5 ng/mL (ref 30.0–100.0)

## 2020-11-20 LAB — TSH: TSH: 3.02 u[IU]/mL (ref 0.450–4.500)

## 2020-11-20 LAB — FOLATE: Folate: 8.3 ng/mL (ref >3.0)

## 2020-11-20 LAB — T3: T3, Total: 128 ng/dL (ref 71–180)

## 2020-11-20 LAB — VITAMIN B12: Vitamin B-12: 519 pg/mL (ref 232–1245)

## 2020-11-20 NOTE — Progress Notes (Signed)
Chief Complaint:   OBESITY [name] [name] (MR# [id number]) is a 55 y.o. female who presents for evaluation and treatment of obesity and related comorbidities. Current BMI is Body mass index is 33.59 kg/m. [name] [name] has been struggling with her weight for many years and has been unsuccessful in either losing weight, maintaining weight loss, or reaching her healthy weight goal.  [name] [name] is currently in the action stage of change and ready to dedicate time achieving and maintaining a healthier weight. [name] [name] is interested in becoming our patient and working on intensive lifestyle modifications including (but not limited to) diet and exercise for weight loss.  [name] [name] lives with her spouse, [name] [name].  She is friends with my patient, [name] [name].  She craves sweets such as candy and cookies.  She also craves pizza, croissants, cheese, and chips.  She skips dinner.  [name] [name]'s habits were reviewed today and are as follows: Her family eats meals together, she thinks her family will eat healthier with her, her desired weight loss is 58 lbs, she has been heavy most of her life, she started gaining weight around age 84, her heaviest weight ever was 197 pounds, she craves sweets such as cakes, cookies, and candy and cheese, buttery breads (croissants), and pizza, she snacks frequently in the evenings, she skips dinner frequently, she is frequently drinking liquids with calories, she frequently makes poor food choices, she frequently eats larger portions than normal and she struggles with emotional eating.  This is the patient's first visit at [facility].  The patient's NEW PATIENT PACKET that they filled out prior to today's office visit was reviewed at length and information from that paperwork was included within the following office visit note.    Included in the packet: current and past health history, medications, allergies, ROS, gynecologic history (women only), surgical history, family history,  social history, weight history, weight loss surgery history (for those that have had weight loss surgery), nutritional evaluation, mood and food questionnaire along with a depression screening (PHQ9) on all patients, an Epworth questionnaire, sleep habits questionnaire, patient life and health improvement goals questionnaire. These will all be scanned into the patient's chart under media.   During the visit, I independently reviewed the patient's EKG, bioimpedance scale results, and indirect calorimeter results. I used this information to tailor a meal plan for the patient that will help [name] [name] to lose weight and will improve her obesity-related conditions going forward.  I performed a medically necessary appropriate examination and/or evaluation. I discussed the assessment and treatment plan with the patient. The patient was provided an opportunity to ask questions and all were answered. The patient agreed with the plan and demonstrated an understanding of the instructions. Labs were ordered today (unless patient declined them) and will be reviewed with the patient at our next visit unless more critical results need to be addressed immediately. Clinical information was updated and documented in the EMR.  Time spent on visit including pre-visit chart review and post-visit care was estimated to be 50 minutes.  A separate 15 minutes was spent on risk counseling (see above/below).   Depression Screen [name] [name]'s Food and Mood (modified PHQ-9) score was 13.  Depression screen PHQ 2/9 11/19/2020  Decreased Interest 1  Down, Depressed, Hopeless 1  PHQ - 2 Score 2  Altered sleeping 2  Tired, decreased energy 1  Change in appetite 2  Feeling bad or failure about yourself  1  Trouble concentrating 2  Moving slowly or fidgety/restless  2  Suicidal thoughts 1  PHQ-9 Score 13  Difficult doing work/chores Not difficult at all   Assessment/Plan:   Orders Placed This Encounter  Procedures  . Vitamin B12  . CBC  with Differential/Platelet  . Comprehensive metabolic panel  . Folate  . Hemoglobin A1c  . Insulin, random  . Lipid panel  . T3  . T4, free  . TSH  . VITAMIN D 25 Hydroxy (Vit-D Deficiency, Fractures)  . EKG 12-Lead    1. Other fatigue [name] [name] admits to daytime somnolence and reports waking up still tired. Patent has a history of symptoms of daytime fatigue, morning fatigue and snoring. [name] [name] generally gets 4 hours of sleep per night, and states that she has poor quality sleep. Snoring is present. Apneic episodes are present. Epworth Sleepiness Score is 10.  [name] [name] does feel that her weight is causing her energy to be lower than it should be. Fatigue may be related to obesity, depression or many other causes. Labs will be ordered, and in the meanwhile, [name] [name] will focus on self care including making healthy food choices, increasing physical activity and focusing on stress reduction.  Will check labs today, as per below.  - EKG 12-Lead - Vitamin B12 - CBC with Differential/Platelet - Comprehensive metabolic panel - Folate - Hemoglobin A1c - Insulin, random - Lipid panel - T3 - T4, free - TSH - VITAMIN D 25 Hydroxy (Vit-D Deficiency, Fractures)  2. SOBOE (shortness of breath on exertion) [name] [name] notes increasing shortness of breath with exercising and seems to be worsening over time with weight gain. She notes getting out of breath sooner with activity than she used to. This has gotten worse recently. [name] [name] denies shortness of breath at rest or orthopnea.  [name] [name] does feel that she gets out of breath more easily that she used to when she exercises. [name] [name]'s shortness of breath appears to be obesity related and exercise induced. She has agreed to work on weight loss and gradually increase exercise to treat her exercise induced shortness of breath. Will continue to monitor closely.  Will check labs today, as per below.  - Vitamin B12 - CBC with Differential/Platelet - Comprehensive metabolic  panel - Folate - Hemoglobin A1c - Insulin, random - Lipid panel - T3 - T4, free - TSH - VITAMIN D 25 Hydroxy (Vit-D Deficiency, Fractures)  3. Type 2 diabetes mellitus with other specified complication, without long-term current use of insulin (HCC) Diabetes Mellitus: Improving.  Medication: metformin XR 750 mg twice daily, Rybelsus 7 mg daily.  Issues reviewed: blood sugar goals, complications of diabetes mellitus, hypoglycemia prevention and treatment, exercise, and nutrition.  She is followed by Dr. Lucianne Muss in Endocrinology.  Plan: We will recheck labs in 3 months if not done with Endocrinology or PCP.  The importance of regular follow up with PCP and all other specialists as scheduled was stressed to patient today.  Lab Results  Component Value Date   HGBA1C 6.8 (H) 11/19/2020   HGBA1C 7.4 (H) 08/15/2020   HGBA1C 7.3 (H) 04/23/2020   Lab Results  Component Value Date   MICROALBUR 3.0 (H) 08/15/2020   LDLCALC 35 11/19/2020   CREATININE 0.64 11/19/2020   4. Hypertension associated with type 2 diabetes mellitus (HCC) At goal. Medications: Norvasc 10 mg daily, metoprolol 100 mg twice daily, valsartan 160 mg daily.  She is followed by Dr. Allyson Sabal in Cardiology.  Plan: Avoid buying foods that are: processed, frozen, or prepackaged to avoid excess salt. We will continue to monitor  symptoms as they relate to her weight loss journey.  BP Readings from Last 3 Encounters:  11/19/20 119/77  09/24/20 126/84  08/19/20 118/68   5. Hyperlipidemia associated with type 2 diabetes mellitus (HCC) Lipid-lowering medications: Lipitor 20 mg daily.  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  Followed by Cardiology.  Plan: Dietary changes: Increase soluble fiber. Decrease simple carbohydrates. Exercise changes: An average 40 minutes of moderate to vigorous-intensity aerobic activity 3 or 4 times per week.   6. Vitamin D deficiency Not at goal. Current vitamin D is 29.29, tested on  08/15/2020. Optimal goal > 50 ng/dL.   Plan:  [x]   Continue Vitamin D @50 ,000 IU every week. []   Continue home supplement daily. [x]   Will check vitamin D level and other labs today, as per below.  - Vitamin B12 - CBC with Differential/Platelet - Comprehensive metabolic panel - Folate - Hemoglobin A1c - Insulin, random - Lipid panel - T3 - T4, free - TSH - VITAMIN D 25 Hydroxy (Vit-D Deficiency, Fractures)  7. Mood disorder (HCC) with emotional eating With GAD and elevated PHQ of 13.  She is not on any medication for this.  Carnell is struggling with emotional eating and using food for comfort to the extent that it is negatively impacting her health. She has been working on behavior modification techniques to help reduce her emotional eating and has been unsuccessful. She shows no sign of suicidal or homicidal ideations.  Behavior modification techniques were discussed today to help Daleysa deal with her emotional/non-hunger eating behaviors.  Orders and follow up as documented in patient record.   8. At risk for heart disease Due to Rotha's current state of health and medical condition(s), she is at a higher risk for heart disease.  This puts the patient at much greater risk to subsequently develop cardiopulmonary conditions that can significantly affect patient's quality of life in a negative manner.    At least 24 minutes were spent on counseling [name] [name] about these concerns today, and I stressed the importance of reversing risks factors of obesity, especially truncal and visceral fat, hypertension, hyperlipidemia, and pre-diabetes.  The initial goal is to lose at least 5-10% of starting weight to help reduce these risk factors.  Counseling:  Intensive lifestyle modifications were discussed with [name] [name] as the most appropriate first line of treatment.  she will continue to work on diet, exercise, and weight loss efforts.  We will continue to reassess these conditions on a fairly regular basis in an  attempt to decrease the patient's overall morbidity and mortality.  Evidence-based interventions for health behavior change were utilized today including the discussion of self monitoring techniques, problem-solving barriers, and SMART goal setting techniques.  Specifically, regarding patient's less desirable eating habits and patterns, we employed the technique of small changes when Neda has not been able to fully commit to her prudent nutritional plan.  9. Class 1 obesity with serious comorbidity and body mass index (BMI) of 33.0 to 33.9 in adult, unspecified obesity type  [name] [name] is currently in the action stage of change and her goal is to continue with weight loss efforts. I recommend [name] [name] begin the structured treatment plan as follows:  She has agreed to the Category 1 Plan.  Exercise goals: As is.   Behavioral modification strategies: increasing lean protein intake, decreasing simple carbohydrates, no skipping meals, meal planning and cooking strategies and planning for success.  She was informed of the importance of frequent follow-up visits to  maximize her success with intensive lifestyle modifications for her multiple health conditions. She was informed we would discuss her lab results at her next visit unless there is a critical issue that needs to be addressed sooner. [name] [name] agreed to keep her next visit at the agreed upon time to discuss these results.  Objective:   Blood pressure 119/77, pulse 84, temperature 97.9 F (36.6 C), height 5' (1.524 m), weight 172 lb (78 kg), SpO2 97 %. Body mass index is 33.59 kg/m.  EKG: Normal sinus rhythm, rate 76 bpm.  Indirect Calorimeter completed today shows a VO2 of 205 and a REE of 1429.  Her calculated basal metabolic rate is 1610 thus her basal metabolic rate is better than expected.  General: Cooperative, alert, well developed, in no acute distress. HEENT: Conjunctivae and lids unremarkable. Cardiovascular: Regular rhythm.  Lungs: Normal  work of breathing. Neurologic: No focal deficits.   Lab Results  Component Value Date   HGBA1C 7.4 (H) 08/15/2020   HGBA1C 7.3 (H) 04/23/2020   HGBA1C 8.5 (A) 01/25/2020   HGBA1C 8.3 (A) 07/26/2019   Attestation Statements:   Reviewed by clinician on day of visit: allergies, medications, problem list, medical history, surgical history, family history, social history, and previous encounter notes.  I, Insurance claims handler, CMA, am acting as Energy manager for [employer], DO.  I have reviewed the above documentation for accuracy and completeness, and I agree with the above. Carlye Grippe, D.O.  The 21st Century Cures Act was signed into law in 2016 which includes the topic of electronic health records.  This provides immediate access to information in MyChart.  This includes consultation notes, operative notes, office notes, lab results and pathology reports.  If you have any questions about what you read please let us know at your next visit so we can discuss your concerns and take corrective action if need be.  We are right here with you.

## 2020-12-03 ENCOUNTER — Encounter (INDEPENDENT_AMBULATORY_CARE_PROVIDER_SITE_OTHER): Payer: Self-pay | Admitting: Family Medicine

## 2020-12-03 ENCOUNTER — Other Ambulatory Visit: Payer: Self-pay

## 2020-12-03 ENCOUNTER — Telehealth (INDEPENDENT_AMBULATORY_CARE_PROVIDER_SITE_OTHER): Payer: Managed Care, Other (non HMO) | Admitting: Family Medicine

## 2020-12-03 VITALS — Wt 169.0 lb

## 2020-12-03 DIAGNOSIS — E669 Obesity, unspecified: Secondary | ICD-10-CM | POA: Diagnosis not present

## 2020-12-03 DIAGNOSIS — I152 Hypertension secondary to endocrine disorders: Secondary | ICD-10-CM

## 2020-12-03 DIAGNOSIS — E1169 Type 2 diabetes mellitus with other specified complication: Secondary | ICD-10-CM | POA: Diagnosis not present

## 2020-12-03 DIAGNOSIS — Z6833 Body mass index (BMI) 33.0-33.9, adult: Secondary | ICD-10-CM

## 2020-12-03 DIAGNOSIS — E1159 Type 2 diabetes mellitus with other circulatory complications: Secondary | ICD-10-CM | POA: Diagnosis not present

## 2020-12-03 DIAGNOSIS — E559 Vitamin D deficiency, unspecified: Secondary | ICD-10-CM

## 2020-12-03 DIAGNOSIS — E785 Hyperlipidemia, unspecified: Secondary | ICD-10-CM

## 2020-12-04 ENCOUNTER — Telehealth: Payer: Self-pay | Admitting: Endocrinology

## 2020-12-04 ENCOUNTER — Other Ambulatory Visit: Payer: Self-pay | Admitting: *Deleted

## 2020-12-04 MED ORDER — RYBELSUS 7 MG PO TABS: ORAL_TABLET | ORAL | 1 refills | Status: DC

## 2020-12-04 MED ORDER — VITAMIN D (ERGOCALCIFEROL) 1.25 MG (50000 UNIT) PO CAPS: ORAL_CAPSULE | ORAL | 0 refills | Status: DC

## 2020-12-04 NOTE — Telephone Encounter (Signed)
REFILL REQUEST FOR   rybelsus (90 day)  PHARMACY  [facility] - [place], [place] - [address] Phone:  [phone]  Fax:  [phone]

## 2020-12-06 ENCOUNTER — Other Ambulatory Visit: Payer: Self-pay

## 2020-12-06 ENCOUNTER — Other Ambulatory Visit: Payer: Managed Care, Other (non HMO)

## 2020-12-06 DIAGNOSIS — E1165 Type 2 diabetes mellitus with hyperglycemia: Secondary | ICD-10-CM

## 2020-12-10 ENCOUNTER — Ambulatory Visit: Payer: Managed Care, Other (non HMO) | Admitting: Endocrinology

## 2020-12-10 ENCOUNTER — Encounter: Payer: Self-pay | Admitting: Endocrinology

## 2020-12-10 ENCOUNTER — Other Ambulatory Visit: Payer: Self-pay

## 2020-12-10 VITALS — BP 120/86 | HR 92 | Ht 60.0 in | Wt 170.0 lb

## 2020-12-10 DIAGNOSIS — E1169 Type 2 diabetes mellitus with other specified complication: Secondary | ICD-10-CM

## 2020-12-10 DIAGNOSIS — E669 Obesity, unspecified: Secondary | ICD-10-CM

## 2020-12-10 NOTE — Progress Notes (Signed)
TeleHealth Visit:  Due to the COVID-19 pandemic, this visit was completed with telemedicine (audio/video) technology to reduce patient and provider exposure as well as to preserve personal protective equipment.   [name] has verbally consented to this TeleHealth visit. The patient is located at home, the provider is located at the [facility] office. The participants in this visit include the listed provider and patient and. The visit was conducted today via MyChart.  OBESITY [name] [name] is here to discuss her progress with her obesity treatment plan along with follow-up of her obesity related diagnoses.   Today's visit was #: 2 Starting weight: 172 lbs Starting date: 11/19/2020 Today's date: 12/03/2020  Interim History:   [name] [name] is here today to review her NEW Meal Plan and to discuss all recent labs done here and/ or done at outside facilities. This is patient's first follow up visit. Extended time was spent counseling [name] [name] on all new disease processes that were discovered or that are worsening.   [name] [name] says she had parties over the past 2 weekends and did some off plan eating over both weekends.  Denies concerns with hunger or cravings when on plan.  Plan:  Increase water intake to over 40 ounces per day with a goal of 80 ounces per day.  Current Meal Plan: the Category 1 Plan for 70% of the time.  Current Exercise Plan: Fit app, treadmill, bike for 30-60 minutes 3 times per week.  Assessment/Plan:   1. Type 2 diabetes mellitus with other specified complication, without long-term current use of insulin (HCC) Discussed labs with patient today.  Diabetes Mellitus: Not at goal. Medication: Rybelsus 7 mg daily and metformin 750 mg twice daily.  Prior FBS running 140s.  Since starting the meal plan, they have been 120-125.  She is followed by Dr. Lucianne Muss in Endocrinology.    Plan:  Improving A1c.  Continue medications per Endocrinology.  Continue meal plan and  increase exercise with the goal of weight loss.  Lab Results  Component Value Date   HGBA1C 6.8 (H) 11/19/2020   HGBA1C 7.4 (H) 08/15/2020   HGBA1C 7.3 (H) 04/23/2020   Lab Results  Component Value Date   MICROALBUR 3.0 (H) 08/15/2020   LDLCALC 35 11/19/2020   CREATININE 0.64 11/19/2020   2. Hypertension associated with type 2 diabetes mellitus (HCC) Discussed labs with patient today.  Medications: Norvasc 10 mg daily, Lopressor 100 mg twice daily, Diovan 160 mg at bedtime.  Followed by Dr. Allyson Sabal of Cardiology.  Reviewed CMP with [name] [name] today.  It is stable.   Plan:  Continue medications per Cardiology.  Ambulatory blood pressure monitoring was encouraged with a goal of at least 2-3 times weekly or when feeling poorly, especially with weight loss.  She was instructed to keep a log for [name] to review at each office visit.  Decrease salt intake and increase water intake unless told otherwise by Cardiology.  BP Readings from Last 3 Encounters:  11/19/20 119/77  09/24/20 126/84  08/19/20 118/68   Lab Results  Component Value Date   CREATININE 0.64 11/19/2020   3. Hyperlipidemia associated with type 2 diabetes mellitus (HCC) Discussed labs with patient today.  Lipid-lowering medications: Lipitor 20 mg daily and Repatha 140 mg every 2 weeks.  Followed by Dr. Allyson Sabal of Cardiology.   Plan:  Lipid panel at goal, but HDL below 40.  Recommend increasing exercise to improve levels.  Medication management per Cardiology.    Lab  Results  Component Value Date   CHOL 95 (L) 11/19/2020   HDL 38 (L) 11/19/2020   LDLCALC 35 11/19/2020   TRIG 120 11/19/2020   CHOLHDL 2.5 11/19/2020   Lab Results  Component Value Date   ALT 13 11/19/2020   AST 18 11/19/2020   ALKPHOS 77 11/19/2020   BILITOT 0.6 11/19/2020   4. Vitamin D deficiency Discussed labs with patient today.  Current vitamin D is 48.5, tested on 11/19/2020. Optimal goal > 50 ng/dL.  She is taking vitamin D 50,000 IU weekly and OTC 2,000  IU daily.  Plan:  Continue current dose of prescription and OTC vitamin D.  Lab stable and at goal.  - Refill Vitamin D, Ergocalciferol, (DRISDOL) 1.25 MG (50000 UNIT) CAPS capsule; 1 po q 7 d  Dispense: 4 capsule; Refill: 0  5. Class 1 obesity with serious comorbidity and body mass index (BMI) of 33.0 to 33.9 in adult, unspecified obesity type  Course: [name] [name] is currently in the action stage of change. As such, her goal is to continue with weight loss efforts.   Nutrition goals: She has agreed to the Category 1 Plan.   Exercise goals: For substantial health benefits, adults should do at least 150 minutes (2 hours and 30 minutes) a week of moderate-intensity, or 75 minutes (1 hour and 15 minutes) a week of vigorous-intensity aerobic physical activity, or an equivalent combination of moderate- and vigorous-intensity aerobic activity. Aerobic activity should be performed in episodes of at least 10 minutes, and preferably, it should be spread throughout the week.  Behavioral modification strategies: increasing water intake, meal planning and cooking strategies, keeping healthy foods in the home and planning for success.  [name] [name] has agreed to follow-up with our clinic in 2-3 weeks. She was informed of the importance of frequent follow-up visits to maximize her success with intensive lifestyle modifications for her multiple health conditions.   Objective:   Weight 169 lb (76.7 kg). Body mass index is 33.01 kg/m.  General: Cooperative, alert, well developed, in no acute distress. HEENT: Conjunctivae and lids unremarkable. Cardiovascular: Regular rhythm.  Lungs: Normal work of breathing. Neurologic: No focal deficits.   Lab Results  Component Value Date   CREATININE 0.64 11/19/2020   BUN 12 11/19/2020   NA 136 11/19/2020   K 4.4 11/19/2020   CL 101 11/19/2020   CO2 19 (L) 11/19/2020   Lab Results  Component Value Date   ALT 13 11/19/2020   AST 18 11/19/2020   ALKPHOS 77 11/19/2020    BILITOT 0.6 11/19/2020   Lab Results  Component Value Date   HGBA1C 6.8 (H) 11/19/2020   HGBA1C 7.4 (H) 08/15/2020   HGBA1C 7.3 (H) 04/23/2020   HGBA1C 8.5 (A) 01/25/2020   HGBA1C 8.3 (A) 07/26/2019   Lab Results  Component Value Date   INSULIN 9.3 11/19/2020   Lab Results  Component Value Date   TSH 3.020 11/19/2020   Lab Results  Component Value Date   CHOL 95 (L) 11/19/2020   HDL 38 (L) 11/19/2020   LDLCALC 35 11/19/2020   TRIG 120 11/19/2020   CHOLHDL 2.5 11/19/2020   Lab Results  Component Value Date   WBC 9.8 11/19/2020   HGB 13.1 11/19/2020   HCT 39.3 11/19/2020   MCV 79 11/19/2020   PLT 329 11/19/2020   Attestation Statements:   Reviewed by clinician on day of visit: allergies, medications, problem list, medical history, surgical history, family history, social history, and previous encounter notes.  Time  spent on visit including pre-visit chart review and post-visit care and charting was 50 minutes.   I, Insurance claims handler, CMA, am acting as Energy manager for [employer], DO.  I have reviewed the above documentation for accuracy and completeness, and I agree with the above. Carlye Grippe, D.O.  The 21st Century Cures Act was signed into law in 2016 which includes the topic of electronic health records.  This provides immediate access to information in MyChart.  This includes consultation notes, operative notes, office notes, lab results and pathology reports.  If you have any questions about what you read please let us know at your next visit so we can discuss your concerns and take corrective action if need be.  We are right here with you.

## 2020-12-10 NOTE — Progress Notes (Signed)
Patient ID: [name] [name], female   DOB: 01/21/1966, 55 y.o.   MRN: [id number]              Chief complaint: Endocrinology follow-up  History of Present Illness:  Problem 1: DIABETES type II  She was initially diagnosed in 2017  Current management, blood sugar readings, problems identified:  Medication regimen: Metformin ER 750 mg twice daily, Rybelsus 7 mg in a.m.  A1c is 6.8 presents about her best Previously as high as 8.5,   Lab glucose was 99 and labs were done about 3 weeks ago  Difficult to analyze information from her [employer] meter  Has continued with her 2 drug regimen regularly  She is working with the nutritionist and weight loss program and is usually watching her diet well with reduced portions and healthier choices  She has lost another 5 pounds  Recently no nausea from 7 mg Rybelsus  She is trying to go to the gym about 3 days a week    Last consultation with dietitian: 7/21  Blood sugar data from the  faxed information   PRE-MEAL  before meals Lunch Dinner Bedtime Overall  Glucose range:  94-127     94-153  Mean/median:  118       POST-MEAL PC Breakfast PC Lunch  postprandial  Glucose range:    97-153  Mean/median:      Previously:   PRE-MEAL Fasting Lunch Dinner Bedtime Overall  Glucose range:  118  126, 133  112  123  112-188  Mean/median:      138   POST-MEAL PC Breakfast PC Lunch PC Dinner  Glucose range: 188  148-185  120  Mean/median:       Wt Readings from Last 3 Encounters:  12/10/20 170 lb (77.1 kg)  12/03/20 169 lb (76.7 kg)  11/19/20 172 lb (78 kg)    Lab Results  Component Value Date   HGBA1C 6.8 (H) 11/19/2020   HGBA1C 7.4 (H) 08/15/2020   HGBA1C 7.3 (H) 04/23/2020   Lab Results  Component Value Date   MICROALBUR 3.0 (H) 08/15/2020   LDLCALC 35 11/19/2020   CREATININE 0.64 11/19/2020    HYPERCALCEMIA:  Review of records show that she had a high calcium of 11.2 done in October 2017 The patient thinks  that she has had higher calcium before also but no records are available Apparently at some point she was taking HCTZ and this was stopped with some improvement in calcium  PTH level from unknown lab 29 done in 10/17 and subsequently 32 in 8/19  RECENT history: Her serum calcium has been generally high and ranging between 10.5 -11.4 since 12/18 This is now 10.8 compared to 11.4  Has been asymptomatic as before with no history of fractures or kidney stones No osteoporosis on previous bone density from 09/2017  Lab Results  Component Value Date   CALCIUM 10.8 (H) 11/19/2020   CALCIUM 11.4 (H) 08/15/2020   CALCIUM 11.0 (H) 04/23/2020   CALCIUM 10.7 (H) 01/18/2020   CALCIUM 11.2 (H) 07/24/2019   CALCIUM 10.5 03/03/2019   CALCIUM 11.1 (H) 11/22/2018   CALCIUM 11.3 (H) 07/22/2018   CALCIUM 10.9 (H) 05/23/2018   Lab Results  Component Value Date   PTH 32 05/23/2018   CALCIUM 10.8 (H) 11/19/2020   CAION 1.31 07/31/2017   PHOS 3.2 10/01/2017       VITAMIN D deficiency:  She has been on vitamin D supplements for persistently low levels below 20 since at  least 2018  She is taking taking vitamin D3, 5000 units  Also in addition to her OTC supplement she is taking prescription 50,000 units weekly This was started on her visit in 5/20 and her level at that time was about 20  Vitamin D level is recently back to normal  Lab Results  Component Value Date   VD25OH 48.5 11/19/2020   VD25OH 29.29 (L) 08/15/2020   VD25OH 35.56 01/18/2020   VD25OH 36.42 07/24/2019   VD25OH 19.97 (L) 03/03/2019        Allergies as of 12/10/2020      Reactions   Penicillins Hives   Has patient had a PCN reaction causing immediate rash, facial/tongue/throat swelling, SOB or lightheadedness with hypotension: unkn Has patient had a PCN reaction causing severe rash involving mucus membranes or skin necrosis: unkn Has patient had a PCN reaction that required hospitalization: no Has patient had a PCN  reaction occurring within the last 10 years: no If all of the above answers are "NO", then may proceed with Cephalosporin use.      Medication List       Accurate as of December 10, 2020  4:26 PM. If you have any questions, ask your nurse or doctor.        amLODipine 10 MG tablet Commonly known as: NORVASC TAKE 1 TABLET BY MOUTH EVERY DAY   aspirin 81 MG EC tablet Take 1 tablet (81 mg total) by mouth daily.   atorvastatin 20 MG tablet Commonly known as: LIPITOR Take 1 tablet (20 mg total) by mouth daily.   metFORMIN 750 MG 24 hr tablet Commonly known as: Glucophage XR Take 1 tablet (750 mg total) by mouth 2 (two) times daily after a meal.   metoprolol tartrate 100 MG tablet Commonly known as: LOPRESSOR TAKE 1 TABLET BY MOUTH TWICE A DAY   Repatha SureClick 140 MG/ML Soaj Generic drug: Evolocumab Inject 140 mg into the skin every 14 (fourteen) days.   Rybelsus 7 MG Tabs Generic drug: Semaglutide TAKE 1 TABLET DAILY BEFORE BREAKFAST. TAKE 30 MINUTES BEFORE BREAKFAST WITH WATER.   valsartan 160 MG tablet Commonly known as: DIOVAN TAKE 1 TABLET BY MOUTH EVERYDAY AT BEDTIME   Vitamin D (Ergocalciferol) 1.25 MG (50000 UNIT) Caps capsule Commonly known as: DRISDOL 1 po q 7 d   Vitamin D 50 MCG (2000 UT) tablet Take 2,000 Units by mouth every 14 (fourteen) days.       Allergies:  Allergies  Allergen Reactions  . Penicillins Hives    Has patient had a PCN reaction causing immediate rash, facial/tongue/throat swelling, SOB or lightheadedness with hypotension: unkn Has patient had a PCN reaction causing severe rash involving mucus membranes or skin necrosis: unkn Has patient had a PCN reaction that required hospitalization: no Has patient had a PCN reaction occurring within the last 10 years: no If all of the above answers are "NO", then may proceed with Cephalosporin use.     Past Medical History:  Diagnosis Date  . Anxiety   . Constipation   . Coronary  artery disease   . Diabetes mellitus without complication (HCC)   . Heart disease   . History of heart attack   . Hyperlipidemia   . Hypertension   . Obesity   . SOBOE (shortness of breath on exertion)   . Swelling of both lower extremities   . Trouble in sleeping   . Vitamin D deficiency     Past Surgical History:  Procedure Laterality Date  .  CARDIAC CATHETERIZATION    . CESAREAN SECTION  12/13/1998  . CORONARY ARTERY BYPASS GRAFT N/A 07/30/2017   Procedure: CORONARY ARTERY BYPASS GRAFTING (CABG)  x four, using right internal mammary artery, right greater saphenous vein, left radial artery;  Surgeon: Loreli Slot, MD;  Location: [facility] OR;  Service: Open Heart Surgery;  Laterality: N/A;  . INTRAOPERATIVE TRANSESOPHAGEAL ECHOCARDIOGRAM N/A 07/30/2017   Procedure: INTRAOPERATIVE TRANSESOPHAGEAL ECHOCARDIOGRAM;  Surgeon: Loreli Slot, MD;  Location: [facility] OR;  Service: Open Heart Surgery;  Laterality: N/A;  . LEFT HEART CATH AND CORONARY ANGIOGRAPHY N/A 07/29/2017   Procedure: LEFT HEART CATH AND CORONARY ANGIOGRAPHY;  Surgeon: [place], Peter M, MD;  Location: [facility] INVASIVE CV LAB;  Service: Cardiovascular;  Laterality: N/A;  . LEFT HEART CATH AND CORS/GRAFTS ANGIOGRAPHY N/A 07/25/2018   Procedure: LEFT HEART CATH AND CORS/GRAFTS ANGIOGRAPHY;  Surgeon: Runell Gess, MD;  Location: [facility] INVASIVE CV LAB;  Service: Cardiovascular;  Laterality: N/A;  . MYOMECTOMY  09/1994  . PARTIAL HYSTERECTOMY  2005  . RADIAL ARTERY HARVEST Left 07/30/2017   Procedure: RADIAL ARTERY HARVEST;  Surgeon: Loreli Slot, MD;  Location: [facility] OR;  Service: Open Heart Surgery;  Laterality: Left;    Family History  Problem Relation Age of Onset  . Hypertension Other   . Hypertension Mother   . Hyperlipidemia Mother   . Obesity Mother   . Diabetes Father   . Hypertension Father   . Hyperlipidemia Father     Social History:  reports that she has never smoked. She has never used smokeless  tobacco. She reports current alcohol use. She reports that she does not use drugs.  Review of Systems  HYPERCHOLESTEROLEMIA: Followed by PCP/cardiologist   She does have a history of CAD and now she is on Repatha also along with reduced dose of Lipitor 20 mg   Lab Results  Component Value Date   CHOL 95 (L) 11/19/2020   CHOL 161 02/21/2020   CHOL 156 11/23/2017   Lab Results  Component Value Date   HDL 38 (L) 11/19/2020   HDL 36 (L) 02/21/2020   HDL 36 (L) 11/23/2017   Lab Results  Component Value Date   LDLCALC 35 11/19/2020   LDLCALC 104 (H) 02/21/2020   LDLCALC 92 11/23/2017   Lab Results  Component Value Date   TRIG 120 11/19/2020   TRIG 113 02/21/2020   TRIG 140 11/23/2017   Lab Results  Component Value Date   CHOLHDL 2.5 11/19/2020   CHOLHDL 4.5 (H) 02/21/2020   CHOLHDL 4.3 11/23/2017   No results found for: LDLDIRECT    EXAM:  BP 120/86   Pulse 92   Ht 5' (1.524 m)   Wt 170 lb (77.1 kg)   SpO2 97%   BMI 33.20 kg/m    Assessment/Plan:   DIABETES type 2, non-insulin-dependent:  Her A1c is 6.8 compared to 7.4  She is continuing on a regimen of Rybelsus 7 mg in the morning and Metformin ER  Her blood sugars are much better and with lifestyle changes she is doing very well Has lost more weight Also exercising regularly Blood sugars are very steady at home and very consistently in the low 100 range recently She will continue the same regimen  Hypercalcemia is variable and now 10.8 compared to 11.4, will need to monitor periodically   HYPERCHOLESTEROLEMIA: Now followed by cardiologist and is on statin with Repatha with adequate control    Reather Littler 12/10/2020, 4:26 PM  Note: This office note was prepared with Dragon voice recognition system technology. Any transcriptional errors that result from this process are unintentional.  

## 2020-12-25 ENCOUNTER — Ambulatory Visit (INDEPENDENT_AMBULATORY_CARE_PROVIDER_SITE_OTHER): Payer: Managed Care, Other (non HMO) | Admitting: Family Medicine

## 2020-12-25 ENCOUNTER — Other Ambulatory Visit: Payer: Self-pay

## 2020-12-25 ENCOUNTER — Encounter (INDEPENDENT_AMBULATORY_CARE_PROVIDER_SITE_OTHER): Payer: Self-pay | Admitting: Family Medicine

## 2020-12-25 VITALS — BP 119/73 | HR 82 | Temp 98.2°F | Ht 60.0 in | Wt 165.0 lb

## 2020-12-25 DIAGNOSIS — E669 Obesity, unspecified: Secondary | ICD-10-CM | POA: Diagnosis not present

## 2020-12-25 DIAGNOSIS — E559 Vitamin D deficiency, unspecified: Secondary | ICD-10-CM

## 2020-12-25 DIAGNOSIS — Z6832 Body mass index (BMI) 32.0-32.9, adult: Secondary | ICD-10-CM

## 2020-12-25 DIAGNOSIS — Z9189 Other specified personal risk factors, not elsewhere classified: Secondary | ICD-10-CM

## 2020-12-25 MED ORDER — VITAMIN D (ERGOCALCIFEROL) 1.25 MG (50000 UNIT) PO CAPS
ORAL_CAPSULE | ORAL | 0 refills | Status: DC
Start: 2020-12-25 — End: 2021-01-14

## 2021-01-02 NOTE — Progress Notes (Signed)
Chief Complaint:   OBESITY [name] [name] is here to discuss her progress with her obesity treatment plan along with follow-up of her obesity related diagnoses.   Today's visit was #: 3 Starting weight: 172 lbs Starting date: 11/19/2020 Today's weight: 165 lbs Today's date: 12/25/2020 Total lbs lost to date: 7 lbs Body mass index is 32.22 kg/m.  Total weight loss percentage to date: -4.07%  Interim History:  [name] [name] is here for a follow up office visit and she is following the meal plan without concerns or issues.  Patient's meal and food recall appears to be accurate and consistent with what is on the plan.  When on plan, her hunger and cravings are well controlled.   [name] says the plan is going well during the week, but on the weekends, social settings make it difficult with food and wine choices.  She says she is bored with breakfast.  Current Meal Plan: the Category 1 Plan for 90% of the time.  Current Exercise Plan: Walking/stationary bike for 40 minutes 2 times per week. Current Anti-Obesity Medications: Rybelsus 7 mg daily. Side effects: None.  Assessment/Plan:   1. Vitamin D deficiency Improving, but not optimized. Current vitamin D is 48.5, tested on 11/19/2020. Optimal goal > 50 ng/dL.  She is taking vitamin D 50,000 IU weekly.  Plan: Continue to take prescription Vitamin D @50 ,000 IU every week as prescribed.  Follow-up for routine testing of Vitamin D, at least 2-3 times per year to avoid over-replacement.  - Refill Vitamin D, Ergocalciferol, (DRISDOL) 1.25 MG (50000 UNIT) CAPS capsule; 1 po q 7 d  Dispense: 4 capsule; Refill: 0  2. At risk for impaired metabolic function Due to Keoni's current state of health and medical condition(s), she is at a significantly higher risk for impaired metabolic function.   At least 9 minutes was spent on counseling Adamaris about these concerns today.  This places the patient at a much greater risk to subsequently develop cardio-pulmonary  conditions that can negatively affect the patient's quality of life.  I stressed the importance of reversing these risks factors.  The initial goal is to lose at least 5-10% of starting weight to help reduce risk factors.  Counseling:  Intensive lifestyle modifications discussed with [name] [name] as the most appropriate first line treatment.  she will continue to work on diet, exercise, and weight loss efforts.  We will continue to reassess these conditions on a fairly regular basis in an attempt to decrease the patient's overall morbidity and mortality.  3. Class 1 obesity with serious comorbidity and body mass index (BMI) of 32.0 to 32.9 in adult, unspecified obesity type  Course: [name] [name] is currently in the action stage of change. As such, her goal is to continue with weight loss efforts.   Nutrition goals: She has agreed to the Category 1 Plan with breakfast options.   Exercise goals: As is.  Behavioral modification strategies: meal planning and cooking strategies, better snacking choices and planning for success.  [name] [name] has agreed to follow-up with our clinic in 2-2.5 weeks. She was informed of the importance of frequent follow-up visits to maximize her success with intensive lifestyle modifications for her multiple health conditions.   Objective:   Blood pressure 119/73, pulse 82, temperature 98.2 F (36.8 C), height 5' (1.524 m), weight 165 lb (74.8 kg), SpO2 93 %. Body mass index is 32.22 kg/m.  General: Cooperative, alert, well developed, in no acute distress. HEENT: Conjunctivae and lids unremarkable. Cardiovascular: Regular  rhythm.  Lungs: Normal work of breathing. Neurologic: No focal deficits.   Lab Results  Component Value Date   CREATININE 0.64 11/19/2020   BUN 12 11/19/2020   NA 136 11/19/2020   K 4.4 11/19/2020   CL 101 11/19/2020   CO2 19 (L) 11/19/2020   Lab Results  Component Value Date   ALT 13 11/19/2020   AST 18 11/19/2020   ALKPHOS 77 11/19/2020   BILITOT 0.6  11/19/2020   Lab Results  Component Value Date   HGBA1C 6.8 (H) 11/19/2020   HGBA1C 7.4 (H) 08/15/2020   HGBA1C 7.3 (H) 04/23/2020   HGBA1C 8.5 (A) 01/25/2020   HGBA1C 8.3 (A) 07/26/2019   Lab Results  Component Value Date   INSULIN 9.3 11/19/2020   Lab Results  Component Value Date   TSH 3.020 11/19/2020   Lab Results  Component Value Date   CHOL 95 (L) 11/19/2020   HDL 38 (L) 11/19/2020   LDLCALC 35 11/19/2020   TRIG 120 11/19/2020   CHOLHDL 2.5 11/19/2020   Lab Results  Component Value Date   WBC 9.8 11/19/2020   HGB 13.1 11/19/2020   HCT 39.3 11/19/2020   MCV 79 11/19/2020   PLT 329 11/19/2020   Attestation Statements:   Reviewed by clinician on day of visit: allergies, medications, problem list, medical history, surgical history, family history, social history, and previous encounter notes.  I, Insurance claims handler, CMA, am acting as Energy manager for [employer], DO.  I have reviewed the above documentation for accuracy and completeness, and I agree with the above. Carlye Grippe, D.O.  The 21st Century Cures Act was signed into law in 2016 which includes the topic of electronic health records.  This provides immediate access to information in MyChart.  This includes consultation notes, operative notes, office notes, lab results and pathology reports.  If you have any questions about what you read please let us know at your next visit so we can discuss your concerns and take corrective action if need be.  We are right here with you.

## 2021-01-14 ENCOUNTER — Other Ambulatory Visit: Payer: Self-pay

## 2021-01-14 ENCOUNTER — Encounter (INDEPENDENT_AMBULATORY_CARE_PROVIDER_SITE_OTHER): Payer: Self-pay | Admitting: Family Medicine

## 2021-01-14 ENCOUNTER — Ambulatory Visit (INDEPENDENT_AMBULATORY_CARE_PROVIDER_SITE_OTHER): Payer: Managed Care, Other (non HMO) | Admitting: Family Medicine

## 2021-01-14 VITALS — BP 110/72 | HR 75 | Temp 97.4°F | Ht 60.0 in | Wt 165.0 lb

## 2021-01-14 DIAGNOSIS — Z6833 Body mass index (BMI) 33.0-33.9, adult: Secondary | ICD-10-CM

## 2021-01-14 DIAGNOSIS — Z9189 Other specified personal risk factors, not elsewhere classified: Secondary | ICD-10-CM

## 2021-01-14 DIAGNOSIS — E559 Vitamin D deficiency, unspecified: Secondary | ICD-10-CM | POA: Diagnosis not present

## 2021-01-14 DIAGNOSIS — E669 Obesity, unspecified: Secondary | ICD-10-CM | POA: Diagnosis not present

## 2021-01-14 MED ORDER — VITAMIN D (ERGOCALCIFEROL) 1.25 MG (50000 UNIT) PO CAPS: ORAL_CAPSULE | ORAL | 0 refills | Status: DC

## 2021-01-17 ENCOUNTER — Encounter: Payer: Managed Care, Other (non HMO) | Attending: Endocrinology | Admitting: Dietician

## 2021-01-22 NOTE — Progress Notes (Signed)
Chief Complaint:   OBESITY [name] [name] is here to discuss her progress with her obesity treatment plan along with follow-up of her obesity related diagnoses.   Today's visit was #: 4 Starting weight: 172 lbs Starting date: 11/19/2020 Today's weight: 165 lbs Today's date: 01/14/2021 Total lbs lost to date: 7 lbs Body mass index is 32.22 kg/m.  Total weight loss percentage to date: -4.07%  Interim History:  [name] [name] has not gained or lost since her last visit.  She says she has been socializing on the weekends, which has kept her doing off plan eating.  She was in [place] for 4 days for a long weekend and says it was difficult to stay on plan.  No issues with the plan or with hunger/cravings.  Current Meal Plan: the Category 1 Plan with breakfast options.  Current Exercise Plan: Cardio/going to the gym for 30-40 minutes 3 times per week. Current Anti-Obesity Medications: Rybelsus 7 mg daily. Side effects: None.  Assessment/Plan:   Medications Discontinued During This Encounter  Medication Reason  . Vitamin D, Ergocalciferol, (DRISDOL) 1.25 MG (50000 UNIT) CAPS capsule Reorder     Meds ordered this encounter  Medications  . Vitamin D, Ergocalciferol, (DRISDOL) 1.25 MG (50000 UNIT) CAPS capsule    Sig: 1 po q 7 d    Dispense:  4 capsule    Refill:  0    (OV for RF)     1. Vitamin D deficiency Improving, but not optimized. Current vitamin D is 48.5, tested on 11/19/2020. Optimal goal > 50 ng/dL.  She is taking vitamin D 50,000 IU weekly.  Plan: Continue to take prescription Vitamin D @50 ,000 IU every week as prescribed.  Follow-up for routine testing of Vitamin D, at least 2-3 times per year to avoid over-replacement.  - Refill Vitamin D, Ergocalciferol, (DRISDOL) 1.25 MG (50000 UNIT) CAPS capsule; 1 po q 7 d  Dispense: 4 capsule; Refill: 0  2. At risk for impaired metabolic function Due to [name] [name]'s current state of health and medical condition(s), she is at a significantly higher risk  for impaired metabolic function.   At least 9 minutes was spent on counseling [name] [name] about these concerns today.  This places the patient at a much greater risk to subsequently develop cardio-pulmonary conditions that can negatively affect the patient's quality of life.  I stressed the importance of reversing these risks factors.  The initial goal is to lose at least 5-10% of starting weight to help reduce risk factors.  Counseling:  Intensive lifestyle modifications discussed with [name] [name] as the most appropriate first line treatment.  she will continue to work on diet, exercise, and weight loss efforts.  We will continue to reassess these conditions on a fairly regular basis in an attempt to decrease the patient's overall morbidity and mortality.  3. Obesity, current BMI 32.3  Course: [name] [name] is currently in the action stage of change. As such, her goal is to continue with weight loss efforts.   Nutrition goals: She has agreed to the Category 1 Plan.   Exercise goals: As is.  Behavioral modification strategies: meal planning and cooking strategies, travel eating strategies and planning for success.  [name] [name] has agreed to follow-up with our clinic in 2-3 weeks. She was informed of the importance of frequent follow-up visits to maximize her success with intensive lifestyle modifications for her multiple health conditions.    Objective:   Blood pressure 110/72, pulse 75, temperature (!) 97.4 F (36.3 C), height 5' (1.524 m),  weight 165 lb (74.8 kg), SpO2 96 %. Body mass index is 32.22 kg/m.  General: Cooperative, alert, well developed, in no acute distress. HEENT: Conjunctivae and lids unremarkable. Cardiovascular: Regular rhythm.  Lungs: Normal work of breathing. Neurologic: No focal deficits.   Lab Results  Component Value Date   CREATININE 0.64 11/19/2020   BUN 12 11/19/2020   NA 136 11/19/2020   K 4.4 11/19/2020   CL 101 11/19/2020   CO2 19 (L) 11/19/2020   Lab Results  Component Value  Date   ALT 13 11/19/2020   AST 18 11/19/2020   ALKPHOS 77 11/19/2020   BILITOT 0.6 11/19/2020   Lab Results  Component Value Date   HGBA1C 6.8 (H) 11/19/2020   HGBA1C 7.4 (H) 08/15/2020   HGBA1C 7.3 (H) 04/23/2020   HGBA1C 8.5 (A) 01/25/2020   HGBA1C 8.3 (A) 07/26/2019   Lab Results  Component Value Date   INSULIN 9.3 11/19/2020   Lab Results  Component Value Date   TSH 3.020 11/19/2020   Lab Results  Component Value Date   CHOL 95 (L) 11/19/2020   HDL 38 (L) 11/19/2020   LDLCALC 35 11/19/2020   TRIG 120 11/19/2020   CHOLHDL 2.5 11/19/2020   Lab Results  Component Value Date   WBC 9.8 11/19/2020   HGB 13.1 11/19/2020   HCT 39.3 11/19/2020   MCV 79 11/19/2020   PLT 329 11/19/2020   Attestation Statements:   Reviewed by clinician on day of visit: allergies, medications, problem list, medical history, surgical history, family history, social history, and previous encounter notes.  I, Insurance claims handler, CMA, am acting as Energy manager for [employer], DO.  I have reviewed the above documentation for accuracy and completeness, and I agree with the above. Carlye Grippe, D.O.  The 21st Century Cures Act was signed into law in 2016 which includes the topic of electronic health records.  This provides immediate access to information in MyChart.  This includes consultation notes, operative notes, office notes, lab results and pathology reports.  If you have any questions about what you read please let us know at your next visit so we can discuss your concerns and take corrective action if need be.  We are right here with you.

## 2021-01-28 ENCOUNTER — Encounter (INDEPENDENT_AMBULATORY_CARE_PROVIDER_SITE_OTHER): Payer: Self-pay

## 2021-01-30 ENCOUNTER — Ambulatory Visit (INDEPENDENT_AMBULATORY_CARE_PROVIDER_SITE_OTHER): Payer: Managed Care, Other (non HMO) | Admitting: Family Medicine

## 2021-02-05 ENCOUNTER — Other Ambulatory Visit: Payer: Self-pay

## 2021-02-05 ENCOUNTER — Ambulatory Visit: Payer: Managed Care, Other (non HMO) | Admitting: Cardiovascular Disease

## 2021-02-05 ENCOUNTER — Encounter: Payer: Self-pay | Admitting: Cardiovascular Disease

## 2021-02-05 DIAGNOSIS — E785 Hyperlipidemia, unspecified: Secondary | ICD-10-CM | POA: Diagnosis not present

## 2021-02-05 DIAGNOSIS — I10 Essential (primary) hypertension: Secondary | ICD-10-CM | POA: Diagnosis not present

## 2021-02-05 DIAGNOSIS — Z951 Presence of aortocoronary bypass graft: Secondary | ICD-10-CM

## 2021-02-05 MED ORDER — AMLODIPINE BESYLATE 10 MG PO TABS: 1.0000 | ORAL_TABLET | Freq: Every day | ORAL | 3 refills | Status: DC

## 2021-02-05 MED ORDER — VALSARTAN 160 MG PO TABS: ORAL_TABLET | ORAL | 3 refills | Status: DC

## 2021-02-05 MED ORDER — METOPROLOL TARTRATE 100 MG PO TABS: 100.0000 mg | ORAL_TABLET | Freq: Every day | ORAL | 3 refills | Status: DC

## 2021-02-05 MED ORDER — ATORVASTATIN CALCIUM 20 MG PO TABS: 20.0000 mg | ORAL_TABLET | Freq: Every day | ORAL | 3 refills | Status: DC

## 2021-02-05 MED ORDER — REPATHA SURECLICK 140 MG/ML ~~LOC~~ SOAJ: 140.0000 mg | SUBCUTANEOUS | 11 refills | Status: DC

## 2021-02-05 NOTE — Assessment & Plan Note (Signed)
History of essential hypertension blood pressure measured today 122/68.  She is on amlodipine, metoprolol and valsartan.

## 2021-02-05 NOTE — Assessment & Plan Note (Signed)
History of CAD status post non-STEMI 07/29/2017 with cardiac catheterization performed by Dr. [place] revealing three-vessel disease.  She underwent CABG by Dr. Dorris Fetch the following day without RIMA to the PDA, vein to the diagonal branch and sequential radial to OM1 and LM to Lise Pincus shape she was discharged home a week later.  She briefly participate in cardiac rehab.  Because of left arm pain she underwent Myoview stress testing 07/20/2018 revealing transient ischemic dilatation which could have been a "anginal equivalent".  Because of this I performed repeat catheterization on her as an outpatient 07/25/2018 revealing widely patent grafts.  She has been asymptomatic since.

## 2021-02-05 NOTE — Progress Notes (Signed)
02/05/2021 [name] [name]   Apr 01, 1966  [id number]  Primary Physician [name] Carol, MD Primary Cardiologist: [name] Harp MD [name] [name], [place]  HPI:  [name] [name] is a 55 y.o.  moderately overweight married African-American female mother of one 24 year old daughter whoI last saw in the office5/02/2020..I met during her hospitalization on 07/29/17 when she was admitted with a non-STEMI. She required recatheterization that day by Dr. Martin[place] revealing three-vessel disease with normal LV function and bypass grafting the following day by Dr. Roxan [name] is a RIMA to the PDA, vein to the second diagonal branch and sequential radial to OM 1 and 2. Her postoperative course is uncooperative. She was discharged on 08/06/17.To briefly participate in cardiac rehab which was prematurely terminated because of hypertension issues.  Because of left arm pain she underwent Myoview stress testing 07/20/2018 which revealed transient ischemic dilatation which could have been an anginal equivalent. Based on this I performed cardiac catheterization on her as an outpatient 07/25/2018 revealing widely patent grafts. Her most recent lipid profile on statin therapy performed 02/22/2019 revealed total cholesterol 161, LDL of 96 and HDL 36.  Since I saw her a year ago she continues to do well.  She is seeing Dr. Raliegh [name] for weight loss.  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  She works out 3 days a week doing treadmill and light weights.  She denies chest pain or shortness of breath.   Current Meds  Medication Sig  . aspirin 81 MG EC tablet Take 1 tablet (81 mg total) by mouth daily.  . metFORMIN (GLUCOPHAGE-XR) 500 MG 24 hr tablet Take 1,000 mg by mouth 2 (two) times daily.  . Semaglutide (RYBELSUS) 7 MG TABS TAKE 1 TABLET DAILY BEFORE BREAKFAST. TAKE 30 MINUTES BEFORE BREAKFAST WITH WATER.  . Vitamin D, Ergocalciferol, (DRISDOL) 1.25 MG (50000 UNIT) CAPS  capsule 1 po q 7 d  . [DISCONTINUED] amLODipine (NORVASC) 10 MG tablet TAKE 1 TABLET BY MOUTH EVERY DAY  . [DISCONTINUED] atorvastatin (LIPITOR) 20 MG tablet Take 1 tablet (20 mg total) by mouth daily.  . [DISCONTINUED] Evolocumab (REPATHA SURECLICK) 867 MG/ML SOAJ Inject 140 mg into the skin every 14 (fourteen) days.  . [DISCONTINUED] metoprolol tartrate (LOPRESSOR) 100 MG tablet TAKE 1 TABLET BY MOUTH TWICE A DAY (Patient taking differently: Take 100 mg by mouth daily.)  . [DISCONTINUED] valsartan (DIOVAN) 160 MG tablet TAKE 1 TABLET BY MOUTH EVERYDAY AT BEDTIME     Allergies  Allergen Reactions  . Penicillins Hives    Has patient had a PCN reaction causing immediate rash, facial/tongue/throat swelling, SOB or lightheadedness with hypotension: unkn Has patient had a PCN reaction causing severe rash involving mucus membranes or skin necrosis: unkn Has patient had a PCN reaction that required hospitalization: no Has patient had a PCN reaction occurring within the last 10 years: no If all of the above answers are "NO", then may proceed with Cephalosporin use.     Social History   Socioeconomic History  . Marital status: Married    Spouse name: [name] [name]  . Number of children: 1  . Years of education: Not on file  . Highest education level: Not on file  Occupational History  . Occupation: Manufacturing engineer  Tobacco Use  . Smoking status: Never Smoker  . Smokeless tobacco: Never Used  Vaping Use  . Vaping Use: Never used  Substance and Sexual Activity  . Alcohol use: Yes  .  Drug use: No  . Sexual activity: Not on file  Other Topics Concern  . Not on file  Social History Narrative  . Not on file   Social Determinants of Health   Financial Resource Strain: Not on file  Food Insecurity: Not on file  Transportation Needs: Not on file  Physical Activity: Not on file  Stress: Not on file  Social Connections: Not on file  Intimate Partner Violence: Not on file      Review of Systems: General: negative for chills, fever, night sweats or weight changes.  Cardiovascular: negative for chest pain, dyspnea on exertion, edema, orthopnea, palpitations, paroxysmal nocturnal dyspnea or shortness of breath Dermatological: negative for rash Respiratory: negative for cough or wheezing Urologic: negative for hematuria Abdominal: negative for nausea, vomiting, diarrhea, bright red blood per rectum, melena, or hematemesis Neurologic: negative for visual changes, syncope, or dizziness All other systems reviewed and are otherwise negative except as noted above.    Blood pressure 122/68, pulse 78, height 5' (1.524 m), weight 170 lb 9.6 oz (77.4 kg), SpO2 94 %.  General appearance: alert and no distress Neck: no adenopathy, no carotid bruit, no JVD, supple, symmetrical, trachea midline and thyroid not enlarged, symmetric, no tenderness/mass/nodules Lungs: clear to auscultation bilaterally Heart: regular rate and rhythm, S1, S2 normal, no murmur, click, rub or gallop Extremities: extremities normal, atraumatic, no cyanosis or edema Pulses: 2+ and symmetric Skin: Skin color, texture, turgor normal. No rashes or lesions Neurologic: Alert and oriented X 3, normal strength and tone. Normal symmetric reflexes. Normal coordination and gait  EKG not performed today  ASSESSMENT AND PLAN:   Dyslipidemia History of dyslipidemia intolerant to statin therapy now on Repatha with lipid profile performed 11/19/2020 revealing total cholesterol 95, LDL 35 and HDL of 38.  Essential hypertension History of essential hypertension blood pressure measured today 122/68.  She is on amlodipine, metoprolol and valsartan.  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 Martinique revealing three-vessel disease.  She underwent CABG by Dr. Roxan [name] the following day without RIMA to the PDA, vein to the diagonal branch and sequential  radial to OM1 and LM to [name] [name] shape she was discharged home a week later.  She briefly participate in cardiac rehab.  Because of left arm pain she underwent Myoview stress testing 07/20/2018 revealing transient ischemic dilatation which could have been a "anginal equivalent".  Because of this I performed repeat catheterization on her as an outpatient 07/25/2018 revealing widely patent grafts.  She has been asymptomatic since.      [name] Harp MD FACP,FACC,FAHA, [facility] 02/05/2021 10:53 AM

## 2021-02-05 NOTE — Patient Instructions (Signed)

## 2021-02-05 NOTE — Assessment & Plan Note (Signed)
History of dyslipidemia intolerant to statin therapy now on Repatha with lipid profile performed 11/19/2020 revealing total cholesterol 95, LDL 35 and HDL of 38.

## 2021-02-13 ENCOUNTER — Ambulatory Visit (INDEPENDENT_AMBULATORY_CARE_PROVIDER_SITE_OTHER): Payer: Managed Care, Other (non HMO) | Admitting: Family Medicine

## 2021-03-12 ENCOUNTER — Ambulatory Visit (INDEPENDENT_AMBULATORY_CARE_PROVIDER_SITE_OTHER): Payer: Managed Care, Other (non HMO) | Admitting: Family Medicine

## 2021-03-12 ENCOUNTER — Encounter (INDEPENDENT_AMBULATORY_CARE_PROVIDER_SITE_OTHER): Payer: Self-pay | Admitting: Family Medicine

## 2021-03-12 ENCOUNTER — Other Ambulatory Visit: Payer: Self-pay

## 2021-03-12 VITALS — BP 103/67 | HR 80 | Temp 97.7°F | Ht 60.0 in | Wt 167.0 lb

## 2021-03-12 DIAGNOSIS — Z6833 Body mass index (BMI) 33.0-33.9, adult: Secondary | ICD-10-CM

## 2021-03-12 DIAGNOSIS — Z9189 Other specified personal risk factors, not elsewhere classified: Secondary | ICD-10-CM | POA: Insufficient documentation

## 2021-03-12 DIAGNOSIS — E559 Vitamin D deficiency, unspecified: Secondary | ICD-10-CM

## 2021-03-12 DIAGNOSIS — E669 Obesity, unspecified: Secondary | ICD-10-CM | POA: Diagnosis not present

## 2021-03-12 MED ORDER — VITAMIN D (ERGOCALCIFEROL) 1.25 MG (50000 UNIT) PO CAPS
ORAL_CAPSULE | ORAL | 0 refills | Status: DC
Start: 2021-03-12 — End: 2021-04-02

## 2021-03-25 NOTE — Progress Notes (Signed)
Chief Complaint:   OBESITY [name] [name] is here to discuss her progress with her obesity treatment plan along with follow-up of her obesity related diagnoses.   Today's visit was #: 5 Starting weight: 172 lbs Starting date: 11/19/2020 Today's weight: 167 lbs Today's date: 04/12/2021 Weight change since last visit: +2 lbs Total lbs lost to date: 5 lbs Body mass index is 32.61 kg/m.  Total weight loss percentage to date: -2.91%  Interim History:  [name] [name]'s last office visit was 01/14/2021.  She says she is ready to make a change, but had a very stressful April and was unable to come in.  Current Meal Plan: the Category 1 Plan for 60% of the time.  Current Exercise Plan: Weights, cardio for 30-40 minutes 2 times per week. Current Anti-Obesity Medications: Rybelsus 7 mg daily. Side effects: None.  Assessment/Plan:   Medications Discontinued During This Encounter  Medication Reason   Vitamin D, Ergocalciferol, (DRISDOL) 1.25 MG (50000 UNIT) CAPS capsule Reorder   Meds ordered this encounter  Medications   Vitamin D, Ergocalciferol, (DRISDOL) 1.25 MG (50000 UNIT) CAPS capsule    Sig: 1 po q 7 d    Dispense:  4 capsule    Refill:  0    (OV for RF)   1. Vitamin D deficiency Not optimized. Current vitamin D is 48.5, tested on 11/19/2020. Optimal goal > 50 ng/dL.  She is taking vitamin D 50,000 IU weekly.  Plan: Continue to take prescription Vitamin D @50 ,000 IU every week as prescribed.  Follow-up for routine testing of Vitamin D, at least 2-3 times per year to avoid over-replacement.  - Refill Vitamin D, Ergocalciferol, (DRISDOL) 1.25 MG (50000 UNIT) CAPS capsule; 1 po q 7 d  Dispense: 4 capsule; Refill: 0  2. At risk for malnutrition [name] [name] was given extensive malnutrition prevention education and counseling today of more than 8 minutes.  Counseled her that malnutrition refers to inappropriate nutrients or not the right balance of nutrients for optimal health.  Discussed with [name] [name] that it is absolutely possible to be malnourished but yet obese.  Risk factors, including but not limited to, inappropriate dietary choices, difficulty with obtaining food due to physical or financial limitations, and various physical and mental health conditions were reviewed with [name] [name].   3. Obesity, current BMI 32.7  Course: [name] [name] is currently in the action stage of change. As such, her goal is to continue with weight loss efforts.   Nutrition goals: She has agreed to keeping a food journal and adhering to recommended goals of 1000-1100 calories and 80 grams of protein.   Exercise goals:  As is.  Behavioral modification strategies: increasing lean protein intake, keeping healthy foods in the home and planning for success.  [name] [name] has agreed to follow-up with our clinic in 3-4 weeks. She was informed of the importance of frequent follow-up visits to maximize her success with intensive lifestyle modifications for her multiple health conditions.   Objective:   Blood pressure 103/67, pulse 80, temperature 97.7 F (36.5 C), height 5' (1.524 m), weight 167 lb (75.8 kg), SpO2 95 %. Body mass index is 32.61 kg/m.  General: Cooperative, alert, well developed, in no acute distress. HEENT: Conjunctivae and lids unremarkable. Cardiovascular: Regular rhythm.  Lungs: Normal work of breathing. Neurologic: No focal deficits.   Lab Results  Component Value Date   CREATININE 0.64 11/19/2020   BUN 12 11/19/2020   NA 136 11/19/2020   K 4.4 11/19/2020  CL 101 11/19/2020   CO2 19 (L) 11/19/2020   Lab Results  Component Value Date   ALT 13 11/19/2020   AST 18 11/19/2020   ALKPHOS 77 11/19/2020   BILITOT 0.6 11/19/2020   Lab Results  Component Value Date   HGBA1C 6.8 (H) 11/19/2020   HGBA1C 7.4 (H) 08/15/2020   HGBA1C 7.3 (H) 04/23/2020   HGBA1C 8.5 (A) 01/25/2020   HGBA1C 8.3 (A) 07/26/2019   Lab Results  Component Value Date   INSULIN 9.3 11/19/2020   Lab Results   Component Value Date   TSH 3.020 11/19/2020   Lab Results  Component Value Date   CHOL 95 (L) 11/19/2020   HDL 38 (L) 11/19/2020   LDLCALC 35 11/19/2020   TRIG 120 11/19/2020   CHOLHDL 2.5 11/19/2020   Lab Results  Component Value Date   WBC 9.8 11/19/2020   HGB 13.1 11/19/2020   HCT 39.3 11/19/2020   MCV 79 11/19/2020   PLT 329 11/19/2020   Attestation Statements:   Reviewed by clinician on day of visit: allergies, medications, problem list, medical history, surgical history, family history, social history, and previous encounter notes.  I, Insurance claims handler, CMA, am acting as Energy manager for [employer], DO.  I have reviewed the above documentation for accuracy and completeness, and I agree with the above. Carlye Grippe, D.O.  The 21st Century Cures Act was signed into law in 2016 which includes the topic of electronic health records.  This provides immediate access to information in MyChart.  This includes consultation notes, operative notes, office notes, lab results and pathology reports.  If you have any questions about what you read please let us know at your next visit so we can discuss your concerns and take corrective action if need be.  We are right here with you.

## 2021-04-02 ENCOUNTER — Encounter (INDEPENDENT_AMBULATORY_CARE_PROVIDER_SITE_OTHER): Payer: Self-pay | Admitting: Family Medicine

## 2021-04-02 ENCOUNTER — Other Ambulatory Visit: Payer: Self-pay

## 2021-04-02 ENCOUNTER — Ambulatory Visit (INDEPENDENT_AMBULATORY_CARE_PROVIDER_SITE_OTHER): Payer: Managed Care, Other (non HMO) | Admitting: Family Medicine

## 2021-04-02 VITALS — BP 99/68 | HR 77 | Temp 98.3°F | Ht 60.0 in | Wt 162.0 lb

## 2021-04-02 DIAGNOSIS — Z6833 Body mass index (BMI) 33.0-33.9, adult: Secondary | ICD-10-CM | POA: Diagnosis not present

## 2021-04-02 DIAGNOSIS — I10 Essential (primary) hypertension: Secondary | ICD-10-CM | POA: Diagnosis not present

## 2021-04-02 DIAGNOSIS — E559 Vitamin D deficiency, unspecified: Secondary | ICD-10-CM | POA: Diagnosis not present

## 2021-04-02 DIAGNOSIS — E669 Obesity, unspecified: Secondary | ICD-10-CM | POA: Diagnosis not present

## 2021-04-02 DIAGNOSIS — Z9189 Other specified personal risk factors, not elsewhere classified: Secondary | ICD-10-CM | POA: Diagnosis not present

## 2021-04-02 MED ORDER — VITAMIN D (ERGOCALCIFEROL) 1.25 MG (50000 UNIT) PO CAPS: ORAL_CAPSULE | ORAL | 0 refills | Status: DC

## 2021-04-08 ENCOUNTER — Other Ambulatory Visit (INDEPENDENT_AMBULATORY_CARE_PROVIDER_SITE_OTHER): Payer: Managed Care, Other (non HMO)

## 2021-04-08 ENCOUNTER — Other Ambulatory Visit: Payer: Self-pay

## 2021-04-08 DIAGNOSIS — E669 Obesity, unspecified: Secondary | ICD-10-CM | POA: Diagnosis not present

## 2021-04-08 DIAGNOSIS — E1169 Type 2 diabetes mellitus with other specified complication: Secondary | ICD-10-CM | POA: Diagnosis not present

## 2021-04-08 LAB — BASIC METABOLIC PANEL
BUN: 16 mg/dL (ref 6–23)
CO2: 23 mEq/L (ref 19–32)
Calcium: 10.9 mg/dL — ABNORMAL HIGH (ref 8.4–10.5)
Chloride: 104 mEq/L (ref 96–112)
Creatinine, Ser: 0.7 mg/dL (ref 0.40–1.20)
GFR: 97.31 mL/min (ref >60.00)
Glucose, Bld: 99 mg/dL (ref 70–99)
Potassium: 4.4 mEq/L (ref 3.5–5.1)
Sodium: 137 mEq/L (ref 135–145)

## 2021-04-08 LAB — HEMOGLOBIN A1C: Hgb A1c MFr Bld: 7 % — ABNORMAL HIGH (ref 4.6–6.5)

## 2021-04-10 ENCOUNTER — Ambulatory Visit: Payer: Managed Care, Other (non HMO) | Admitting: Endocrinology

## 2021-04-14 NOTE — Progress Notes (Signed)
Chief Complaint:   OBESITY [name] [name] is here to discuss her progress with her obesity treatment plan along with follow-up of her obesity related diagnoses.   Today's visit was #: 6 Starting weight: 172 lbs Starting date: 11/19/2020 Today's weight: 162 lbs Today's date: 04/02/2021 Weight change since last visit: 5 lbs Total lbs lost to date: 10 lbs Body mass index is 31.64 kg/m.  Total weight loss percentage to date: -5.81%  Interim History:  [name] [name] says that journaling worked well.  Some days, she has very mild hunger/little cravings, but other days, she found it hard to eat all the food.  Difficult to hit protein goals 50% of the time.    Plan:  Getting more protein in.  Continue to track accurately and increase activity.  Current Meal Plan: keeping a food journal and adhering to recommended goals of 1000-1100 calories and 80 grams of protein for 100% of the time.  Current Exercise Plan: Cardio for 45 minutes 2 times per week. Current Anti-Obesity Medications: Rybelsus 7 mg daily. Side effects: None.  Assessment/Plan:   Medications Discontinued During This Encounter  Medication Reason   Vitamin D, Ergocalciferol, (DRISDOL) 1.25 MG (50000 UNIT) CAPS capsule Reorder   Meds ordered this encounter  Medications   Vitamin D, Ergocalciferol, (DRISDOL) 1.25 MG (50000 UNIT) CAPS capsule    Sig: 1 po q 7 d    Dispense:  4 capsule    Refill:  0    (OV for RF)   1. Vitamin D deficiency Improving, but not optimized. Current vitamin D is 48.5, tested on 11/19/2020. Optimal goal > 50 ng/dL.  She is taking vitamin D 50,000 IU weekly.  Plan: Continue to take prescription Vitamin D @50 ,000 IU every week as prescribed.  Follow-up for routine testing of Vitamin D, at least 2-3 times per year to avoid over-replacement.  - Refill Vitamin D, Ergocalciferol, (DRISDOL) 1.25 MG (50000 UNIT) CAPS capsule; 1 po q 7 d  Dispense: 4 capsule; Refill: 0  2. Essential hypertension At goal. Medications:  Norvasc 10 mg daily, metoprolol 100 mg daily, and valsartan 160 mg daily.  No symptoms or concerns.  Denies dizziness.  Plan:  Continue medications for now.  Avoid buying foods that are: processed, frozen, or prepackaged to avoid excess salt. Ambulatory blood pressure monitoring was encouraged with a goal of at least 2-3 times weekly or when feeling poorly.  She was instructed to keep a log for to review at each office visit.    BP Readings from Last 3 Encounters:  04/02/21 99/68  03/12/21 103/67  02/05/21 122/68   Lab Results  Component Value Date   CREATININE 0.70 04/08/2021   3. At risk for complication associated with hypotension [name] [name] was given approximately 9 minutes of education and counseling today regarding the condition of hypotension, the pathophysiology of the condition and concerns regarding prevention and treatment of this condition.  We discussed risks of medications used to treat hypertension, which in the setting of weight loss, can inadvertently cause hypotension.  Signs of hypotension such as feeling lightheaded or unsteady, esp when getting up first thing in the AM or off the toilet, were reviewed in detail and all questions were answered.  The use of motivational interviewing was employed as a technique as well today to aid in the treatment of patient's multiple conditions.  Pt understands importance of proper hydration and also of more prudent home BP monitoring while actively losing weight.  she will call Delice Bison, or  their PCP,  or other specialists who treat their BP with medications, with any questions or concerns that may develop.    4. Class 1 obesity with serious comorbidity and body mass index (BMI) of 33.0 to 33.9 in adult, unspecified obesity type  Course: [name] [name] is currently in the action stage of change. As such, her goal is to continue with weight loss efforts.   Nutrition goals: She has agreed to keeping a food journal and adhering to recommended goals of 1000-1100  calories and 80 grams of protein.   Exercise goals:  As is, but increase as tolerated.  Behavioral modification strategies: meal planning and cooking strategies and planning for success.  [name] has agreed to follow-up with our clinic in 3 weeks. She was informed of the importance of frequent follow-up visits to maximize her success with intensive lifestyle modifications for her multiple health conditions.   Objective:   Blood pressure 99/68, pulse 77, temperature 98.3 F (36.8 C), height 5' (1.524 m), weight 162 lb (73.5 kg), SpO2 100 %. Body mass index is 31.64 kg/m.  General: Cooperative, alert, well developed, in no acute distress. HEENT: Conjunctivae and lids unremarkable. Cardiovascular: Regular rhythm.  Lungs: Normal work of breathing. Neurologic: No focal deficits.   Lab Results  Component Value Date   CREATININE 0.70 04/08/2021   BUN 16 04/08/2021   NA 137 04/08/2021   K 4.4 04/08/2021   CL 104 04/08/2021   CO2 23 04/08/2021   Lab Results  Component Value Date   ALT 13 11/19/2020   AST 18 11/19/2020   ALKPHOS 77 11/19/2020   BILITOT 0.6 11/19/2020   Lab Results  Component Value Date   HGBA1C 7.0 (H) 04/08/2021   HGBA1C 6.8 (H) 11/19/2020   HGBA1C 7.4 (H) 08/15/2020   HGBA1C 7.3 (H) 04/23/2020   HGBA1C 8.5 (A) 01/25/2020   Lab Results  Component Value Date   INSULIN 9.3 11/19/2020   Lab Results  Component Value Date   TSH 3.020 11/19/2020   Lab Results  Component Value Date   CHOL 95 (L) 11/19/2020   HDL 38 (L) 11/19/2020   LDLCALC 35 11/19/2020   TRIG 120 11/19/2020   CHOLHDL 2.5 11/19/2020   Lab Results  Component Value Date   WBC 9.8 11/19/2020   HGB 13.1 11/19/2020   HCT 39.3 11/19/2020   MCV 79 11/19/2020   PLT 329 11/19/2020   Attestation Statements:   Reviewed by clinician on day of visit: allergies, medications, problem list, medical history, surgical history, family history, social history, and previous encounter notes.  I, Comptroller, CMA, am acting as Energy manager for [employer], DO.  I have reviewed the above documentation for accuracy and completeness, and I agree with the above. Carlye Grippe, D.O.  The 21st Century Cures Act was signed into law in 2016 which includes the topic of electronic health records.  This provides immediate access to information in MyChart.  This includes consultation notes, operative notes, office notes, lab results and pathology reports.  If you have any questions about what you read please let us know at your next visit so we can discuss your concerns and take corrective action if need be.  We are right here with you.

## 2021-04-18 ENCOUNTER — Other Ambulatory Visit: Payer: Self-pay

## 2021-04-18 ENCOUNTER — Encounter: Payer: Self-pay | Admitting: Endocrinology

## 2021-04-18 ENCOUNTER — Ambulatory Visit: Payer: Managed Care, Other (non HMO) | Admitting: Endocrinology

## 2021-04-18 VITALS — BP 122/78 | HR 81 | Ht 60.0 in | Wt 169.2 lb

## 2021-04-18 DIAGNOSIS — E7800 Pure hypercholesterolemia, unspecified: Secondary | ICD-10-CM

## 2021-04-18 DIAGNOSIS — E1165 Type 2 diabetes mellitus with hyperglycemia: Secondary | ICD-10-CM | POA: Diagnosis not present

## 2021-04-18 DIAGNOSIS — E213 Hyperparathyroidism, unspecified: Secondary | ICD-10-CM

## 2021-04-18 NOTE — Progress Notes (Signed)
Patient ID: [name] [name], female   DOB: 1966/02/25, 55 y.o.   MRN: [id number]              Chief complaint: Endocrinology follow-up  History of Present Illness:  Problem 1: DIABETES type II  She was initially diagnosed in 2017  Current management, blood sugar readings, problems identified:  Medication regimen: Metformin ER 750 mg twice daily, Rybelsus 7 mg in a.m.  A1c is 7 compared to 6.8  Lab glucose was 99 late morning but at home she is mostly checking her blood sugars right after breakfast Using her [employer] meter as before Although she previously had lost weight with starting Rybelsus this has leveled off  She is still working with the weight loss program and is usually watching her diet well with reduced portions She tries to take her lunch to work although may have some small snacks in between like nuts Evening meal is lighter Again no nausea from 7 mg Rybelsus She is trying to go to the gym about 2 days a week but trying to walk up to 1 hour in the evenings   Last consultation with dietitian: 7/21  Blood sugar data from the  faxed information   PRE-MEAL Morning Lunch Dinner Bedtime Overall  Glucose range: 108-137  118    Mean/median:     125   Previously:  PRE-MEAL  before meals Lunch Dinner Bedtime Overall  Glucose range:  94-127     94-153  Mean/median:  118       POST-MEAL PC Breakfast PC Lunch  postprandial  Glucose range:    97-153  Mean/median:         Wt Readings from Last 3 Encounters:  04/18/21 169 lb 3.2 oz (76.7 kg)  04/02/21 162 lb (73.5 kg)  03/12/21 167 lb (75.8 kg)    Lab Results  Component Value Date   HGBA1C 7.0 (H) 04/08/2021   HGBA1C 6.8 (H) 11/19/2020   HGBA1C 7.4 (H) 08/15/2020   Lab Results  Component Value Date   MICROALBUR 3.0 (H) 08/15/2020   LDLCALC 35 11/19/2020   CREATININE 0.70 04/08/2021    HYPERCALCEMIA:  Review of records show that she had a high calcium of 11.2 done in October 2017 The patient  thinks that she has had higher calcium before also but no records are available Apparently at some point she was taking HCTZ and this was stopped with some improvement in calcium  PTH level from unknown lab 29 done in 10/17 and subsequently 32 in 8/19  RECENT history: Her serum calcium has been generally high and ranging between 10.5 -11.4 since 12/18 More recently levels have been below 11  Has been asymptomatic as before with no history of fractures or kidney stones No osteoporosis on previous bone density from 09/2017  Lab Results  Component Value Date   CALCIUM 10.9 (H) 04/08/2021   CALCIUM 10.8 (H) 11/19/2020   CALCIUM 11.4 (H) 08/15/2020   CALCIUM 11.0 (H) 04/23/2020   CALCIUM 10.7 (H) 01/18/2020   CALCIUM 11.2 (H) 07/24/2019   CALCIUM 10.5 03/03/2019   CALCIUM 11.1 (H) 11/22/2018   CALCIUM 11.3 (H) 07/22/2018   Lab Results  Component Value Date   PTH 32 05/23/2018   CALCIUM 10.9 (H) 04/08/2021   CAION 1.31 07/31/2017   PHOS 3.2 10/01/2017       VITAMIN D deficiency:  She has been on vitamin D supplements for persistently low levels below 20 since at least 2018  She is taking  taking vitamin D3, 5000 units  Also in addition to her OTC supplement she is taking prescription 50,000 units weekly This was started on her visit in 5/20 and her level at that time was about 20  Vitamin D level is recently back to normal  Lab Results  Component Value Date   VD25OH 48.5 11/19/2020   VD25OH 29.29 (L) 08/15/2020   VD25OH 35.56 01/18/2020   VD25OH 36.42 07/24/2019   VD25OH 19.97 (L) 03/03/2019        Allergies as of 04/18/2021       Reactions   Penicillins Hives   Has patient had a PCN reaction causing immediate rash, facial/tongue/throat swelling, SOB or lightheadedness with hypotension: unkn Has patient had a PCN reaction causing severe rash involving mucus membranes or skin necrosis: unkn Has patient had a PCN reaction that required hospitalization: no Has  patient had a PCN reaction occurring within the last 10 years: no If all of the above answers are "NO", then may proceed with Cephalosporin use.        Medication List        Accurate as of April 18, 2021  9:43 AM. If you have any questions, ask your nurse or doctor.          amLODipine 10 MG tablet Commonly known as: NORVASC Take 1 tablet (10 mg total) by mouth daily.   aspirin 81 MG EC tablet Take 1 tablet (81 mg total) by mouth daily.   atorvastatin 20 MG tablet Commonly known as: LIPITOR Take 1 tablet (20 mg total) by mouth daily.   metFORMIN 500 MG 24 hr tablet Commonly known as: GLUCOPHAGE-XR Take 1,000 mg by mouth 2 (two) times daily.   metoprolol tartrate 100 MG tablet Commonly known as: LOPRESSOR Take 1 tablet (100 mg total) by mouth daily.   Repatha SureClick 140 MG/ML Soaj Generic drug: Evolocumab Inject 140 mg into the skin every 14 (fourteen) days.   Rybelsus 7 MG Tabs Generic drug: Semaglutide TAKE 1 TABLET DAILY BEFORE BREAKFAST. TAKE 30 MINUTES BEFORE BREAKFAST WITH WATER.   valsartan 160 MG tablet Commonly known as: DIOVAN TAKE 1 TABLET BY MOUTH EVERYDAY AT BEDTIME   Vitamin D (Ergocalciferol) 1.25 MG (50000 UNIT) Caps capsule Commonly known as: DRISDOL 1 po q 7 d        Allergies:  Allergies  Allergen Reactions   Penicillins Hives    Has patient had a PCN reaction causing immediate rash, facial/tongue/throat swelling, SOB or lightheadedness with hypotension: unkn Has patient had a PCN reaction causing severe rash involving mucus membranes or skin necrosis: unkn Has patient had a PCN reaction that required hospitalization: no Has patient had a PCN reaction occurring within the last 10 years: no If all of the above answers are "NO", then may proceed with Cephalosporin use.     Past Medical History:  Diagnosis Date   Anxiety    Constipation    Coronary artery disease    Diabetes mellitus without complication (HCC)    Heart disease     History of heart attack    Hyperlipidemia    Hypertension    Obesity    SOBOE (shortness of breath on exertion)    Swelling of both lower extremities    Trouble in sleeping    Vitamin D deficiency     Past Surgical History:  Procedure Laterality Date   CARDIAC CATHETERIZATION     CESAREAN SECTION  12/13/1998   CORONARY ARTERY BYPASS GRAFT N/A 07/30/2017  Procedure: CORONARY ARTERY BYPASS GRAFTING (CABG)  x four, using right internal mammary artery, right greater saphenous vein, left radial artery;  Surgeon: Loreli Slot, MD;  Location: [facility] OR;  Service: Open Heart Surgery;  Laterality: N/A;   INTRAOPERATIVE TRANSESOPHAGEAL ECHOCARDIOGRAM N/A 07/30/2017   Procedure: INTRAOPERATIVE TRANSESOPHAGEAL ECHOCARDIOGRAM;  Surgeon: Loreli Slot, MD;  Location: [facility] OR;  Service: Open Heart Surgery;  Laterality: N/A;   LEFT HEART CATH AND CORONARY ANGIOGRAPHY N/A 07/29/2017   Procedure: LEFT HEART CATH AND CORONARY ANGIOGRAPHY;  Surgeon: [place]az[place], Peter M, MD;  Location: [facility] INVASIVE CV LAB;  Service: Cardiovascular;  Laterality: N/A;   LEFT HEART CATH AND CORS/GRAFTS ANGIOGRAPHY N/A 07/25/2018   Procedure: LEFT HEART CATH AND CORS/GRAFTS ANGIOGRAPHY;  Surgeon: Runell Gess, MD;  Location: [facility] INVASIVE CV LAB;  Service: Cardiovascular;  Laterality: N/A;   MYOMECTOMY  09/1994   PARTIAL HYSTERECTOMY  2005   RADIAL ARTERY HARVEST Left 07/30/2017   Procedure: RADIAL ARTERY HARVEST;  Surgeon: Loreli Slot, MD;  Location: [facility] OR;  Service: Open Heart Surgery;  Laterality: Left;    Family History  Problem Relation Age of Onset   Hypertension Other    Hypertension Mother    Hyperlipidemia Mother    Obesity Mother    Diabetes Father    Hypertension Father    Hyperlipidemia Father     Social History:  reports that she has never smoked. She has never used smokeless tobacco. She reports current alcohol use. She reports that she does not use drugs.  Review of  Systems  HYPERCHOLESTEROLEMIA: Followed by PCP/cardiologist   She does have a history of CAD and now she is on Repatha also along with reduced dose of Lipitor 20 mg. She does not like the Repatha injections as she has some needle phobia   Lab Results  Component Value Date   CHOL 95 (L) 11/19/2020   CHOL 161 02/21/2020   CHOL 156 11/23/2017   Lab Results  Component Value Date   HDL 38 (L) 11/19/2020   HDL 36 (L) 02/21/2020   HDL 36 (L) 11/23/2017   Lab Results  Component Value Date   LDLCALC 35 11/19/2020   LDLCALC 104 (H) 02/21/2020   LDLCALC 92 11/23/2017   Lab Results  Component Value Date   TRIG 120 11/19/2020   TRIG 113 02/21/2020   TRIG 140 11/23/2017   Lab Results  Component Value Date   CHOLHDL 2.5 11/19/2020   CHOLHDL 4.5 (H) 02/21/2020   CHOLHDL 4.3 11/23/2017   No results found for: LDLDIRECT    EXAM:  BP 122/78   Pulse 81   Ht 5' (1.524 m)   Wt 169 lb 3.2 oz (76.7 kg)   SpO2 99%   BMI 33.04 kg/m    Assessment/Plan:   DIABETES type 2, non-insulin-dependent:  Her A1c is 6.8 compared to 7.4  She is continuing on a regimen of Rybelsus 7 mg in the morning and Metformin ER  Her blood sugars are generally well controlled although not checking after meals Weight loss has leveled off She is trying to do some regular exercise but not clear why A1c is trending higher  Since she is eating small portions generally will not increase Rybelsus at this time She will start checking more readings after meals and discussed what the target should be Continue regular exercise Call if blood sugars are consistently higher after meals  HYPERCALCEMIA: Quite stable below 11, likely still mild hyperparathyroidism Will need bone density next year  HYPERCHOLESTEROLEMIA: followed by cardiologist.  Discussed that if she does not like the pen injector for the Repatha she can be switched to the monthly pump device as she has needles phobia    [name] [name] 04/18/2021, 9:43 AM     Note: This office note was prepared with Dragon voice recognition system technology. Any transcriptional errors that result from this process are unintentional.

## 2021-04-21 ENCOUNTER — Other Ambulatory Visit: Payer: Self-pay | Admitting: Cardiovascular Disease

## 2021-04-30 ENCOUNTER — Ambulatory Visit (INDEPENDENT_AMBULATORY_CARE_PROVIDER_SITE_OTHER): Payer: Managed Care, Other (non HMO) | Admitting: Family Medicine

## 2021-05-14 ENCOUNTER — Encounter (INDEPENDENT_AMBULATORY_CARE_PROVIDER_SITE_OTHER): Payer: Self-pay | Admitting: Family Medicine

## 2021-05-14 ENCOUNTER — Ambulatory Visit (INDEPENDENT_AMBULATORY_CARE_PROVIDER_SITE_OTHER): Payer: Managed Care, Other (non HMO) | Admitting: Family Medicine

## 2021-05-14 ENCOUNTER — Other Ambulatory Visit: Payer: Self-pay

## 2021-05-14 ENCOUNTER — Ambulatory Visit: Payer: Managed Care, Other (non HMO) | Admitting: Endocrinology

## 2021-05-14 VITALS — BP 114/79 | HR 76 | Temp 98.0°F | Ht 60.0 in | Wt 167.0 lb

## 2021-05-14 DIAGNOSIS — E559 Vitamin D deficiency, unspecified: Secondary | ICD-10-CM | POA: Diagnosis not present

## 2021-05-14 DIAGNOSIS — Z6833 Body mass index (BMI) 33.0-33.9, adult: Secondary | ICD-10-CM | POA: Diagnosis not present

## 2021-05-14 DIAGNOSIS — I10 Essential (primary) hypertension: Secondary | ICD-10-CM

## 2021-05-14 DIAGNOSIS — E669 Obesity, unspecified: Secondary | ICD-10-CM | POA: Diagnosis not present

## 2021-05-14 DIAGNOSIS — Z9189 Other specified personal risk factors, not elsewhere classified: Secondary | ICD-10-CM | POA: Diagnosis not present

## 2021-05-14 MED ORDER — VITAMIN D (ERGOCALCIFEROL) 1.25 MG (50000 UNIT) PO CAPS: ORAL_CAPSULE | ORAL | 0 refills | Status: DC

## 2021-05-19 NOTE — Progress Notes (Signed)
Chief Complaint:   OBESITY [name] [name] is here to discuss her progress with her obesity treatment plan along with follow-up of her obesity related diagnoses. [name] [name] is on keeping a food journal and adhering to recommended goals of 1000-1100 calories and 80 grams protein and states she is following her eating plan approximately 60% of the time. [name] [name] states she is going to the gym 45-60 minutes 3 times per week.  Today's visit was #: 7 Starting weight: 172 lbs Starting date: 11/19/2020 Today's weight: 167 lbs Today's date: 05/14/2021 Total lbs lost to date: 5 Total lbs lost since last in-office visit: +5  Interim History: [name] [name] has been traveling a lot with work for 1 week and on vacation as well. She knows why she gained. She did not journal during the past 6 weeks (was lost to regular follow up).    Subjective:   1. Essential hypertension Review: taking medications as instructed, no medication side effects noted, no chest pain on exertion, no dyspnea on exertion, no swelling of ankles.   BP Readings from Last 3 Encounters:  05/14/21 114/79  04/18/21 122/78  04/02/21 99/68   Lab Results  Component Value Date   CREATININE 0.70 04/08/2021   CREATININE 0.64 11/19/2020   CREATININE 0.73 08/15/2020   2. Vitamin D deficiency She is currently taking prescription vitamin D 50,000 IU each week. She denies nausea, vomiting or muscle weakness.  Lab Results  Component Value Date   VD25OH 48.5 11/19/2020   VD25OH 29.29 (L) 08/15/2020   VD25OH 35.56 01/18/2020   3. At risk for anxiety [name] [name] is at risk of developing anxiety due to stress, personal and or family history or current situation.  Assessment/Plan:  No orders of the defined types were placed in this encounter.   Medications Discontinued During This Encounter  Medication Reason   Vitamin D, Ergocalciferol, (DRISDOL) 1.25 MG (50000 UNIT) CAPS capsule Reorder     Meds ordered this encounter  Medications   Vitamin D,  Ergocalciferol, (DRISDOL) 1.25 MG (50000 UNIT) CAPS capsule    Sig: 1 po q 7 d    Dispense:  4 capsule    Refill:  0    (OV for RF)     1. Essential hypertension At goal. [name] [name] is working on healthy weight loss and exercise to improve blood pressure control. We will watch for signs of hypotension as she continues her lifestyle modifications. Continue current treatment plan.  2. Vitamin D deficiency Low Vitamin D level contributes to fatigue and are associated with obesity, breast, and colon cancer. She agrees to continue to take prescription Vitamin D @50 ,000 IU every week and will follow-up for routine testing of Vitamin D, at least 2-3 times per year to avoid over-replacement.  Refill- Vitamin D, Ergocalciferol, (DRISDOL) 1.25 MG (50000 UNIT) CAPS capsule; 1 po q 7 d  Dispense: 4 capsule; Refill: 0  3. At risk for anxiety [name] [name] was given approximately 9 minutes of anxiety risk counseling today. She has risk factors for anxiety. We discussed the importance of a healthy work life balance, a healthy relationship with food and a good support system.  Repetitive spaced learning was employed today to elicit superior memory formation and behavioral change.   4. Obesity with current BMI of 32.7  [name] [name] is currently in the action stage of change. As such, her goal is to continue with weight loss efforts. She has agreed to keeping a food journal and adhering to recommended goals of 1000-1100 calories and 80  grams protein.   Exercise goals:  As is  Behavioral modification strategies: meal planning and cooking strategies, keeping healthy foods in the home, planning for success, and keeping a strict food journal.  [name] [name] has agreed to follow-up with our clinic in 3 weeks. She was informed of the importance of frequent follow-up visits to maximize her success with intensive lifestyle modifications for her multiple health conditions.   Objective:   Blood pressure 114/79, pulse 76, temperature 98 F  (36.7 C), height 5' (1.524 m), weight 167 lb (75.8 kg), SpO2 98 %. Body mass index is 32.61 kg/m.  General: Cooperative, alert, well developed, in no acute distress. HEENT: Conjunctivae and lids unremarkable. Cardiovascular: Regular rhythm.  Lungs: Normal work of breathing. Neurologic: No focal deficits.   Lab Results  Component Value Date   CREATININE 0.70 04/08/2021   BUN 16 04/08/2021   NA 137 04/08/2021   K 4.4 04/08/2021   CL 104 04/08/2021   CO2 23 04/08/2021   Lab Results  Component Value Date   ALT 13 11/19/2020   AST 18 11/19/2020   ALKPHOS 77 11/19/2020   BILITOT 0.6 11/19/2020   Lab Results  Component Value Date   HGBA1C 7.0 (H) 04/08/2021   HGBA1C 6.8 (H) 11/19/2020   HGBA1C 7.4 (H) 08/15/2020   HGBA1C 7.3 (H) 04/23/2020   HGBA1C 8.5 (A) 01/25/2020   Lab Results  Component Value Date   INSULIN 9.3 11/19/2020   Lab Results  Component Value Date   TSH 3.020 11/19/2020   Lab Results  Component Value Date   CHOL 95 (L) 11/19/2020   HDL 38 (L) 11/19/2020   LDLCALC 35 11/19/2020   TRIG 120 11/19/2020   CHOLHDL 2.5 11/19/2020   Lab Results  Component Value Date   VD25OH 48.5 11/19/2020   VD25OH 29.29 (L) 08/15/2020   VD25OH 35.56 01/18/2020   Lab Results  Component Value Date   WBC 9.8 11/19/2020   HGB 13.1 11/19/2020   HCT 39.3 11/19/2020   MCV 79 11/19/2020   PLT 329 11/19/2020    Attestation Statements:   Reviewed by clinician on day of visit: allergies, medications, problem list, medical history, surgical history, family history, social history, and previous encounter notes.  Edmund Hilda, CMA, am acting as transcriptionist for [employer], DO.  I have reviewed the above documentation for accuracy and completeness, and I agree with the above. Carlye Grippe, D.O.  The 21st Century Cures Act was signed into law in 2016 which includes the topic of electronic health records.  This provides immediate access to information  in MyChart.  This includes consultation notes, operative notes, office notes, lab results and pathology reports.  If you have any questions about what you read please let us know at your next visit so we can discuss your concerns and take corrective action if need be.  We are right here with you.

## 2021-05-20 ENCOUNTER — Other Ambulatory Visit: Payer: Self-pay | Admitting: Endocrinology

## 2021-06-04 ENCOUNTER — Ambulatory Visit (INDEPENDENT_AMBULATORY_CARE_PROVIDER_SITE_OTHER): Payer: Managed Care, Other (non HMO) | Admitting: Family Medicine

## 2021-06-30 ENCOUNTER — Other Ambulatory Visit: Payer: Self-pay

## 2021-06-30 ENCOUNTER — Ambulatory Visit (INDEPENDENT_AMBULATORY_CARE_PROVIDER_SITE_OTHER): Payer: Managed Care, Other (non HMO) | Admitting: Family Medicine

## 2021-06-30 ENCOUNTER — Encounter (INDEPENDENT_AMBULATORY_CARE_PROVIDER_SITE_OTHER): Payer: Self-pay | Admitting: Family Medicine

## 2021-06-30 VITALS — BP 112/75 | HR 71 | Temp 98.0°F | Ht 60.0 in | Wt 166.0 lb

## 2021-06-30 DIAGNOSIS — R4589 Other symptoms and signs involving emotional state: Secondary | ICD-10-CM

## 2021-06-30 DIAGNOSIS — Z6833 Body mass index (BMI) 33.0-33.9, adult: Secondary | ICD-10-CM

## 2021-06-30 DIAGNOSIS — E559 Vitamin D deficiency, unspecified: Secondary | ICD-10-CM | POA: Diagnosis not present

## 2021-06-30 DIAGNOSIS — E669 Obesity, unspecified: Secondary | ICD-10-CM | POA: Diagnosis not present

## 2021-06-30 DIAGNOSIS — Z9189 Other specified personal risk factors, not elsewhere classified: Secondary | ICD-10-CM | POA: Diagnosis not present

## 2021-06-30 DIAGNOSIS — E1169 Type 2 diabetes mellitus with other specified complication: Secondary | ICD-10-CM | POA: Diagnosis not present

## 2021-06-30 MED ORDER — VITAMIN D (ERGOCALCIFEROL) 1.25 MG (50000 UNIT) PO CAPS: ORAL_CAPSULE | ORAL | 0 refills | Status: DC

## 2021-06-30 MED ORDER — BUPROPION HCL ER (SR) 100 MG PO TB12: ORAL_TABLET | ORAL | 0 refills | Status: DC

## 2021-07-01 NOTE — Progress Notes (Signed)
Chief Complaint:   OBESITY [name] [name] is here to discuss her progress with her obesity treatment plan along with follow-up of her obesity related diagnoses. [name] [name] is on the Category 1 Plan and keeping a food journal and adhering to recommended goals of [phone] calories and 80 grams protein and states she is following her eating plan approximately 60-65% of the time. [name] [name] states she is not currently exercising.  Today's visit was #: 8 Starting weight: 172 lbs Starting date: 11/19/2020 Today's weight: 166 lbs Today's date: 06/30/2021 Total lbs lost to date: 6 Total lbs lost since last in-office visit: 1  Interim History: [name] [name] has had increased stress and is unable to focus on herself lately due to caring for family members. She is not following the meal plan well.  Subjective:   1. Type 2 diabetes mellitus with other specified complication, without long-term current use of insulin (HCC) Managed by Dr. Lucianne Muss. Blood sugars 2 hours post prandial = 140's, with no highs or lows. Medication: Rybelsus and Metformin.  2. Depressed mood with emotional eating Pt has had a lot of increased stressors in life and is "crying for stress relief". She has never been on meds in the past and is having emotional eating episodes ([place]).  3. Vitamin D deficiency She is currently taking prescription vitamin D 50,000 IU each week. She denies nausea, vomiting or muscle weakness.  Lab Results  Component Value Date   VD25OH 48.5 11/19/2020   VD25OH 29.29 (L) 08/15/2020   VD25OH 35.56 01/18/2020   4. At risk for anxiety [name] [name] is at risk of developing anxiety due to stress, personal and or family history or current situation.  Assessment/Plan:  No orders of the defined types were placed in this encounter.   Medications Discontinued During This Encounter  Medication Reason   Vitamin D, Ergocalciferol, (DRISDOL) 1.25 MG (50000 UNIT) CAPS capsule Reorder     Meds ordered this encounter  Medications    Vitamin D, Ergocalciferol, (DRISDOL) 1.25 MG (50000 UNIT) CAPS capsule    Sig: 1 po q 7 d    Dispense:  4 capsule    Refill:  0    (OV for RF)   buPROPion ER (WELLBUTRIN SR) 100 MG 12 hr tablet    Sig: 1 po q am with breakfast    Dispense:  30 tablet    Refill:  0    30 d supply;  ** OV for RF **   Do not send RF request     1. Type 2 diabetes mellitus with other specified complication, without long-term current use of insulin (HCC) Good blood sugar control is important to decrease the likelihood of diabetic complications such as nephropathy, neuropathy, limb loss, blindness, coronary artery disease, and death. Intensive lifestyle modification including diet, exercise and weight loss are the first line of treatment for diabetes.  Per pt, she has an appt with Dr. Lucianne Muss in the next couple of weeks. He will be rechecking A1c at that time. Continue prudent nutritional plan and weight loss.  2. Depressed mood with emotional eating Risks and benefits of Wellbutrin discussed with pt. Pt will start low dose Wellbutrin SR 100 mg, due to feeling overwhelmed, unfocused, and tearful at time with emotional eating.  Start- buPROPion ER (WELLBUTRIN SR) 100 MG 12 hr tablet; 1 po q am with breakfast  Dispense: 30 tablet; Refill: 0  3. Vitamin D deficiency Low Vitamin D level contributes to fatigue and are associated with obesity, breast, and colon  cancer. She agrees to continue to take prescription Vitamin D 50,000 IU every week and will follow-up for routine testing of Vitamin D, at least 2-3 times per year to avoid over-replacement.  Refill- Vitamin D, Ergocalciferol, (DRISDOL) 1.25 MG (50000 UNIT) CAPS capsule; 1 po q 7 d  Dispense: 4 capsule; Refill: 0  4. At risk for anxiety [name] [name] was given approximately 15 minutes of anxiety risk counseling today. The patient has risk factors for this condition.  We discussed the importance of a healthy work/life balance, a healthy relationship with food,  and a good support system.  We discussed various strategies to help cope with these emotions as well.  I recommended counseling, meditation / prayer, healthy eating habits, sleep hygiene, and exercising to help manage these feelings.   5. Obesity with current BMI of 32.5  [name] [name] is currently in the action stage of change. As such, her goal is to continue with weight loss efforts. She has agreed to the Category 1 Plan and keeping a food journal and adhering to recommended goals of [phone] calories and 80+ grams protein.   Start new medication, Wellbutrin, focus on health/wellness, and better self-care during this stressful time.  Exercise goals:  Do 10 minutes of walking for stress relief.  Behavioral modification strategies: planning for success and keeping a strict food journal.  [name] [name] has agreed to follow-up with our clinic in 2 weeks. She was informed of the importance of frequent follow-up visits to maximize her success with intensive lifestyle modifications for her multiple health conditions.   Objective:   Blood pressure 112/75, pulse 71, temperature 98 F (36.7 C), height 5' (1.524 m), weight 166 lb (75.3 kg), SpO2 94 %. Body mass index is 32.42 kg/m.  General: Cooperative, alert, well developed, in no acute distress. HEENT: Conjunctivae and lids unremarkable. Cardiovascular: Regular rhythm.  Lungs: Normal work of breathing. Neurologic: No focal deficits.   Lab Results  Component Value Date   CREATININE 0.70 04/08/2021   BUN 16 04/08/2021   NA 137 04/08/2021   K 4.4 04/08/2021   CL 104 04/08/2021   CO2 23 04/08/2021   Lab Results  Component Value Date   ALT 13 11/19/2020   AST 18 11/19/2020   ALKPHOS 77 11/19/2020   BILITOT 0.6 11/19/2020   Lab Results  Component Value Date   HGBA1C 7.0 (H) 04/08/2021   HGBA1C 6.8 (H) 11/19/2020   HGBA1C 7.4 (H) 08/15/2020   HGBA1C 7.3 (H) 04/23/2020   HGBA1C 8.5 (A) 01/25/2020   Lab Results  Component Value Date   INSULIN  9.3 11/19/2020   Lab Results  Component Value Date   TSH 3.020 11/19/2020   Lab Results  Component Value Date   CHOL 95 (L) 11/19/2020   HDL 38 (L) 11/19/2020   LDLCALC 35 11/19/2020   TRIG 120 11/19/2020   CHOLHDL 2.5 11/19/2020   Lab Results  Component Value Date   VD25OH 48.5 11/19/2020   VD25OH 29.29 (L) 08/15/2020   VD25OH 35.56 01/18/2020   Lab Results  Component Value Date   WBC 9.8 11/19/2020   HGB 13.1 11/19/2020   HCT 39.3 11/19/2020   MCV 79 11/19/2020   PLT 329 11/19/2020    Attestation Statements:   Reviewed by clinician on day of visit: allergies, medications, problem list, medical history, surgical history, family history, social history, and previous encounter notes.  Edmund Hilda, CMA, am acting as transcriptionist for [employer], DO.  I have reviewed the above documentation  for accuracy and completeness, and I agree with the above. Marjory Sneddon, D.O.  The [place] was signed into law in 2016 which includes the topic of electronic health records.  This provides immediate access to information in MyChart.  This includes consultation notes, operative notes, office notes, lab results and pathology reports.  If you have any questions about what you read please let us know at your next visit so we can discuss your concerns and take corrective action if need be.  We are right here with you.

## 2021-07-14 ENCOUNTER — Other Ambulatory Visit: Payer: Self-pay

## 2021-07-14 ENCOUNTER — Encounter (INDEPENDENT_AMBULATORY_CARE_PROVIDER_SITE_OTHER): Payer: Self-pay | Admitting: Family Medicine

## 2021-07-14 ENCOUNTER — Telehealth (INDEPENDENT_AMBULATORY_CARE_PROVIDER_SITE_OTHER): Payer: Managed Care, Other (non HMO) | Admitting: Family Medicine

## 2021-07-14 VITALS — Ht 60.0 in

## 2021-07-14 DIAGNOSIS — Z6833 Body mass index (BMI) 33.0-33.9, adult: Secondary | ICD-10-CM

## 2021-07-14 DIAGNOSIS — E669 Obesity, unspecified: Secondary | ICD-10-CM | POA: Diagnosis not present

## 2021-07-14 DIAGNOSIS — E1169 Type 2 diabetes mellitus with other specified complication: Secondary | ICD-10-CM | POA: Diagnosis not present

## 2021-07-14 DIAGNOSIS — R4589 Other symptoms and signs involving emotional state: Secondary | ICD-10-CM

## 2021-07-14 DIAGNOSIS — E559 Vitamin D deficiency, unspecified: Secondary | ICD-10-CM

## 2021-07-14 MED ORDER — VITAMIN D (ERGOCALCIFEROL) 1.25 MG (50000 UNIT) PO CAPS: ORAL_CAPSULE | ORAL | 0 refills | Status: DC

## 2021-07-15 NOTE — Progress Notes (Signed)
TeleHealth Visit:  Due to the COVID-19 pandemic, this visit was completed with telemedicine (audio/video) technology to reduce patient and provider exposure as well as to preserve personal protective equipment.   [name] [name] has verbally consented to this TeleHealth visit. The patient is located at home, the provider is located at the [facility] office. The participants in this visit include the listed provider and patient. The visit was conducted today via video.   Chief Complaint: OBESITY [name] [name] is here to discuss her progress with her obesity treatment plan along with follow-up of her obesity related diagnoses. [name] [name] is on the Category 1 Plan and keeping a food journal and adhering to recommended goals of 1000-1100 calories and 80 grams protein and states she is following her eating plan approximately 80% of the time. [name] [name] states she is walking 15-20 minutes 7 times per week.  Today's visit was #: 9 Starting weight: 172 lbs Starting date: 11/19/2020  Interim History: [name] [name] has been sick since Friday and tested positive on Saturday. She is experiencing body aches, sinus congestion, and fatigue, but denies fever, cough, and SOB. Video visit today due to illness. She is hitting 70 grams protein per day.  Subjective:   1. Depressed mood with emotional eating Pt was prescribed Wellbutrin at last OV, but she never picked it up from the pharmacy. Pt states she no longer has emotional eating episodes and had been on plan. She recently weighed 167 lbs and runs 2 lbs lighter in the office.  2. Type 2 diabetes mellitus with other specified complication, without long-term current use of insulin (HCC) [name] [name] has sugar free candy that she eats. She denies issues or concerns with blood sugars today. Fasting blood sugar 110's, even with illness.  3. Vitamin D deficiency She is currently taking prescription vitamin D 50,000 IU each week. She denies nausea, vomiting or muscle weakness.  Lab  Results  Component Value Date   VD25OH 48.5 11/19/2020   VD25OH 29.29 (L) 08/15/2020   VD25OH 35.56 01/18/2020   Assessment/Plan:   Orders Placed This Encounter  Procedures   Basic Metabolic Panel (BMET)   VITAMIN D 25 Hydroxy (Vit-D Deficiency, Fractures)   Hemoglobin A1c    Medications Discontinued During This Encounter  Medication Reason   atorvastatin (LIPITOR) 20 MG tablet Error   Vitamin D, Ergocalciferol, (DRISDOL) 1.25 MG (50000 UNIT) CAPS capsule Reorder   buPROPion ER (WELLBUTRIN SR) 100 MG 12 hr tablet Non-compliance     Meds ordered this encounter  Medications   Vitamin D, Ergocalciferol, (DRISDOL) 1.25 MG (50000 UNIT) CAPS capsule    Sig: 1 po q 7 d    Dispense:  4 capsule    Refill:  0    30 d supply;  ** OV for RF **   Do not send RF request     1. Depressed mood with emotional eating Behavior modification techniques were discussed today to help [name] [name] deal with her emotional/non-hunger eating behaviors.  Orders and follow up as documented in patient record.  Continue walking 15 minutes or more per day. Continue counseling with pastor. Focus on self-care. Discontinue Wellbutrin.  2. Type 2 diabetes mellitus with other specified complication, without long-term current use of insulin (HCC) Good blood sugar control is important to decrease the likelihood of diabetic complications such as nephropathy, neuropathy, limb loss, blindness, coronary artery disease, and death. Intensive lifestyle modification including diet, exercise and weight loss are the first line of treatment for diabetes.  I scheduled for  labs with Dr. Lucianne Muss on November 1st; last A1c was in June. Pt advised she should obtain labs sooner than later and Dr. Lucianne Muss can see our lab results. Continue Rybelsus.  - Hemoglobin A1c  3. Vitamin D deficiency Low Vitamin D level contributes to fatigue and are associated with obesity, breast, and colon cancer. She agrees to continue to take prescription Vitamin  D 50,000 IU every week and will follow-up for routine testing of Vitamin D, at least 2-3 times per year to avoid over-replacement. Check labs at next OV.  Refill- Vitamin D, Ergocalciferol, (DRISDOL) 1.25 MG (50000 UNIT) CAPS capsule; 1 po q 7 d  Dispense: 4 capsule; Refill: 0  - Basic Metabolic Panel (BMET) - VITAMIN D 25 Hydroxy (Vit-D Deficiency, Fractures)  4. Obesity with current BMI of 32.42  [name] [name] is currently in the action stage of change. As such, her goal is to continue with weight loss efforts. She has agreed to the Category 1 Plan and keeping a food journal and adhering to recommended goals of 1000-1100 calories and 80 grams protein.   Exercise goals: Once feeling better- For substantial health benefits, adults should do at least 150 minutes (2 hours and 30 minutes) a week of moderate-intensity, or 75 minutes (1 hour and 15 minutes) a week of vigorous-intensity aerobic physical activity, or an equivalent combination of moderate- and vigorous-intensity aerobic activity. Aerobic activity should be performed in episodes of at least 10 minutes, and preferably, it should be spread throughout the week.  Behavioral modification strategies: keeping a strict food journal.  [name] [name] has agreed to follow-up with our clinic in 3 weeks. She was informed of the importance of frequent follow-up visits to maximize her success with intensive lifestyle modifications for her multiple health conditions.  Objective:   VITALS: Per patient if applicable, see vitals. GENERAL: Alert and in no acute distress. CARDIOPULMONARY: No increased WOB. Speaking in clear sentences.  PSYCH: Pleasant and cooperative. Speech normal rate and rhythm. Affect is appropriate. Insight and judgement are appropriate. Attention is focused, linear, and appropriate.  NEURO: Oriented as arrived to appointment on time with no prompting.   Lab Results  Component Value Date   CREATININE 0.70 04/08/2021   BUN 16 04/08/2021   NA 137  04/08/2021   K 4.4 04/08/2021   CL 104 04/08/2021   CO2 23 04/08/2021   Lab Results  Component Value Date   ALT 13 11/19/2020   AST 18 11/19/2020   ALKPHOS 77 11/19/2020   BILITOT 0.6 11/19/2020   Lab Results  Component Value Date   HGBA1C 7.0 (H) 04/08/2021   HGBA1C 6.8 (H) 11/19/2020   HGBA1C 7.4 (H) 08/15/2020   HGBA1C 7.3 (H) 04/23/2020   HGBA1C 8.5 (A) 01/25/2020   Lab Results  Component Value Date   INSULIN 9.3 11/19/2020   Lab Results  Component Value Date   TSH 3.020 11/19/2020   Lab Results  Component Value Date   CHOL 95 (L) 11/19/2020   HDL 38 (L) 11/19/2020   LDLCALC 35 11/19/2020   TRIG 120 11/19/2020   CHOLHDL 2.5 11/19/2020   Lab Results  Component Value Date   VD25OH 48.5 11/19/2020   VD25OH 29.29 (L) 08/15/2020   VD25OH 35.56 01/18/2020   Lab Results  Component Value Date   WBC 9.8 11/19/2020   HGB 13.1 11/19/2020   HCT 39.3 11/19/2020   MCV 79 11/19/2020   PLT 329 11/19/2020   No results found for: IRON, TIBC, FERRITIN  Attestation Statements:  Reviewed by clinician on day of visit: allergies, medications, problem list, medical history, surgical history, family history, social history, and previous encounter notes.  Edmund Hilda, CMA, am acting as transcriptionist for [employer], DO.  I have reviewed the above documentation for accuracy and completeness, and I agree with the above. Carlye Grippe, D.O.  The 21st Century Cures Act was signed into law in 2016 which includes the topic of electronic health records.  This provides immediate access to information in MyChart.  This includes consultation notes, operative notes, office notes, lab results and pathology reports.  If you have any questions about what you read please let us know at your next visit so we can discuss your concerns and take corrective action if need be.  We are right here with you.

## 2021-08-07 ENCOUNTER — Ambulatory Visit (INDEPENDENT_AMBULATORY_CARE_PROVIDER_SITE_OTHER): Payer: Managed Care, Other (non HMO) | Admitting: Family Medicine

## 2021-08-08 ENCOUNTER — Other Ambulatory Visit: Payer: Self-pay | Admitting: Endocrinology

## 2021-08-13 ENCOUNTER — Other Ambulatory Visit (INDEPENDENT_AMBULATORY_CARE_PROVIDER_SITE_OTHER): Payer: Self-pay | Admitting: Family Medicine

## 2021-08-13 DIAGNOSIS — E559 Vitamin D deficiency, unspecified: Secondary | ICD-10-CM

## 2021-08-13 NOTE — Telephone Encounter (Signed)
Dr.Opalski ?

## 2021-08-19 ENCOUNTER — Ambulatory Visit (INDEPENDENT_AMBULATORY_CARE_PROVIDER_SITE_OTHER): Payer: Managed Care, Other (non HMO) | Admitting: Family Medicine

## 2021-08-19 ENCOUNTER — Other Ambulatory Visit: Payer: Managed Care, Other (non HMO)

## 2021-08-20 ENCOUNTER — Encounter (INDEPENDENT_AMBULATORY_CARE_PROVIDER_SITE_OTHER): Payer: Self-pay | Admitting: Family Medicine

## 2021-08-20 ENCOUNTER — Ambulatory Visit (INDEPENDENT_AMBULATORY_CARE_PROVIDER_SITE_OTHER): Payer: Managed Care, Other (non HMO) | Admitting: Family Medicine

## 2021-08-20 ENCOUNTER — Other Ambulatory Visit: Payer: Self-pay

## 2021-08-20 VITALS — BP 121/82 | HR 75 | Temp 98.4°F | Ht 60.0 in | Wt 170.0 lb

## 2021-08-20 DIAGNOSIS — Z6833 Body mass index (BMI) 33.0-33.9, adult: Secondary | ICD-10-CM | POA: Diagnosis not present

## 2021-08-20 DIAGNOSIS — E1169 Type 2 diabetes mellitus with other specified complication: Secondary | ICD-10-CM | POA: Diagnosis not present

## 2021-08-20 DIAGNOSIS — Z9189 Other specified personal risk factors, not elsewhere classified: Secondary | ICD-10-CM | POA: Diagnosis not present

## 2021-08-20 DIAGNOSIS — E559 Vitamin D deficiency, unspecified: Secondary | ICD-10-CM | POA: Diagnosis not present

## 2021-08-20 DIAGNOSIS — E669 Obesity, unspecified: Secondary | ICD-10-CM | POA: Diagnosis not present

## 2021-08-20 MED ORDER — VITAMIN D (ERGOCALCIFEROL) 1.25 MG (50000 UNIT) PO CAPS: ORAL_CAPSULE | ORAL | 0 refills | Status: DC

## 2021-08-20 NOTE — Progress Notes (Signed)
Chief Complaint:   OBESITY [name] [name] is here to discuss her progress with her obesity treatment plan along with follow-up of her obesity related diagnoses. [name] [name] is on the Category 1 Plan or keeping a food journal and adhering to recommended goals of 1000-1100 calories and 80 grams of protein daily and states she is following her eating plan approximately 40% of the time. [name] [name] states she is doing 0 minutes 0 times per week.  Today's visit was #: 10 Starting weight: 172 lbs Starting date: 11/19/2020 Today's weight: 170 lbs Today's date: 08/20/2021 Total lbs lost to date: 2 Total lbs lost since last in-office visit: 0  Interim History: Wendie has a lot of stress at work "very very busy". She notes she has no time for herself. She notes she has been going out with the girls a lot and eating off the plan and drinking. She hasn't been going to the gym either due to no time to do so. She occasionally skips meals and she is snacking more as well.  Subjective:   1. Type 2 diabetes mellitus with other specified complication, without long-term current use of insulin (HCC) [name] [name] see's Dr. Lucianne Muss on the 15th for labs and on the 17th for an office visit. She lost her meter and so she is not checking her blood sugars, but she can feel when she needs to eat. She is asymptomatic otherwise.  2. Vitamin D deficiency [name] [name] is currently taking prescription vitamin D 50,000 IU each week. She denies nausea, vomiting or muscle weakness.  3. At risk for malnutrition [name] [name] is at increased risk for malnutrition due to current nutrition status.  Assessment/Plan:  No orders of the defined types were placed in this encounter.   Medications Discontinued During This Encounter  Medication Reason   Vitamin D, Ergocalciferol, (DRISDOL) 1.25 MG (50000 UNIT) CAPS capsule Reorder     Meds ordered this encounter  Medications   Vitamin D, Ergocalciferol, (DRISDOL) 1.25 MG (50000 UNIT) CAPS capsule    Sig: 1 po q 7 d     Dispense:  4 capsule    Refill:  0    30 d supply;  ** OV for RF **   Do not send RF request     1. Type 2 diabetes mellitus with other specified complication, without long-term current use of insulin (HCC) I recommended Zula to consider changing her dose to PM to enhance her weight loss efforts. She wishes to discuss with her Endocrinologist at her upcoming office visit. I recommended a blood sugar monitor and to call for a new once which she gets free through insurance. Good blood sugar control is important to decrease the likelihood of diabetic complications such as nephropathy, neuropathy, limb loss, blindness, coronary artery disease, and death. Intensive lifestyle modification including diet, exercise and weight loss are the first line of treatment for diabetes.   2. Vitamin D deficiency Low Vitamin D level contributes to fatigue and are associated with obesity, breast, and colon cancer. We will refill prescription Vitamin D 50,000 IU every week for 1 month. Blima will follow-up for routine testing of Vitamin D, at least 2-3 times per year to avoid over-replacement.  - Vitamin D, Ergocalciferol, (DRISDOL) 1.25 MG (50000 UNIT) CAPS capsule; 1 po q 7 d  Dispense: 4 capsule; Refill: 0  3. At risk for malnutrition [name] [name] was given approximately 9 minutes of counseling today regarding prevention of malnutrition and ways to meet macronutrient goals.   4. Obesity with current  BMI of 33.2 [name] [name] is currently in the action stage of change. As such, her goal is to continue with weight loss efforts. She has agreed to the Category 1 Plan or keeping a food journal and adhering to recommended goals of 1000-1100 calories and 80 grams of protein daily.   Exercise goals: For substantial health benefits, adults should do at least 150 minutes (2 hours and 30 minutes) a week of moderate-intensity, or 75 minutes (1 hour and 15 minutes) a week of vigorous-intensity aerobic physical activity, or an equivalent  combination of moderate- and vigorous-intensity aerobic activity. Aerobic activity should be performed in episodes of at least 10 minutes, and preferably, it should be spread throughout the week.  Behavioral modification strategies: increasing lean protein intake, decreasing simple carbohydrates, decreasing eating out, and planning for success.  [name] [name] has agreed to follow-up with our clinic in 3 weeks. She was informed of the importance of frequent follow-up visits to maximize her success with intensive lifestyle modifications for her multiple health conditions.   Objective:   Blood pressure 121/82, pulse 75, temperature 98.4 F (36.9 C), height 5' (1.524 m), weight 170 lb (77.1 kg), SpO2 99 %. Body mass index is 33.2 kg/m.  General: Cooperative, alert, well developed, in no acute distress. HEENT: Conjunctivae and lids unremarkable. Cardiovascular: Regular rhythm.  Lungs: Normal work of breathing. Neurologic: No focal deficits.   Lab Results  Component Value Date   CREATININE 0.70 04/08/2021   BUN 16 04/08/2021   NA 137 04/08/2021   K 4.4 04/08/2021   CL 104 04/08/2021   CO2 23 04/08/2021   Lab Results  Component Value Date   ALT 13 11/19/2020   AST 18 11/19/2020   ALKPHOS 77 11/19/2020   BILITOT 0.6 11/19/2020   Lab Results  Component Value Date   HGBA1C 7.0 (H) 04/08/2021   HGBA1C 6.8 (H) 11/19/2020   HGBA1C 7.4 (H) 08/15/2020   HGBA1C 7.3 (H) 04/23/2020   HGBA1C 8.5 (A) 01/25/2020   Lab Results  Component Value Date   INSULIN 9.3 11/19/2020   Lab Results  Component Value Date   TSH 3.020 11/19/2020   Lab Results  Component Value Date   CHOL 95 (L) 11/19/2020   HDL 38 (L) 11/19/2020   LDLCALC 35 11/19/2020   TRIG 120 11/19/2020   CHOLHDL 2.5 11/19/2020   Lab Results  Component Value Date   VD25OH 48.5 11/19/2020   VD25OH 29.29 (L) 08/15/2020   VD25OH 35.56 01/18/2020   Lab Results  Component Value Date   WBC 9.8 11/19/2020   HGB 13.1 11/19/2020    HCT 39.3 11/19/2020   MCV 79 11/19/2020   PLT 329 11/19/2020   No results found for: IRON, TIBC, FERRITIN  Attestation Statements:   Reviewed by clinician on day of visit: allergies, medications, problem list, medical history, surgical history, family history, social history, and previous encounter notes.   Trude Mcburney, am acting as transcriptionist for [employer], DO.  I have reviewed the above documentation for accuracy and completeness, and I agree with the above. Carlye Grippe, D.O.  The 21st Century Cures Act was signed into law in 2016 which includes the topic of electronic health records.  This provides immediate access to information in MyChart.  This includes consultation notes, operative notes, office notes, lab results and pathology reports.  If you have any questions about what you read please let us know at your next visit so we can discuss your concerns and take  corrective action if need be.  We are right here with you.

## 2021-08-21 ENCOUNTER — Ambulatory Visit: Payer: Managed Care, Other (non HMO) | Admitting: Endocrinology

## 2021-09-02 ENCOUNTER — Other Ambulatory Visit (INDEPENDENT_AMBULATORY_CARE_PROVIDER_SITE_OTHER): Payer: Managed Care, Other (non HMO)

## 2021-09-02 ENCOUNTER — Other Ambulatory Visit: Payer: Self-pay

## 2021-09-02 ENCOUNTER — Other Ambulatory Visit: Payer: Self-pay | Admitting: Endocrinology

## 2021-09-02 DIAGNOSIS — E559 Vitamin D deficiency, unspecified: Secondary | ICD-10-CM

## 2021-09-02 DIAGNOSIS — E1165 Type 2 diabetes mellitus with hyperglycemia: Secondary | ICD-10-CM

## 2021-09-02 LAB — BASIC METABOLIC PANEL
BUN: 14 mg/dL (ref 6–23)
CO2: 27 mEq/L (ref 19–32)
Calcium: 10.8 mg/dL — ABNORMAL HIGH (ref 8.4–10.5)
Chloride: 104 mEq/L (ref 96–112)
Creatinine, Ser: 0.71 mg/dL (ref 0.40–1.20)
GFR: 95.4 mL/min (ref >60.00)
Glucose, Bld: 109 mg/dL — ABNORMAL HIGH (ref 70–99)
Potassium: 4.1 mEq/L (ref 3.5–5.1)
Sodium: 138 mEq/L (ref 135–145)

## 2021-09-02 LAB — MICROALBUMIN / CREATININE URINE RATIO
Creatinine,U: 130.2 mg/dL
Microalb Creat Ratio: 1.4 mg/g (ref 0.0–30.0)
Microalb, Ur: 1.8 mg/dL (ref 0.0–1.9)

## 2021-09-02 LAB — HEMOGLOBIN A1C: Hgb A1c MFr Bld: 7.2 % — ABNORMAL HIGH (ref 4.6–6.5)

## 2021-09-02 LAB — VITAMIN D 25 HYDROXY (VIT D DEFICIENCY, FRACTURES): VITD: 41.83 ng/mL (ref 30.00–100.00)

## 2021-09-04 ENCOUNTER — Ambulatory Visit: Payer: Managed Care, Other (non HMO) | Admitting: Endocrinology

## 2021-09-04 ENCOUNTER — Encounter: Payer: Self-pay | Admitting: Endocrinology

## 2021-09-04 ENCOUNTER — Other Ambulatory Visit: Payer: Self-pay

## 2021-09-04 VITALS — BP 122/78 | HR 89 | Ht 60.0 in | Wt 172.0 lb

## 2021-09-04 DIAGNOSIS — E1165 Type 2 diabetes mellitus with hyperglycemia: Secondary | ICD-10-CM

## 2021-09-04 DIAGNOSIS — E213 Hyperparathyroidism, unspecified: Secondary | ICD-10-CM

## 2021-09-04 NOTE — Patient Instructions (Addendum)
Check blood sugars on waking up 3 days a week  Also check blood sugars about 2 hours after meals and do this after different meals by rotation  Recommended blood sugar levels on waking up are 90-130 and about 2 hours after meal is 130-160  Please bring your blood sugar monitor to each visit, thank you  Take 14mg  Rybelsus  Let know when out  5mg  Mounjaro for 4  shots then 7.5 weekly  Metformin 3 daily

## 2021-09-04 NOTE — Progress Notes (Signed)
Patient ID: [name] [name], female   DOB: 10/13/1966, 55 y.o.   MRN: [id number]              Chief complaint: Endocrinology follow-up   History of Present Illness:  Problem 1: DIABETES type II  She was initially diagnosed in 2017  Current management, blood sugar readings, problems identified:  Medication regimen: Metformin ER 2000 mg twice daily, Rybelsus 7 mg in a.m.  A1c is 7.2 and gradually increasing   She has only a few blood sugars at home which are not high  However she thinks her blood sugars may be higher from stress eating, lack of exercise  She has not missed any doses of Rybelsus However her PCP has given her 500 mg metformin prescription instead of the 750 and she is not able to tolerate 4 tablets a day as prescribed because of diarrhea  Previously was going to the gym for exercise but now has more time for exercise because of her long working days  Also may not always make the best choices when eating in a hurry  She is asking about Mounjaro   Last consultation with dietitian: 7/21  Blood sugar data from the  faxed information from her [employer] meter   PRE-MEAL Fasting Lunch Dinner Bedtime Overall  Glucose range: ?    95-130  Mean/median:     115   POST-MEAL PC Breakfast PC Lunch PC Dinner  Glucose range: 129 117 130  Mean/median:       Previously:  PRE-MEAL Morning Lunch Dinner Bedtime Overall  Glucose range: 108-137  118    Mean/median:     125      Wt Readings from Last 3 Encounters:  09/04/21 172 lb (78 kg)  08/20/21 170 lb (77.1 kg)  06/30/21 166 lb (75.3 kg)    Lab Results  Component Value Date   HGBA1C 7.2 (H) 09/02/2021   HGBA1C 7.0 (H) 04/08/2021   HGBA1C 6.8 (H) 11/19/2020   Lab Results  Component Value Date   MICROALBUR 1.8 09/02/2021   LDLCALC 35 11/19/2020   CREATININE 0.71 09/02/2021    HYPERCALCEMIA:  Review of records show that she had a high calcium of 11.2 done in October 2017 The patient thinks that she has  had higher calcium before also but no records are available Apparently at some point she was taking HCTZ and this was stopped with some improvement in calcium  PTH level from unknown lab 29 done in 10/17 and subsequently 32 in 8/19  RECENT history: Her serum calcium has been generally high and ranging between 10.5 -11.4 since 12/18 More recently levels have been below 11  Has been asymptomatic as before with no history of fractures or kidney stones No osteoporosis on previous bone density from 09/2017  Lab Results  Component Value Date   CALCIUM 10.8 (H) 09/02/2021   CALCIUM 10.9 (H) 04/08/2021   CALCIUM 10.8 (H) 11/19/2020   CALCIUM 11.4 (H) 08/15/2020   CALCIUM 11.0 (H) 04/23/2020   CALCIUM 10.7 (H) 01/18/2020   CALCIUM 11.2 (H) 07/24/2019   CALCIUM 10.5 03/03/2019   CALCIUM 11.1 (H) 11/22/2018   Lab Results  Component Value Date   PTH 32 05/23/2018   CALCIUM 10.8 (H) 09/02/2021   CAION 1.31 07/31/2017   PHOS 3.2 10/01/2017       VITAMIN D deficiency:  She has been on vitamin D supplements for persistently low levels below 20 since at least 2018  She is taking taking vitamin D3,  5000 units  Also in addition to her OTC supplement she is taking prescription 50,000 units weekly This was started on her visit in 5/20 and her level at that time was about 20  Vitamin D level is recently back to normal  Lab Results  Component Value Date   VD25OH 41.83 09/02/2021   VD25OH 48.5 11/19/2020   VD25OH 29.29 (L) 08/15/2020   VD25OH 35.56 01/18/2020   VD25OH 36.42 07/24/2019        Allergies as of 09/04/2021       Reactions   Penicillins Hives   Has patient had a PCN reaction causing immediate rash, facial/tongue/throat swelling, SOB or lightheadedness with hypotension: unkn Has patient had a PCN reaction causing severe rash involving mucus membranes or skin necrosis: unkn Has patient had a PCN reaction that required hospitalization: no Has patient had a PCN reaction  occurring within the last 10 years: no If all of the above answers are "NO", then may proceed with Cephalosporin use.        Medication List        Accurate as of September 04, 2021  2:12 PM. If you have any questions, ask your nurse or doctor.          amLODipine 10 MG tablet Commonly known as: NORVASC Take 1 tablet (10 mg total) by mouth daily.   aspirin 81 MG EC tablet Take 1 tablet (81 mg total) by mouth daily.   metFORMIN 500 MG 24 hr tablet Commonly known as: GLUCOPHAGE-XR Take 1,000 mg by mouth 2 (two) times daily.   metoprolol tartrate 100 MG tablet Commonly known as: LOPRESSOR TAKE 1 TABLET BY MOUTH TWICE A DAY   Repatha SureClick 140 MG/ML Soaj Generic drug: Evolocumab Inject 140 mg into the skin every 14 (fourteen) days.   Rybelsus 7 MG Tabs Generic drug: Semaglutide TAKE 1 TABLET DAILY 30 MINUTES BEFORE BREAKFAST WITH WATER   valsartan 160 MG tablet Commonly known as: DIOVAN TAKE 1 TABLET BY MOUTH EVERYDAY AT BEDTIME   Vitamin D (Ergocalciferol) 1.25 MG (50000 UNIT) Caps capsule Commonly known as: DRISDOL 1 po q 7 d        Allergies:  Allergies  Allergen Reactions   Penicillins Hives    Has patient had a PCN reaction causing immediate rash, facial/tongue/throat swelling, SOB or lightheadedness with hypotension: unkn Has patient had a PCN reaction causing severe rash involving mucus membranes or skin necrosis: unkn Has patient had a PCN reaction that required hospitalization: no Has patient had a PCN reaction occurring within the last 10 years: no If all of the above answers are "NO", then may proceed with Cephalosporin use.     Past Medical History:  Diagnosis Date   Anxiety    Constipation    Coronary artery disease    Diabetes mellitus without complication (HCC)    Heart disease    History of heart attack    Hyperlipidemia    Hypertension    Obesity    SOBOE (shortness of breath on exertion)    Swelling of both lower extremities     Trouble in sleeping    Vitamin D deficiency     Past Surgical History:  Procedure Laterality Date   CARDIAC CATHETERIZATION     CESAREAN SECTION  12/13/1998   CORONARY ARTERY BYPASS GRAFT N/A 07/30/2017   Procedure: CORONARY ARTERY BYPASS GRAFTING (CABG)  x four, using right internal mammary artery, right greater saphenous vein, left radial artery;  Surgeon: Loreli Slot, MD;  Location: [facility] OR;  Service: Open Heart Surgery;  Laterality: N/A;   INTRAOPERATIVE TRANSESOPHAGEAL ECHOCARDIOGRAM N/A 07/30/2017   Procedure: INTRAOPERATIVE TRANSESOPHAGEAL ECHOCARDIOGRAM;  Surgeon: Loreli Slot, MD;  Location: [facility] OR;  Service: Open Heart Surgery;  Laterality: N/A;   LEFT HEART CATH AND CORONARY ANGIOGRAPHY N/A 07/29/2017   Procedure: LEFT HEART CATH AND CORONARY ANGIOGRAPHY;  Surgeon: Swaziland, Peter M, MD;  Location: [facility] INVASIVE CV LAB;  Service: Cardiovascular;  Laterality: N/A;   LEFT HEART CATH AND CORS/GRAFTS ANGIOGRAPHY N/A 07/25/2018   Procedure: LEFT HEART CATH AND CORS/GRAFTS ANGIOGRAPHY;  Surgeon: Runell Gess, MD;  Location: [facility] INVASIVE CV LAB;  Service: Cardiovascular;  Laterality: N/A;   MYOMECTOMY  09/1994   PARTIAL HYSTERECTOMY  2005   RADIAL ARTERY HARVEST Left 07/30/2017   Procedure: RADIAL ARTERY HARVEST;  Surgeon: Loreli Slot, MD;  Location: [facility] OR;  Service: Open Heart Surgery;  Laterality: Left;    Family History  Problem Relation Age of Onset   Hypertension Other    Hypertension Mother    Hyperlipidemia Mother    Obesity Mother    Diabetes Father    Hypertension Father    Hyperlipidemia Father     Social History:  reports that she has never smoked. She has never used smokeless tobacco. She reports current alcohol use. She reports that she does not use drugs.  Review of Systems  HYPERCHOLESTEROLEMIA: Followed by PCP/cardiologist   She does have a history of CAD and now she is on Repatha also along with reduced dose of Lipitor 20  mg. She does not like needles but is getting used to the injection now   Lab Results  Component Value Date   CHOL 95 (L) 11/19/2020   CHOL 161 02/21/2020   CHOL 156 11/23/2017   Lab Results  Component Value Date   HDL 38 (L) 11/19/2020   HDL 36 (L) 02/21/2020   HDL 36 (L) 11/23/2017   Lab Results  Component Value Date   LDLCALC 35 11/19/2020   LDLCALC 104 (H) 02/21/2020   LDLCALC 92 11/23/2017   Lab Results  Component Value Date   TRIG 120 11/19/2020   TRIG 113 02/21/2020   TRIG 140 11/23/2017   Lab Results  Component Value Date   CHOLHDL 2.5 11/19/2020   CHOLHDL 4.5 (H) 02/21/2020   CHOLHDL 4.3 11/23/2017   No results found for: LDLDIRECT    EXAM:  BP 122/78   Pulse 89   Ht 5' (1.524 m)   Wt 172 lb (78 kg)   SpO2 98%   BMI 33.59 kg/m    Assessment/Plan:   DIABETES type 2, non-insulin-dependent:  Her A1c is 7.2 and higher  She is having fair control on a regimen of Rybelsus 7 mg in the morning and Metformin ER up to 2 g daily  Her blood sugars are not looking higher at home but likely has not checked enough readings including after meals and may be also having some higher fasting readings Not able to tolerate full dose of metformin Also because of her work schedule, stress and lack of exercise blood sugars may be higher  Has gained back some weight  She is asking about Mounjaro because of the TV advertising  Recommendations:  Increase Rybelsus to 14 mg, she has a 60-day supply apparently 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 She will try to cut back on snacks or fast foods and any high calorie meals More frequent  monitoring of her blood sugars at various times, discussed blood sugar targets She can take 1 metformin in the morning and 2 in the evening at dinnertime with the 500 mg, may need to reduce the dose if still not tolerated Before she finishes her Rybelsus she will let us know about switching  to [facility] At that time she can start with 5 mg weekly  Discussed with the patient the action of GIP/GLP-1 drugs, the effects on pancreatic and liver function, effects on brain and stomach with improved satiety, slowing gastric emptying, improving satiety and reducing liver glucose output.  Discussed the effects on promoting weight loss. Explained possible side effects of MOUNJARO, most commonly nausea that usually improves over time; discussed safety information in package insert.  Demonstrated the medication injection device and injection technique to the patient.  Showed patient the injection sites for his medication To start with 5.0 mg dosage weekly for the first 4 injections and then increase the dose to 7.5 mg weekly Patient brochure on Mounjaro and explained use of the available co-pay card  Follow-up in 3 months  HYPERCALCEMIA: Again stable below 11, likely still mild hyperparathyroidism Will need bone density next year       Reather Littler 09/04/2021, 2:12 PM     Note: This office note was prepared with Dragon voice recognition system technology. Any transcriptional errors that result from this process are unintentional.

## 2021-09-09 ENCOUNTER — Ambulatory Visit (INDEPENDENT_AMBULATORY_CARE_PROVIDER_SITE_OTHER): Payer: Managed Care, Other (non HMO) | Admitting: Family Medicine

## 2021-10-09 ENCOUNTER — Telehealth: Payer: Self-pay | Admitting: Endocrinology

## 2021-10-09 ENCOUNTER — Other Ambulatory Visit: Payer: Self-pay

## 2021-10-09 DIAGNOSIS — E1165 Type 2 diabetes mellitus with hyperglycemia: Secondary | ICD-10-CM

## 2021-10-09 MED ORDER — TIRZEPATIDE 5 MG/0.5ML ~~LOC~~ SOAJ: 5.0000 mg | SUBCUTANEOUS | 2 refills | Status: DC

## 2021-10-09 NOTE — Telephone Encounter (Signed)
Patient called to request new RX for [facility] be sent to:  [id number] [place], [place] - [address] AT [facility] [address] Phone:  [phone]  Fax:  [phone]    Per Patient Dr. Lucianne Muss is taking patient off Rybelus and replacing with [facility]

## 2021-10-10 NOTE — Telephone Encounter (Signed)
Script was sent yesterday

## 2021-10-23 ENCOUNTER — Other Ambulatory Visit (INDEPENDENT_AMBULATORY_CARE_PROVIDER_SITE_OTHER): Payer: Self-pay | Admitting: Family Medicine

## 2021-10-23 DIAGNOSIS — E559 Vitamin D deficiency, unspecified: Secondary | ICD-10-CM

## 2021-10-27 NOTE — Telephone Encounter (Signed)
LOV w/ Dr. O

## 2021-11-05 ENCOUNTER — Other Ambulatory Visit: Payer: Self-pay | Admitting: Gynecology

## 2021-11-05 DIAGNOSIS — Z1231 Encounter for screening mammogram for malignant neoplasm of breast: Secondary | ICD-10-CM

## 2021-11-25 ENCOUNTER — Ambulatory Visit
Admission: RE | Admit: 2021-11-25 | Discharge: 2021-11-25 | Disposition: A | Discharge status: Home / Self Care | Payer: 59 | Source: Ambulatory Visit | Attending: Gynecology | Admitting: Gynecology

## 2021-11-25 DIAGNOSIS — Z1231 Encounter for screening mammogram for malignant neoplasm of breast: Secondary | ICD-10-CM

## 2021-11-26 ENCOUNTER — Other Ambulatory Visit: Payer: Self-pay

## 2021-12-04 ENCOUNTER — Ambulatory Visit: Payer: Self-pay | Admitting: Endocrinology

## 2021-12-07 ENCOUNTER — Other Ambulatory Visit (INDEPENDENT_AMBULATORY_CARE_PROVIDER_SITE_OTHER): Payer: Self-pay | Admitting: Family Medicine

## 2021-12-07 DIAGNOSIS — E559 Vitamin D deficiency, unspecified: Secondary | ICD-10-CM

## 2021-12-08 ENCOUNTER — Other Ambulatory Visit: Payer: Self-pay

## 2021-12-16 ENCOUNTER — Other Ambulatory Visit (INDEPENDENT_AMBULATORY_CARE_PROVIDER_SITE_OTHER): Payer: 59

## 2021-12-16 ENCOUNTER — Other Ambulatory Visit: Payer: Self-pay

## 2021-12-16 DIAGNOSIS — E1165 Type 2 diabetes mellitus with hyperglycemia: Secondary | ICD-10-CM

## 2021-12-16 LAB — HEMOGLOBIN A1C: Hgb A1c MFr Bld: 6.9 % — ABNORMAL HIGH (ref 4.6–6.5)

## 2021-12-16 LAB — COMPREHENSIVE METABOLIC PANEL
ALT: 15 U/L (ref 0–35)
AST: 16 U/L (ref 0–37)
Albumin: 4.5 g/dL (ref 3.5–5.2)
Alkaline Phosphatase: 63 U/L (ref 39–117)
BUN: 16 mg/dL (ref 6–23)
CO2: 25 mEq/L (ref 19–32)
Calcium: 11.3 mg/dL — ABNORMAL HIGH (ref 8.4–10.5)
Chloride: 104 mEq/L (ref 96–112)
Creatinine, Ser: 0.71 mg/dL (ref 0.40–1.20)
GFR: 95.21 mL/min (ref >60.00)
Glucose, Bld: 93 mg/dL (ref 70–99)
Potassium: 4.2 mEq/L (ref 3.5–5.1)
Sodium: 137 mEq/L (ref 135–145)
Total Bilirubin: 0.5 mg/dL (ref 0.2–1.2)
Total Protein: 7.9 g/dL (ref 6.0–8.3)

## 2021-12-23 ENCOUNTER — Other Ambulatory Visit: Payer: Self-pay

## 2021-12-23 ENCOUNTER — Ambulatory Visit: Payer: 59 | Admitting: Endocrinology

## 2021-12-23 ENCOUNTER — Encounter: Payer: Self-pay | Admitting: Endocrinology

## 2021-12-23 VITALS — BP 140/98 | HR 64 | Ht 60.0 in | Wt 172.4 lb

## 2021-12-23 DIAGNOSIS — E559 Vitamin D deficiency, unspecified: Secondary | ICD-10-CM

## 2021-12-23 DIAGNOSIS — E213 Hyperparathyroidism, unspecified: Secondary | ICD-10-CM | POA: Diagnosis not present

## 2021-12-23 DIAGNOSIS — E1165 Type 2 diabetes mellitus with hyperglycemia: Secondary | ICD-10-CM | POA: Diagnosis not present

## 2021-12-23 NOTE — Patient Instructions (Addendum)
Check blood sugars on waking up 2-3 days a week ? ?Also check blood sugars about 2 hours after meals and do this after different meals by rotation ? ?Recommended blood sugar levels on waking up are 90-130 and about 2 hours after meal is 130-160 ? ?Please bring your blood sugar monitor to each visit, thank you ? ?Walk daily ? ?5000 U of D3 daily ?

## 2021-12-23 NOTE — Progress Notes (Signed)
Patient ID: [name] [name], female   DOB: 1965-12-01, 56 y.o.   MRN: [id number]              Chief complaint: Endocrinology follow-up   History of Present Illness:  Problem 1: DIABETES type II  She was initially diagnosed in 2017   Medication regimen: Metformin ER 1500 mg daily, Mounjaro 5 mg weekly  Current management, level of control, blood sugar readings, problems identified:  A1c is 6.9 compared to 7.2    She was interested in weight loss and asking about Mounjaro on the last visit and was switched from Rybelsus to [facility]  Although she has switched to the 5 mg dose from the 14 mg of Rybelsus just before the change she has not had any obvious improvement in her weight or home glucose readings  However her A1c is slightly better  She is having bloating and some nausea from [facility] but not consistently  Also because of stress with family issues she is not planning her meals well and likely making poor choices Also not finding time to exercise  Has taken [facility] regularly but did not ask for the next higher dose to be prescribed as instructed  She was told to reduce her metformin to 3 tablets on the last visit because of diarrhea with 2000 mg She is generally not checking readings in the mornings and has a few readings in the 150s after breakfast or lunch   Last consultation with dietitian: 7/21  Blood sugar data for the last 30 days from the  faxed information from her [employer] meter  PREMEAL average 124, range 96-127 POSTMEAL average 138, range 125-158  PRE-MEAL Fasting Lunch Dinner Bedtime Overall  Glucose range: ?    96-158  Mean/median:     134   Previously:  PRE-MEAL Fasting Lunch Dinner Bedtime Overall  Glucose range: ?    95-130  Mean/median:     115   POST-MEAL PC Breakfast PC Lunch PC Dinner  Glucose range: 129 117 130  Mean/median:        Wt Readings from Last 3 Encounters:  12/23/21 172 lb 6.4 oz (78.2 kg)  09/04/21 172 lb (78 kg)  08/20/21  170 lb (77.1 kg)    Lab Results  Component Value Date   HGBA1C 6.9 (H) 12/16/2021   HGBA1C 7.2 (H) 09/02/2021   HGBA1C 7.0 (H) 04/08/2021   Lab Results  Component Value Date   MICROALBUR 1.8 09/02/2021   LDLCALC 35 11/19/2020   CREATININE 0.71 12/16/2021    HYPERCALCEMIA:  Review of records show that she had a high calcium of 11.2 done in October 2017 The patient thinks that she has had higher calcium before also but no records are available Apparently at some point she was taking HCTZ and this was stopped with some improvement in calcium  PTH level from unknown lab 29 done in 10/17 and subsequently 32 in 8/19  RECENT history: Her serum calcium has been generally high and ranging between 10.5 -11.4 since 12/18 It is now 11.3  Has been asymptomatic as before with no history of fractures or kidney stones No osteoporosis on previous bone density from 09/2017  Lab Results  Component Value Date   CALCIUM 11.3 (H) 12/16/2021   CALCIUM 10.8 (H) 09/02/2021   CALCIUM 10.9 (H) 04/08/2021   CALCIUM 10.8 (H) 11/19/2020   CALCIUM 11.4 (H) 08/15/2020   CALCIUM 11.0 (H) 04/23/2020   CALCIUM 10.7 (H) 01/18/2020   CALCIUM 11.2 (H) 07/24/2019  CALCIUM 10.5 03/03/2019   Lab Results  Component Value Date   PTH 32 05/23/2018   CALCIUM 11.3 (H) 12/16/2021   CAION 1.31 07/31/2017   PHOS 3.2 10/01/2017       VITAMIN D deficiency:  She has been on vitamin D supplements for persistently low levels below 20 since at least 2018  She is taking vitamin D3, 5000 units  Also in addition to her OTC supplement she is taking prescription 50,000 units weekly This was started on her visit in 5/20 and her level at that time was about 20  Vitamin D level is back to normal  Lab Results  Component Value Date   VD25OH 41.83 09/02/2021   VD25OH 48.5 11/19/2020   VD25OH 29.29 (L) 08/15/2020   VD25OH 35.56 01/18/2020   VD25OH 36.42 07/24/2019        Allergies as of 12/23/2021        Reactions   Penicillins Hives   Has patient had a PCN reaction causing immediate rash, facial/tongue/throat swelling, SOB or lightheadedness with hypotension: unkn Has patient had a PCN reaction causing severe rash involving mucus membranes or skin necrosis: unkn Has patient had a PCN reaction that required hospitalization: no Has patient had a PCN reaction occurring within the last 10 years: no If all of the above answers are "NO", then may proceed with Cephalosporin use.        Medication List        Accurate as of December 23, 2021 10:29 AM. If you have any questions, ask your nurse or doctor.          amLODipine 10 MG tablet Commonly known as: NORVASC Take 1 tablet (10 mg total) by mouth daily.   aspirin 81 MG EC tablet Take 1 tablet (81 mg total) by mouth daily.   metFORMIN 500 MG 24 hr tablet Commonly known as: GLUCOPHAGE-XR Take 1,000 mg by mouth 2 (two) times daily.   metoprolol tartrate 100 MG tablet Commonly known as: LOPRESSOR TAKE 1 TABLET BY MOUTH TWICE A DAY   Repatha SureClick [id number] MG/ML Soaj Generic drug: Evolocumab Inject 140 mg into the skin every 14 (fourteen) days.   Rybelsus 7 MG Tabs Generic drug: Semaglutide TAKE 1 TABLET DAILY 30 MINUTES BEFORE BREAKFAST WITH WATER   tirzepatide 5 MG/0.5ML Pen Commonly known as: MOUNJARO Inject 5 mg into the skin once a week.   valsartan 160 MG tablet Commonly known as: DIOVAN TAKE 1 TABLET BY MOUTH EVERYDAY AT BEDTIME   Vitamin D (Ergocalciferol) 1.25 MG (50000 UNIT) Caps capsule Commonly known as: DRISDOL 1 po q 7 d        Allergies:  Allergies  Allergen Reactions   Penicillins Hives    Has patient had a PCN reaction causing immediate rash, facial/tongue/throat swelling, SOB or lightheadedness with hypotension: unkn Has patient had a PCN reaction causing severe rash involving mucus membranes or skin necrosis: unkn Has patient had a PCN reaction that required hospitalization: no Has patient had a  PCN reaction occurring within the last 10 years: no If all of the above answers are "NO", then may proceed with Cephalosporin use.     Past Medical History:  Diagnosis Date   Anxiety    Constipation    Coronary artery disease    Diabetes mellitus without complication ([place])    Heart disease    History of heart attack    Hyperlipidemia    Hypertension    Obesity    SOBOE (shortness of breath  on exertion)    Swelling of both lower extremities    Trouble in sleeping    Vitamin D deficiency     Past Surgical History:  Procedure Laterality Date   CARDIAC CATHETERIZATION     CESAREAN SECTION  12/13/1998   CORONARY ARTERY BYPASS GRAFT N/A 07/30/2017   Procedure: CORONARY ARTERY BYPASS GRAFTING (CABG)  x four, using right internal mammary artery, right greater saphenous vein, left radial artery;  Surgeon: Melrose Nakayama, MD;  Location: [place];  Service: Open Heart Surgery;  Laterality: N/A;   INTRAOPERATIVE TRANSESOPHAGEAL ECHOCARDIOGRAM N/A 07/30/2017   Procedure: INTRAOPERATIVE TRANSESOPHAGEAL ECHOCARDIOGRAM;  Surgeon: Melrose Nakayama, MD;  Location: [place];  Service: Open Heart Surgery;  Laterality: N/A;   LEFT HEART CATH AND CORONARY ANGIOGRAPHY N/A 07/29/2017   Procedure: LEFT HEART CATH AND CORONARY ANGIOGRAPHY;  Surgeon: Martinique, Peter M, MD;  Location: [place] CV LAB;  Service: Cardiovascular;  Laterality: N/A;   LEFT HEART CATH AND CORS/GRAFTS ANGIOGRAPHY N/A 07/25/2018   Procedure: LEFT HEART CATH AND CORS/GRAFTS ANGIOGRAPHY;  Surgeon: Lorretta Harp, MD;  Location: [place] CV LAB;  Service: Cardiovascular;  Laterality: N/A;   MYOMECTOMY  09/1994   PARTIAL HYSTERECTOMY  2005   RADIAL ARTERY HARVEST Left 07/30/2017   Procedure: RADIAL ARTERY HARVEST;  Surgeon: Melrose Nakayama, MD;  Location: [place];  Service: Open Heart Surgery;  Laterality: Left;    Family History  Problem Relation Age of Onset   Hypertension Other    Hypertension Mother     Hyperlipidemia Mother    Obesity Mother    Diabetes Father    Hypertension Father    Hyperlipidemia Father     Social History:  reports that she has never smoked. She has never used smokeless tobacco. She reports current alcohol use. She reports that she does not use drugs.  Review of Systems  HYPERCHOLESTEROLEMIA: Followed by PCP/cardiologist   She does have a history of CAD and now she is on Repatha also along with reduced dose of Lipitor 20 mg.   Lab Results  Component Value Date   CHOL 95 (L) 11/19/2020   CHOL 161 02/21/2020   CHOL 156 11/23/2017   Lab Results  Component Value Date   HDL 38 (L) 11/19/2020   HDL 36 (L) 02/21/2020   HDL 36 (L) 11/23/2017   Lab Results  Component Value Date   LDLCALC 35 11/19/2020   LDLCALC 104 (H) 02/21/2020   LDLCALC 92 11/23/2017   Lab Results  Component Value Date   TRIG 120 11/19/2020   TRIG 113 02/21/2020   TRIG 140 11/23/2017   Lab Results  Component Value Date   CHOLHDL 2.5 11/19/2020   CHOLHDL 4.5 (H) 02/21/2020   CHOLHDL 4.3 11/23/2017   No results found for: LDLDIRECT   HYPERTENSION: This is being followed by her PCP, currently appears to be on 3 drugs including valsartan Blood pressure is higher which she thinks is from stress  BP Readings from Last 3 Encounters:  12/23/21 (!) 140/98  09/04/21 122/78  08/20/21 121/82      EXAM:  BP (!) 140/98    Pulse 64    Ht 5' (1.524 m)    Wt 172 lb 6.4 oz (78.2 kg)    SpO2 95%    BMI 33.67 kg/m    Assessment/Plan:   DIABETES type 2, non-insulin-dependent:  Her A1c is slightly better at 6.9  She is currently taking Mounjaro 5 mg weekly and Metformin ER up  to 2 g daily  Her blood sugars are generally fairly good with recent average about 134  She does appear to have some GI intolerance to [facility] but she thinks she can tolerate this  Again she thinks her blood sugars may be not as high because of lack of exercise and inconsistent diet Also not able to  tolerate full dose of metformin Her weight has leveled off  Recommendations:  Start regular exercise Check blood sugars by rotation at different times including some fasting May continue the same dose of Mounjaro 5 mg, she prefers this to taking over Rybelsus which was possibly not controlling her sugars as well However may consider 7.5 mg in the next visit if blood sugar control is not consistent and she has no GI symptoms She needs to start planning her meals better and will avoid fast food No change in metformin  HYPERCALCEMIA:  Has asymptomatic hyperparathyroidism Calcium now 11.3 Again no indications to refer her for parathyroid surgery as yet  History of vitamin D deficiency: We will recheck her levels on the next visit but advised her to restart taking OTC 5000 units vitamin D3 daily  Will need bone density at the end of the year  Hypertension: Advised her to follow-up with her PCP  Elayne Snare 12/23/2021, 10:29 AM     Note: This office note was prepared with Dragon voice recognition system technology. Any transcriptional errors that result from this process are unintentional.

## 2022-01-05 ENCOUNTER — Other Ambulatory Visit (INDEPENDENT_AMBULATORY_CARE_PROVIDER_SITE_OTHER): Payer: Self-pay | Admitting: Family Medicine

## 2022-01-05 ENCOUNTER — Other Ambulatory Visit: Payer: Self-pay | Admitting: Endocrinology

## 2022-01-05 DIAGNOSIS — E559 Vitamin D deficiency, unspecified: Secondary | ICD-10-CM

## 2022-01-05 DIAGNOSIS — E1165 Type 2 diabetes mellitus with hyperglycemia: Secondary | ICD-10-CM

## 2022-01-05 NOTE — Telephone Encounter (Signed)
Dr.Opalski ?

## 2022-01-21 ENCOUNTER — Other Ambulatory Visit: Payer: Self-pay

## 2022-01-21 DIAGNOSIS — E1165 Type 2 diabetes mellitus with hyperglycemia: Secondary | ICD-10-CM

## 2022-01-21 MED ORDER — MOUNJARO 5 MG/0.5ML ~~LOC~~ SOAJ: SUBCUTANEOUS | 2 refills | Status: DC

## 2022-01-22 ENCOUNTER — Telehealth: Payer: Self-pay | Admitting: Pharmacy Technician

## 2022-01-22 ENCOUNTER — Other Ambulatory Visit (HOSPITAL_COMMUNITY): Payer: Self-pay

## 2022-01-22 NOTE — Telephone Encounter (Signed)
Patient Advocate Encounter ? ?Received notification from [employer] regarding a prior authorization for [facility] 5MG . Authorization has been APPROVED from 3.6.23 to 4.4.24.  ? ? ? ?Authorization # [id number] ? ? ? ?Giovanne Nickolson R Ardeth Repetto, CPhT ?Patient Advocate ?[facility] ?Phone: [phone] ?Fax:  [phone] ? ?

## 2022-02-03 ENCOUNTER — Ambulatory Visit: Payer: 59 | Admitting: Cardiovascular Disease

## 2022-02-03 ENCOUNTER — Encounter: Payer: Self-pay | Admitting: Cardiovascular Disease

## 2022-02-03 VITALS — BP 132/76 | HR 66 | Ht 60.0 in | Wt 172.0 lb

## 2022-02-03 DIAGNOSIS — E785 Hyperlipidemia, unspecified: Secondary | ICD-10-CM

## 2022-02-03 DIAGNOSIS — I10 Essential (primary) hypertension: Secondary | ICD-10-CM | POA: Diagnosis not present

## 2022-02-03 DIAGNOSIS — E669 Obesity, unspecified: Secondary | ICD-10-CM

## 2022-02-03 DIAGNOSIS — Z951 Presence of aortocoronary bypass graft: Secondary | ICD-10-CM

## 2022-02-03 NOTE — Assessment & Plan Note (Signed)
History of morbid obesity with a BMI of 33 followed [facility] Dr[facility]  She is committed to losing 20 pounds when I see her next 1 year ago. ?

## 2022-02-03 NOTE — Assessment & Plan Note (Addendum)
History of dyslipidemia on statin therapy and Repatha with lipid profile performed 11/19/2020 revealing total cholesterol 95, LDL 35 and HDL 38. ?

## 2022-02-03 NOTE — Assessment & Plan Note (Signed)
History of coronary artery disease status post non-STEMI 07/29/2017 with catheterization performed Dr. Swaziland revealing three-vessel disease and normal LV function.  She had bypass grafting by Dr. Dorris Fetch the following day with a RIMA to the PDA, second diagonal branch and sequential radial to OM1 and OM 2.  Because of an abnormal Myoview performed 07/20/2018 with transient ischemic dilatation and left arm pain I recatheterized her 07/25/2018 revealing patent grafts.  She currently denies chest pain or shortness of breath. ?

## 2022-02-03 NOTE — Progress Notes (Signed)
? ? ? ?02/03/2022 ?[name]   ?08-Feb-1966  ?[id number] ? ?Primary Physician Seward Carol, MD ?Primary Cardiologist: Lorretta Harp MD [name], [place] ? ?HPI:  [name] [name] is a 56 y.o.  moderately overweight married African-American female mother of one 61 year old daughter who I last saw in the office 02/05/2021.. I met during her hospitalization on 07/29/17 when she was admitted with a non-STEMI. She required recatheterization that day by Dr. Martinique revealing three-vessel disease with normal LV function and bypass grafting the following day by Dr. Roxan Hockey is a RIMA to the PDA, vein to the second diagonal branch and sequential radial to OM 1 and 2. Her postoperative course is uncooperative. She was discharged on 08/06/17.  To briefly participate in cardiac rehab which was prematurely terminated because of hypertension issues. ?  ?Because of left arm pain she underwent Myoview stress testing 07/20/2018 which revealed transient ischemic dilatation which could have been an anginal equivalent.  Based on this I performed cardiac catheterization on her as an outpatient 07/25/2018 revealing widely patent grafts.  Her most recent lipid profile on statin therapy performed 02/22/2019 revealed total cholesterol 161, LDL of 96 and HDL 36. ?  ?Since I saw her a year ago she continues to do well.  She is seeing Dr. Raliegh Scarlet for weight loss, although her weight has not changed over the last year.  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  She works out 3 days a week doing treadmill .She denies chest pain or shortness of breath. ? ? ?Current Meds  ?Medication Sig  ? amLODipine (NORVASC) 10 MG tablet Take 1 tablet (10 mg total) by mouth daily.  ? aspirin 81 MG EC tablet Take 1 tablet (81 mg total) by mouth daily.  ? atorvastatin (LIPITOR) 20 MG tablet Take 20 mg by mouth daily.  ? Evolocumab (REPATHA SURECLICK) 683 MG/ML SOAJ Inject 140 mg into the skin every 14 (fourteen)  days.  ? metFORMIN (GLUCOPHAGE-XR) 500 MG 24 hr tablet Take 1,000 mg by mouth 2 (two) times daily.  ? metoprolol tartrate (LOPRESSOR) 100 MG tablet TAKE 1 TABLET BY MOUTH TWICE A DAY  ? tirzepatide (MOUNJARO) 5 MG/0.5ML Pen INJECT 5 MG SUBCUTANEOUSLY WEEKLY  ? valsartan (DIOVAN) 160 MG tablet TAKE 1 TABLET BY MOUTH EVERYDAY AT BEDTIME  ?  ? ?Allergies  ?Allergen Reactions  ? Penicillins Hives  ?  Has patient had a PCN reaction causing immediate rash, facial/tongue/throat swelling, SOB or lightheadedness with hypotension: unkn ?Has patient had a PCN reaction causing severe rash involving mucus membranes or skin necrosis: unkn ?Has patient had a PCN reaction that required hospitalization: no ?Has patient had a PCN reaction occurring within the last 10 years: no ?If all of the above answers are "NO", then may proceed with Cephalosporin use. ?  ? ? ?Social History  ? ?Socioeconomic History  ? Marital status: Married  ?  Spouse name: Kaybree Williams  ? Number of children: 1  ? Years of education: Not on file  ? Highest education level: Not on file  ?Occupational History  ? Occupation: Manufacturing engineer  ?Tobacco Use  ? Smoking status: Never  ? Smokeless tobacco: Never  ?Vaping Use  ? Vaping Use: Never used  ?Substance and Sexual Activity  ? Alcohol use: Yes  ? Drug use: No  ? Sexual activity: Not on file  ?Other Topics Concern  ? Not on file  ?Social History Narrative  ? Not  on file  ? ?Social Determinants of Health  ? ?Financial Resource Strain: Not on file  ?Food Insecurity: Not on file  ?Transportation Needs: Not on file  ?Physical Activity: Not on file  ?Stress: Not on file  ?Social Connections: Not on file  ?Intimate Partner Violence: Not on file  ?  ? ?Review of Systems: ?General: negative for chills, fever, night sweats or weight changes.  ?Cardiovascular: negative for chest pain, dyspnea on exertion, edema, orthopnea, palpitations, paroxysmal nocturnal dyspnea or shortness of breath ?Dermatological: negative  for rash ?Respiratory: negative for cough or wheezing ?Urologic: negative for hematuria ?Abdominal: negative for nausea, vomiting, diarrhea, bright red blood per rectum, melena, or hematemesis ?Neurologic: negative for visual changes, syncope, or dizziness ?All other systems reviewed and are otherwise negative except as noted above. ? ? ? ?Blood pressure 132/76, pulse 66, height 5' (1.524 m), weight 172 lb (78 kg), SpO2 97 %.  ?General appearance: alert and no distress ?Neck: no adenopathy, no carotid bruit, no JVD, supple, symmetrical, trachea midline, and thyroid not enlarged, symmetric, no tenderness/mass/nodules ?Lungs: clear to auscultation bilaterally ?Heart: regular rate and rhythm, S1, S2 normal, no murmur, click, rub or gallop ?Extremities: extremities normal, atraumatic, no cyanosis or edema ?Pulses: 2+ and symmetric ?Skin: Skin color, texture, turgor normal. No rashes or lesions ?Neurologic: Grossly normal ? ?EKG sinus rhythm at 66 with poor R wave progression.  I personally reviewed this EKG. ? ?ASSESSMENT AND PLAN:  ? ?Dyslipidemia ?History of dyslipidemia on statin therapy and Repatha with lipid profile performed 11/19/2020 revealing total cholesterol 95, LDL 35 and HDL 38. ? ?Essential hypertension ?History of essential hypertension blood pressure measured today at 132/76.  She is on amlodipine, and valsartan. ? ?Hx of CABG ?History of coronary artery disease status post non-STEMI 07/29/2017 with catheterization performed Dr. Martinique revealing three-vessel disease and normal LV function.  She had bypass grafting by Dr. Roxan Hockey the following day with a RIMA to the PDA, second diagonal branch and sequential radial to OM1 and OM 2.  Because of an abnormal Myoview performed 07/20/2018 with transient ischemic dilatation and left arm pain I recatheterized her 07/25/2018 revealing patent grafts.  She currently denies chest pain or shortness of breath. ? ?Obesity (BMI 30-39.9) ?History of morbid obesity with a  BMI of 33 followed [facility].  She is committed to losing 20 pounds when I see her next 1 year ago. ? ? ? ? ?Lorretta Harp MD FACP,FACC,FAHA, FSCAI ?02/03/2022 ?9:46 AM ?

## 2022-02-03 NOTE — Assessment & Plan Note (Signed)
History of essential hypertension blood pressure measured today at 132/76.  She is on amlodipine, and valsartan. ?

## 2022-02-03 NOTE — Patient Instructions (Signed)
Medication Instructions:  Your physician recommends that you continue on your current medications as directed. Please refer to the Current Medication list given to you today.  *If you need a refill on your cardiac medications before your next appointment, please call your pharmacy*   Lab Work: Your physician recommends that you have labs drawn today: Lipid/ Liver profile.  If you have labs (blood work) drawn today and your tests are completely normal, you will receive your results only by: MyChart Message (if you have MyChart) OR A paper copy in the mail If you have any lab test that is abnormal or we need to change your treatment, we will call you to review the results.   Follow-Up: At [facility], you and your health needs are our priority.  As part of our continuing mission to provide you with exceptional heart care, we have created designated Provider Care Teams.  These Care Teams include your primary Cardiologist (physician) and Advanced Practice Providers (APPs -  Physician Assistants and Nurse Practitioners) who all work together to provide you with the care you need, when you need it.  We recommend signing up for the patient portal called "MyChart".  Sign up information is provided on this After Visit Summary.  MyChart is used to connect with patients for Virtual Visits (Telemedicine).  Patients are able to view lab/test results, encounter notes, upcoming appointments, etc.  Non-urgent messages can be sent to your provider as well.   To learn more about what you can do with MyChart, go to [email or web address].    Your next appointment:   12 month(s)  The format for your next appointment:   In Person  Provider:   [name] [name], MD 

## 2022-02-04 LAB — HEPATIC FUNCTION PANEL
ALT: 16 IU/L (ref 0–32)
AST: 20 IU/L (ref 0–40)
Albumin: 4.8 g/dL (ref 3.8–4.9)
Alkaline Phosphatase: 76 IU/L (ref 44–121)
Bilirubin Total: 0.5 mg/dL (ref 0.0–1.2)
Bilirubin, Direct: 0.12 mg/dL (ref 0.00–0.40)
Total Protein: 8.1 g/dL (ref 6.0–8.5)

## 2022-02-04 LAB — LIPID PANEL
Chol/HDL Ratio: 3 ratio (ref 0.0–4.4)
Cholesterol, Total: 123 mg/dL (ref 100–199)
HDL: 41 mg/dL (ref >39)
LDL Chol Calc (NIH): 56 mg/dL (ref 0–99)
Triglycerides: 152 mg/dL — ABNORMAL HIGH (ref 0–149)
VLDL Cholesterol Cal: 26 mg/dL (ref 5–40)

## 2022-02-06 ENCOUNTER — Other Ambulatory Visit: Payer: Self-pay | Admitting: Cardiovascular Disease

## 2022-04-01 ENCOUNTER — Other Ambulatory Visit: Payer: 59

## 2022-04-02 LAB — HM DIABETES EYE EXAM

## 2022-04-07 ENCOUNTER — Ambulatory Visit: Payer: 59 | Admitting: Endocrinology

## 2022-04-13 ENCOUNTER — Encounter: Payer: Self-pay | Admitting: Endocrinology

## 2022-05-19 ENCOUNTER — Other Ambulatory Visit: Payer: Self-pay | Admitting: Cardiovascular Disease

## 2022-05-27 ENCOUNTER — Encounter (INDEPENDENT_AMBULATORY_CARE_PROVIDER_SITE_OTHER): Payer: Self-pay

## 2022-06-05 ENCOUNTER — Other Ambulatory Visit (INDEPENDENT_AMBULATORY_CARE_PROVIDER_SITE_OTHER): Payer: 59

## 2022-06-05 DIAGNOSIS — E559 Vitamin D deficiency, unspecified: Secondary | ICD-10-CM | POA: Diagnosis not present

## 2022-06-05 DIAGNOSIS — E1165 Type 2 diabetes mellitus with hyperglycemia: Secondary | ICD-10-CM

## 2022-06-05 LAB — COMPREHENSIVE METABOLIC PANEL
ALT: 16 U/L (ref 0–35)
AST: 20 U/L (ref 0–37)
Albumin: 4.6 g/dL (ref 3.5–5.2)
Alkaline Phosphatase: 58 U/L (ref 39–117)
BUN: 19 mg/dL (ref 6–23)
CO2: 27 mEq/L (ref 19–32)
Calcium: 11.2 mg/dL — ABNORMAL HIGH (ref 8.4–10.5)
Chloride: 103 mEq/L (ref 96–112)
Creatinine, Ser: 0.86 mg/dL (ref 0.40–1.20)
GFR: 75.4 mL/min (ref >60.00)
Glucose, Bld: 113 mg/dL — ABNORMAL HIGH (ref 70–99)
Potassium: 4.3 mEq/L (ref 3.5–5.1)
Sodium: 137 mEq/L (ref 135–145)
Total Bilirubin: 0.5 mg/dL (ref 0.2–1.2)
Total Protein: 8.2 g/dL (ref 6.0–8.3)

## 2022-06-05 LAB — HEMOGLOBIN A1C: Hgb A1c MFr Bld: 7.1 % — ABNORMAL HIGH (ref 4.6–6.5)

## 2022-06-05 LAB — VITAMIN D 25 HYDROXY (VIT D DEFICIENCY, FRACTURES): VITD: 25.52 ng/mL — ABNORMAL LOW (ref 30.00–100.00)

## 2022-06-11 ENCOUNTER — Ambulatory Visit: Payer: 59 | Admitting: Endocrinology

## 2022-06-11 ENCOUNTER — Encounter: Payer: Self-pay | Admitting: Endocrinology

## 2022-06-11 VITALS — BP 106/70 | HR 78 | Ht 60.0 in | Wt 174.6 lb

## 2022-06-11 DIAGNOSIS — E213 Hyperparathyroidism, unspecified: Secondary | ICD-10-CM | POA: Diagnosis not present

## 2022-06-11 DIAGNOSIS — E1165 Type 2 diabetes mellitus with hyperglycemia: Secondary | ICD-10-CM

## 2022-06-11 DIAGNOSIS — E559 Vitamin D deficiency, unspecified: Secondary | ICD-10-CM

## 2022-06-11 NOTE — Progress Notes (Signed)
Patient ID: [name] [name], female   DOB: 1966-08-11, 56 y.o.   MRN: [id number]              Chief complaint: Endocrinology follow-up   History of Present Illness:  Problem 1: DIABETES type II  She was initially diagnosed in 2017   Medication regimen: Metformin ER 1500 mg daily, Mounjaro 5 mg weekly  Current management, level of control, blood sugar readings, problems identified:  A1c is 7.1, was 6.9 compared to 7.2    She has continued to do overall better with Mounjaro compared Rybelsus However has not lost any further weight She did not bring her monitor for download and not clear what her blood sugars are at different times  Lab glucose was 113 fasting Is finding time to exercise and is trying to walk about 2 miles on several days a week Has taken [facility] regularly without side effects of bloating and nausea as before No diarrhea with 3 tablets of metformin daily   Last consultation with dietitian: 7/21  Blood sugar data recent average about 137  Previous data  PREMEAL average 124, range 96-127 POSTMEAL average 138, range 125-158  PRE-MEAL Fasting Lunch Dinner Bedtime Overall  Glucose range: ?    96-158  Mean/median:     134     Wt Readings from Last 3 Encounters:  06/11/22 174 lb 9.6 oz (79.2 kg)  02/03/22 172 lb (78 kg)  12/23/21 172 lb 6.4 oz (78.2 kg)    Lab Results  Component Value Date   HGBA1C 7.1 (H) 06/05/2022   HGBA1C 6.9 (H) 12/16/2021   HGBA1C 7.2 (H) 09/02/2021   Lab Results  Component Value Date   MICROALBUR 1.8 09/02/2021   LDLCALC 56 02/03/2022   CREATININE 0.86 06/05/2022    HYPERCALCEMIA:  Review of records show that she had a high calcium of 11.2 done in October 2017 The patient thinks that she has had higher calcium before also but no records are available Apparently at some point she was taking HCTZ and this was stopped with some improvement in calcium  PTH level from unknown lab 29 done in 10/17 and subsequently 32 in  8/19  RECENT history: Her serum calcium has been persistently high and ranging between 10.5 -11.4 since 12/18 It is now about the same at 11.2  Has been asymptomatic as before with no history of fractures or kidney stones No osteoporosis on previous bone density from 09/2017  Lab Results  Component Value Date   CALCIUM 11.2 (H) 06/05/2022   CALCIUM 11.3 (H) 12/16/2021   CALCIUM 10.8 (H) 09/02/2021   CALCIUM 10.9 (H) 04/08/2021   CALCIUM 10.8 (H) 11/19/2020   CALCIUM 11.4 (H) 08/15/2020   CALCIUM 11.0 (H) 04/23/2020   CALCIUM 10.7 (H) 01/18/2020   CALCIUM 11.2 (H) 07/24/2019   Lab Results  Component Value Date   PTH 32 05/23/2018   CALCIUM 11.2 (H) 06/05/2022   CAION 1.31 07/31/2017   PHOS 3.2 10/01/2017       VITAMIN D deficiency:  She has been on vitamin D supplements for persistently low levels below 20 since at least 2018  She is taking vitamin D3, 4000 units she only   Vitamin D level is back to below normal now  Lab Results  Component Value Date   VD25OH 25.52 (L) 06/05/2022   VD25OH 41.83 09/02/2021   VD25OH 48.5 11/19/2020   VD25OH 29.29 (L) 08/15/2020   VD25OH 35.56 01/18/2020        Allergies  as of 06/11/2022       Reactions   Penicillins Hives   Has patient had a PCN reaction causing immediate rash, facial/tongue/throat swelling, SOB or lightheadedness with hypotension: unkn Has patient had a PCN reaction causing severe rash involving mucus membranes or skin necrosis: unkn Has patient had a PCN reaction that required hospitalization: no Has patient had a PCN reaction occurring within the last 10 years: no If all of the above answers are "NO", then may proceed with Cephalosporin use.        Medication List        Accurate as of June 11, 2022  3:31 PM. If you have any questions, ask your nurse or doctor.          amLODipine 10 MG tablet Commonly known as: NORVASC TAKE 1 TABLET BY MOUTH EVERY DAY   aspirin EC 81 MG tablet Take 1  tablet (81 mg total) by mouth daily.   atorvastatin 20 MG tablet Commonly known as: LIPITOR TAKE 1 TABLET BY MOUTH EVERY DAY   metFORMIN 500 MG 24 hr tablet Commonly known as: GLUCOPHAGE-XR Take 1,000 mg by mouth 2 (two) times daily.   metoprolol tartrate 100 MG tablet Commonly known as: LOPRESSOR TAKE 1 TABLET BY MOUTH TWICE A DAY   Mounjaro 5 MG/0.5ML Pen Generic drug: tirzepatide INJECT 5 MG SUBCUTANEOUSLY WEEKLY   Repatha SureClick 140 MG/ML Soaj Generic drug: Evolocumab INJECT 140MG  ( 1 PEN) INTO THE SKIN EVERY 14 DAYS   valsartan 160 MG tablet Commonly known as: DIOVAN TAKE 1 TABLET BY MOUTH EVERYDAY AT BEDTIME        Allergies:  Allergies  Allergen Reactions   Penicillins Hives    Has patient had a PCN reaction causing immediate rash, facial/tongue/throat swelling, SOB or lightheadedness with hypotension: unkn Has patient had a PCN reaction causing severe rash involving mucus membranes or skin necrosis: unkn Has patient had a PCN reaction that required hospitalization: no Has patient had a PCN reaction occurring within the last 10 years: no If all of the above answers are "NO", then may proceed with Cephalosporin use.     Past Medical History:  Diagnosis Date   Anxiety    Constipation    Coronary artery disease    Diabetes mellitus without complication (HCC)    Heart disease    History of heart attack    Hyperlipidemia    Hypertension    Obesity    SOBOE (shortness of breath on exertion)    Swelling of both lower extremities    Trouble in sleeping    Vitamin D deficiency     Past Surgical History:  Procedure Laterality Date   CARDIAC CATHETERIZATION     CESAREAN SECTION  12/13/1998   CORONARY ARTERY BYPASS GRAFT N/A 07/30/2017   Procedure: CORONARY ARTERY BYPASS GRAFTING (CABG)  x four, using right internal mammary artery, right greater saphenous vein, left radial artery;  Surgeon: 09/29/2017, MD;  Location: [facility] OR;  Service: Open Heart  Surgery;  Laterality: N/A;   INTRAOPERATIVE TRANSESOPHAGEAL ECHOCARDIOGRAM N/A 07/30/2017   Procedure: INTRAOPERATIVE TRANSESOPHAGEAL ECHOCARDIOGRAM;  Surgeon: 09/29/2017, MD;  Location: [facility] OR;  Service: Open Heart Surgery;  Laterality: N/A;   LEFT HEART CATH AND CORONARY ANGIOGRAPHY N/A 07/29/2017   Procedure: LEFT HEART CATH AND CORONARY ANGIOGRAPHY;  Surgeon: 09/28/2017, Peter M, MD;  Location: [facility] INVASIVE CV LAB;  Service: Cardiovascular;  Laterality: N/A;   LEFT HEART CATH AND CORS/GRAFTS ANGIOGRAPHY N/A 07/25/2018   Procedure:  LEFT HEART CATH AND CORS/GRAFTS ANGIOGRAPHY;  Surgeon: Runell Gess, MD;  Location: [facility] INVASIVE CV LAB;  Service: Cardiovascular;  Laterality: N/A;   MYOMECTOMY  09/1994   PARTIAL HYSTERECTOMY  2005   RADIAL ARTERY HARVEST Left 07/30/2017   Procedure: RADIAL ARTERY HARVEST;  Surgeon: Loreli Slot, MD;  Location: [facility] OR;  Service: Open Heart Surgery;  Laterality: Left;    Family History  Problem Relation Age of Onset   Hypertension Other    Hypertension Mother    Hyperlipidemia Mother    Obesity Mother    Diabetes Father    Hypertension Father    Hyperlipidemia Father     Social History:  reports that she has never smoked. She has never used smokeless tobacco. She reports current alcohol use. She reports that she does not use drugs.  Review of Systems  HYPERCHOLESTEROLEMIA: Followed by PCP/cardiologist   She does have a history of CAD and now she is on Repatha also along with reduced dose of Lipitor 20 mg.   Lab Results  Component Value Date   CHOL 123 02/03/2022   CHOL 95 (L) 11/19/2020   CHOL 161 02/21/2020   Lab Results  Component Value Date   HDL 41 02/03/2022   HDL 38 (L) 11/19/2020   HDL 36 (L) 02/21/2020   Lab Results  Component Value Date   LDLCALC 56 02/03/2022   LDLCALC 35 11/19/2020   LDLCALC 104 (H) 02/21/2020   Lab Results  Component Value Date   TRIG 152 (H) 02/03/2022   TRIG 120 11/19/2020   TRIG 113  02/21/2020   Lab Results  Component Value Date   CHOLHDL 3.0 02/03/2022   CHOLHDL 2.5 11/19/2020   CHOLHDL 4.5 (H) 02/21/2020   No results found for: "LDLDIRECT"   HYPERTENSION: This is being followed by her PCP  BP Readings from Last 3 Encounters:  06/11/22 106/70  02/03/22 132/76  12/23/21 (!) 140/98      EXAM:  BP 106/70   Pulse 78   Ht 5' (1.524 m)   Wt 174 lb 9.6 oz (79.2 kg)   SpO2 (!) 87%   BMI 34.10 kg/m    Assessment/Plan:   DIABETES type 2, non-insulin-dependent:  Her A1c is about the same at 7.1 better at 6.9  She is currently taking Mounjaro 5 mg weekly and Metformin ER 1500 mg daily  Her blood sugars are reportedly being checked at different times of the day and are in the 130s over 140 average range recently but no details available No further GI side effects with Mounjaro Overall blood sugars are fairly good with recent average about 134  Also recently starting to do some walking for exercise   Recommendations:  Continue regular exercise She needs to bring her monitor or fax her blood sugars to the office regularly She does still need to check blood sugars by rotation at different times both after meals and before breakfast May try 7.5 mg Mounjaro since she can benefit from further weight loss and better diabetes control overall  If this is effective without nausea she can go to the 10 mg after 1 month Encouraged her to work on her calorie intake for further weight loss  HYPERCALCEMIA:  Has asymptomatic hyperparathyroidism Calcium now 11.2 We will continue long-term follow-up   History of vitamin D deficiency: She will take 6000 units vitamin D3 daily and after she finishes her current supply can try 5000 units   Will need bone density at the end  of the year    Elayne Snare 06/11/2022, 3:31 PM     Note: This office note was prepared with Dragon voice recognition system technology. Any transcriptional errors that result from this  process are unintentional.

## 2022-06-11 NOTE — Patient Instructions (Signed)
Take 3 Vitamin D pills

## 2022-06-12 MED ORDER — TIRZEPATIDE 7.5 MG/0.5ML ~~LOC~~ SOAJ: 7.5000 mg | SUBCUTANEOUS | 0 refills | Status: DC

## 2022-06-30 ENCOUNTER — Ambulatory Visit: Payer: 59 | Admitting: Endocrinology

## 2022-07-11 ENCOUNTER — Other Ambulatory Visit: Payer: Self-pay | Admitting: Endocrinology

## 2022-07-13 ENCOUNTER — Other Ambulatory Visit: Payer: Self-pay

## 2022-07-13 DIAGNOSIS — E1165 Type 2 diabetes mellitus with hyperglycemia: Secondary | ICD-10-CM

## 2022-07-14 MED ORDER — TIRZEPATIDE 10 MG/0.5ML ~~LOC~~ SOAJ: 10.0000 mg | SUBCUTANEOUS | 1 refills | Status: DC

## 2022-08-31 ENCOUNTER — Telehealth: Payer: Self-pay | Admitting: Pharmacist Clinician (PhC)/ Clinical Pharmacy Specialist

## 2022-08-31 NOTE — Telephone Encounter (Signed)
Repatha PA sent and approved to 08/31/2023  Key [id number]

## 2022-10-29 ENCOUNTER — Other Ambulatory Visit: Payer: 59

## 2022-10-29 IMAGING — MG MM DIGITAL SCREENING BILAT W/ TOMO AND CAD
8 series · 8 of 24 positions shown · non-contrast
Comparison: Previous exam(s).

CLINICAL DATA: Screening.

EXAM:
DIGITAL SCREENING BILATERAL MAMMOGRAM WITH TOMOSYNTHESIS AND CAD
TECHNIQUE: Bilateral screening digital craniocaudal and mediolateral oblique
mammograms were obtained. Bilateral screening digital breast
tomosynthesis was performed. The images were evaluated with
computer-aided detection.

[L MLO synth-2D]
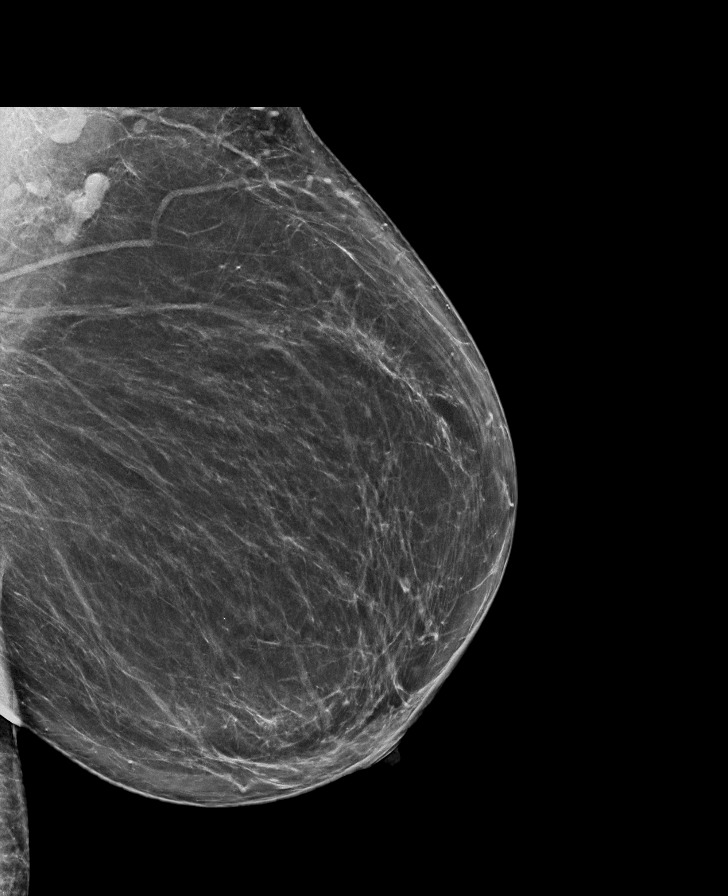

[L CC synth-2D]
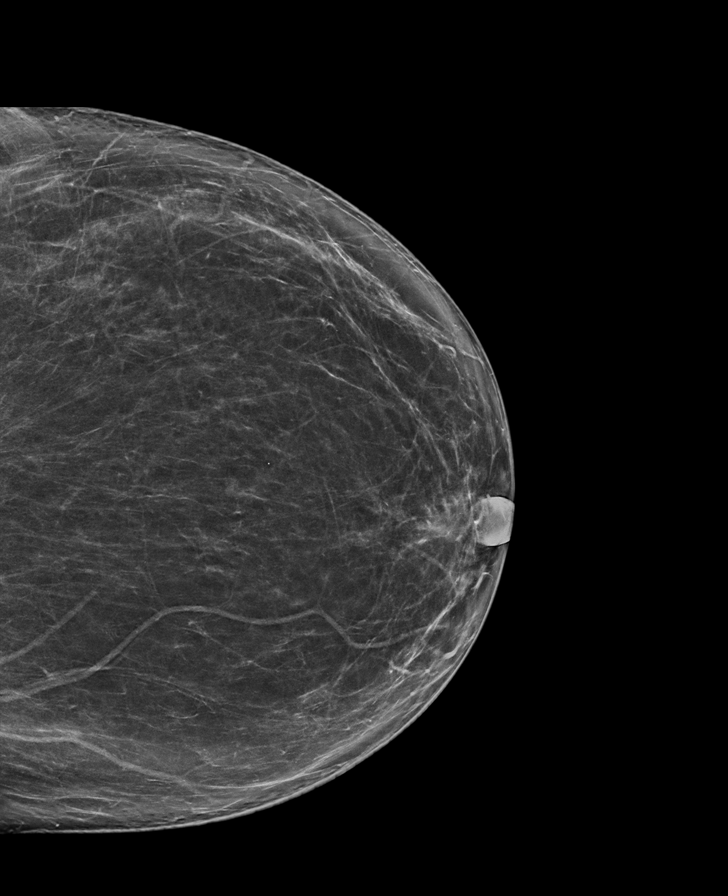

[R CC synth-2D]
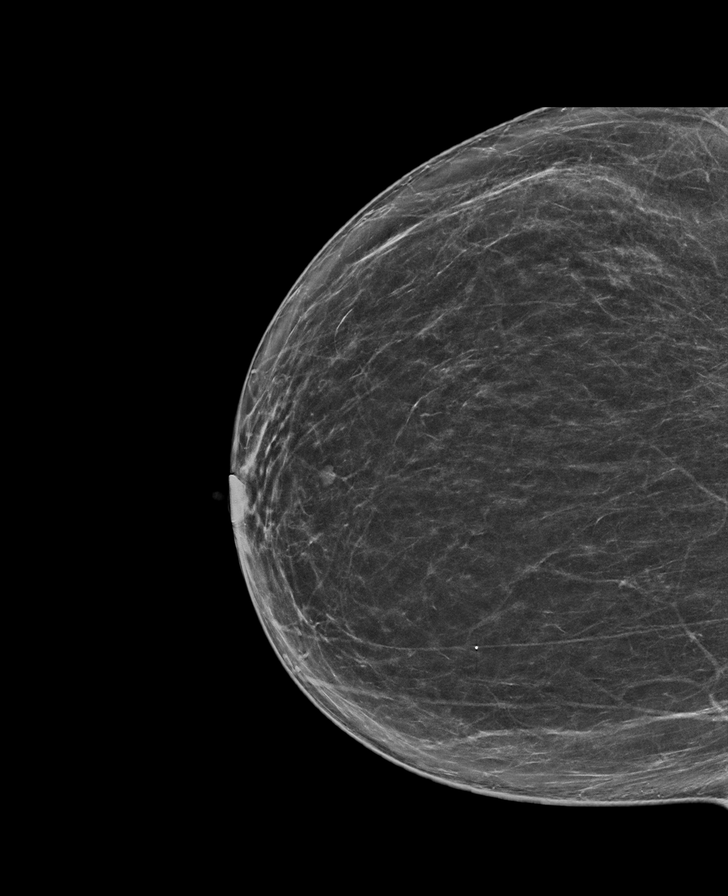

[R MLO synth-2D]
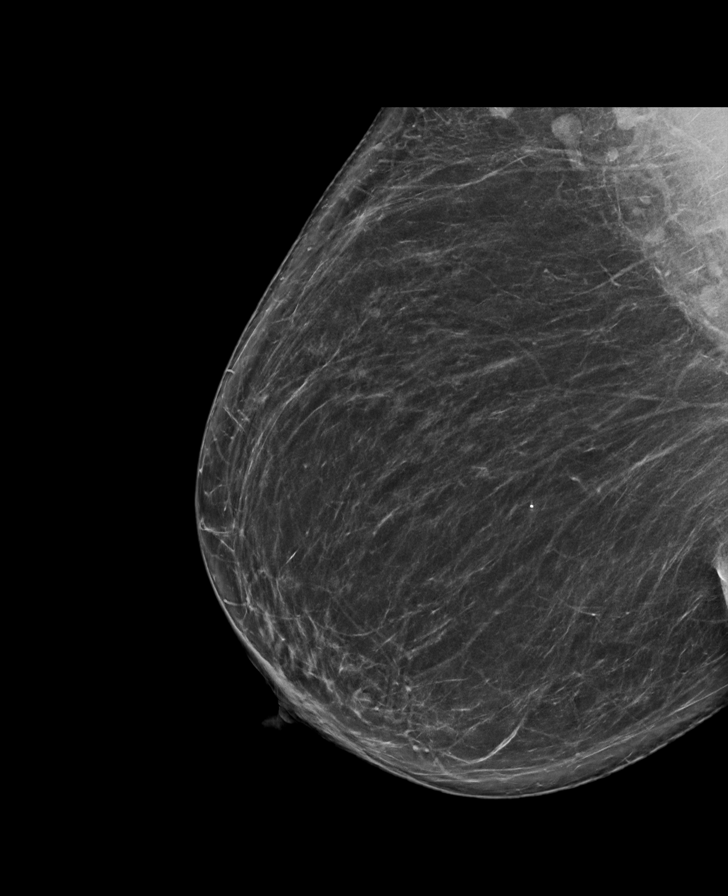

[L MLO tomo · tomo slice 45/88.0]
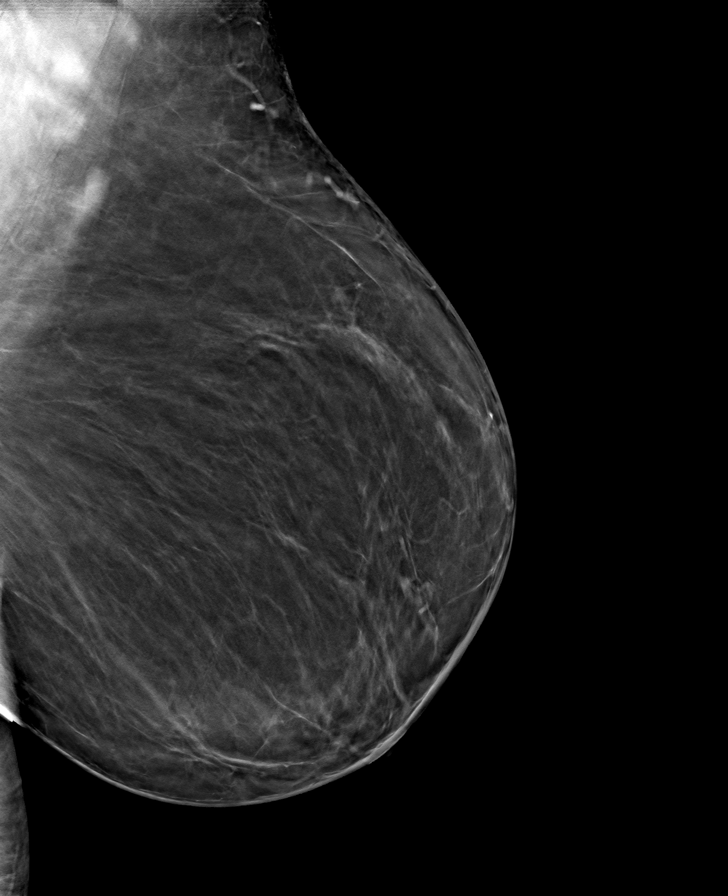

[R CC tomo · tomo slice 41/81.0]
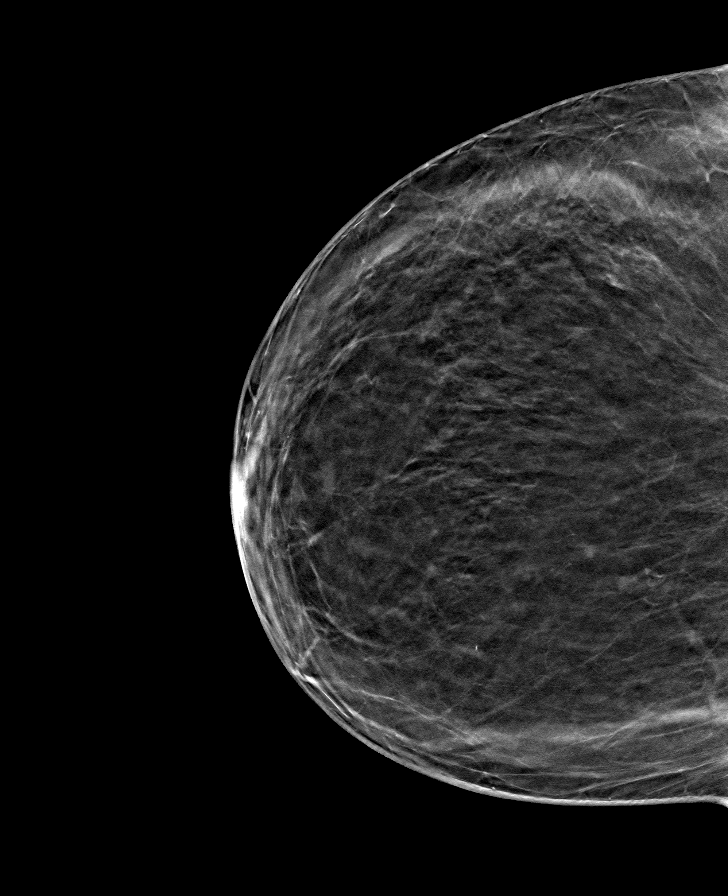

[R MLO tomo · tomo slice 45/89.0]
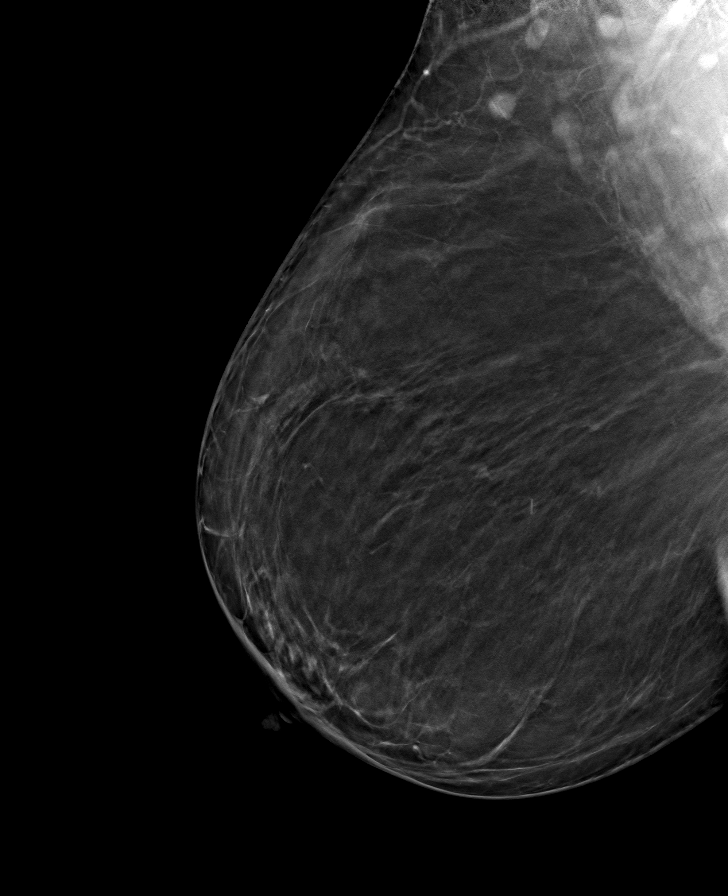

[L CC tomo · tomo slice 41/81.0]
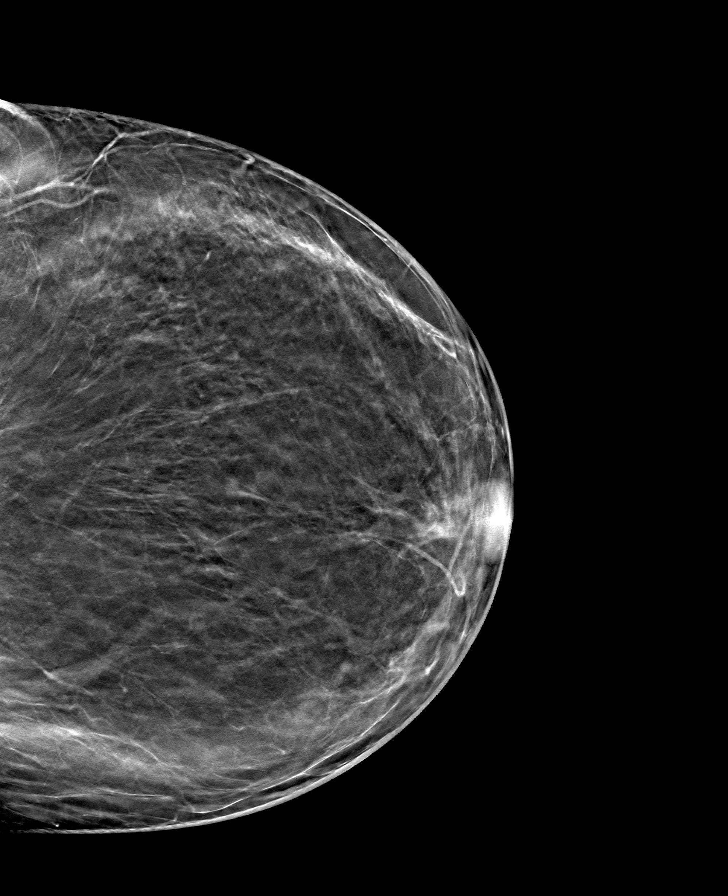

[8 of 24 positions shown; findings below may reference images not displayed]

ACR Breast Density Category b: There are scattered areas of
fibroglandular density.
FINDINGS: There are no findings suspicious for malignancy.
IMPRESSION: No mammographic evidence of malignancy. A result letter of this
screening mammogram will be mailed directly to the patient.

RECOMMENDATION:
Screening mammogram in one year. (Code:51-O-LD2)

BI-RADS CATEGORY  1: Negative.

## 2022-11-02 ENCOUNTER — Ambulatory Visit: Payer: 59 | Admitting: Endocrinology

## 2022-12-15 ENCOUNTER — Other Ambulatory Visit: Payer: Self-pay | Admitting: Endocrinology

## 2022-12-16 ENCOUNTER — Telehealth: Payer: Self-pay

## 2022-12-16 ENCOUNTER — Other Ambulatory Visit: Payer: Self-pay | Admitting: Endocrinology

## 2022-12-16 DIAGNOSIS — E1165 Type 2 diabetes mellitus with hyperglycemia: Secondary | ICD-10-CM

## 2022-12-16 NOTE — Telephone Encounter (Signed)
Med change request of Mounjaro 10 MG  Product Backordered/Unavailable.

## 2022-12-16 NOTE — Telephone Encounter (Signed)
RX pending, message sent to provider.

## 2022-12-17 NOTE — Telephone Encounter (Signed)
Per [employer] down stairs no body has this strength. They did have the '5mg'$ .

## 2022-12-18 ENCOUNTER — Other Ambulatory Visit: Payer: Self-pay

## 2022-12-18 MED ORDER — TIRZEPATIDE 5 MG/0.5ML ~~LOC~~ SOAJ
SUBCUTANEOUS | 0 refills | Status: DC
  Filled 2022-12-18: qty 2, 28d supply, fill #0

## 2022-12-18 NOTE — Telephone Encounter (Signed)
Per [employer] down stairs no body has this strength. They did have the '5mg'$ .

## 2022-12-18 NOTE — Addendum Note (Signed)
Addended by: Sarina Ill on: 12/18/2022 04:00 PM   Modules accepted: Orders

## 2022-12-18 NOTE — Telephone Encounter (Signed)
Pt informed and rx sent

## 2022-12-21 ENCOUNTER — Other Ambulatory Visit: Payer: Self-pay

## 2023-01-13 ENCOUNTER — Other Ambulatory Visit (INDEPENDENT_AMBULATORY_CARE_PROVIDER_SITE_OTHER): Payer: 59

## 2023-01-13 DIAGNOSIS — E1165 Type 2 diabetes mellitus with hyperglycemia: Secondary | ICD-10-CM | POA: Diagnosis not present

## 2023-01-13 DIAGNOSIS — E559 Vitamin D deficiency, unspecified: Secondary | ICD-10-CM | POA: Diagnosis not present

## 2023-01-14 LAB — BASIC METABOLIC PANEL
BUN: 15 mg/dL (ref 6–23)
CO2: 26 mEq/L (ref 19–32)
Calcium: 10.9 mg/dL — ABNORMAL HIGH (ref 8.4–10.5)
Chloride: 103 mEq/L (ref 96–112)
Creatinine, Ser: 0.75 mg/dL (ref 0.40–1.20)
GFR: 88.47 mL/min (ref >60.00)
Glucose, Bld: 84 mg/dL (ref 70–99)
Potassium: 3.9 mEq/L (ref 3.5–5.1)
Sodium: 137 mEq/L (ref 135–145)

## 2023-01-14 LAB — VITAMIN D 25 HYDROXY (VIT D DEFICIENCY, FRACTURES): VITD: 30.89 ng/mL (ref 30.00–100.00)

## 2023-01-14 LAB — HEMOGLOBIN A1C: Hgb A1c MFr Bld: 7 % — ABNORMAL HIGH (ref 4.6–6.5)

## 2023-01-18 ENCOUNTER — Ambulatory Visit: Payer: 59 | Admitting: Endocrinology

## 2023-01-18 ENCOUNTER — Other Ambulatory Visit: Payer: Self-pay | Admitting: *Deleted

## 2023-01-18 VITALS — BP 112/64 | HR 74 | Ht 60.0 in | Wt 172.0 lb

## 2023-01-18 DIAGNOSIS — E213 Hyperparathyroidism, unspecified: Secondary | ICD-10-CM | POA: Diagnosis not present

## 2023-01-18 DIAGNOSIS — E1165 Type 2 diabetes mellitus with hyperglycemia: Secondary | ICD-10-CM

## 2023-01-18 MED ORDER — TIRZEPATIDE 10 MG/0.5ML ~~LOC~~ SOAJ: 10.0000 mg | SUBCUTANEOUS | 1 refills | Status: DC

## 2023-01-18 NOTE — Progress Notes (Unsigned)
Patient ID: [name] [name], female   DOB: [birth year], 57 y.o.   MRN: [id number]              Chief complaint: Endocrinology follow-up   History of Present Illness:  Problem 1: DIABETES type II  She was initially diagnosed in 2017   Medication regimen: Metformin ER 1500 mg daily, Mounjaro 10 mg weekly  Current management, level of control, blood sugar readings, problems identified:  A1c is 7.0, was 7.1   She has not been seen since last August She is using the [place] monitor from her insurance but has only few blood sugars Most of her readings are either fasting or before dinner and only a few readings after meals Highest blood sugar recently is 155 fasting Otherwise blood sugars mostly in the low to mid 100s  Because of difficulty with availability she has not been on the 10 mg Mounjaro consistently and alternating between 7.5 and 10 However recently has been only able to take 5 mg dose since last month She says she is trying to do a little walking recently either indoors or outside Weight however has leveled off Also taking metformin which is well-tolerated   Last consultation with dietitian: 7/21  Current monitor being used: [place]  Blood sugar data recent average about 137 for 30 days  Previous data  PREMEAL average 124, range 96-127 POSTMEAL average 138, range 125-158   Wt Readings from Last 3 Encounters:  06/11/22 174 lb 9.6 oz (79.2 kg)  02/03/22 172 lb (78 kg)  12/23/21 172 lb 6.4 oz (78.2 kg)    Lab Results  Component Value Date   HGBA1C 7.0 (H) 01/13/2023   HGBA1C 7.1 (H) 06/05/2022   HGBA1C 6.9 (H) 12/16/2021   Lab Results  Component Value Date   MICROALBUR 1.8 09/02/2021   LDLCALC 56 02/03/2022   CREATININE 0.75 01/13/2023    HYPERCALCEMIA:  Review of records show that she had a high calcium of 11.2 done in October 2017 The patient thinks that she has had higher calcium before also but no records are available Apparently at some  point she was taking HCTZ and this was stopped with some improvement in calcium  PTH level from unknown lab 29 done in 10/17 and subsequently 32 in 8/19  RECENT history: Her serum calcium has been persistently high and ranging between 10.5 -11.4 since 12/18 It is now about the same at 11.2  Has been asymptomatic as before with no history of fractures or kidney stones No osteoporosis on previous bone density from 09/2017  Lab Results  Component Value Date   CALCIUM 10.9 (H) 01/13/2023   CALCIUM 11.2 (H) 06/05/2022   CALCIUM 11.3 (H) 12/16/2021   CALCIUM 10.8 (H) 09/02/2021   CALCIUM 10.9 (H) 04/08/2021   CALCIUM 10.8 (H) 11/19/2020   CALCIUM 11.4 (H) 08/15/2020   CALCIUM 11.0 (H) 04/23/2020   CALCIUM 10.7 (H) 01/18/2020   Lab Results  Component Value Date   PTH 32 05/23/2018   CALCIUM 10.9 (H) 01/13/2023   CAION 1.31 07/31/2017   PHOS 3.2 10/01/2017       VITAMIN D deficiency:  She has been on vitamin D supplements for persistently low levels below 20 since at least 2018  She is taking vitamin D3, 4000 units  Vitamin D level is ok  Lab Results  Component Value Date   VD25OH 30.89 01/13/2023   VD25OH 25.52 (L) 06/05/2022   VD25OH 41.83 09/02/2021   VD25OH 48.5 11/19/2020   VD25OH  29.29 (L) 08/15/2020        Allergies as of 01/18/2023       Reactions   Penicillins Hives   Has patient had a PCN reaction causing immediate rash, facial/tongue/throat swelling, SOB or lightheadedness with hypotension: unkn Has patient had a PCN reaction causing severe rash involving mucus membranes or skin necrosis: unkn Has patient had a PCN reaction that required hospitalization: no Has patient had a PCN reaction occurring within the last 10 years: no If all of the above answers are "NO", then may proceed with Cephalosporin use.        Medication List        Accurate as of January 18, 2023 11:59 PM. If you have any questions, ask your nurse or doctor.           amLODipine 10 MG tablet Commonly known as: NORVASC TAKE 1 TABLET BY MOUTH EVERY DAY   aspirin EC 81 MG tablet Take 1 tablet (81 mg total) by mouth daily.   atorvastatin 20 MG tablet Commonly known as: LIPITOR TAKE 1 TABLET BY MOUTH EVERY DAY   metFORMIN 500 MG 24 hr tablet Commonly known as: GLUCOPHAGE-XR Take 1,000 mg by mouth 2 (two) times daily.   metoprolol tartrate 100 MG tablet Commonly known as: LOPRESSOR TAKE 1 TABLET BY MOUTH TWICE A DAY   Mounjaro 5 MG/0.5ML Pen Generic drug: tirzepatide Inject 5 mg weekly   tirzepatide 10 MG/0.5ML Pen Commonly known as: MOUNJARO Inject 10 mg into the skin once a week.   Repatha SureClick [id number] MG/ML Soaj Generic drug: Evolocumab INJECT 140MG  ( 1 PEN) INTO THE SKIN EVERY 14 DAYS   valsartan 160 MG tablet Commonly known as: DIOVAN TAKE 1 TABLET BY MOUTH EVERYDAY AT BEDTIME        Allergies:  Allergies  Allergen Reactions  . Penicillins Hives    Has patient had a PCN reaction causing immediate rash, facial/tongue/throat swelling, SOB or lightheadedness with hypotension: unkn Has patient had a PCN reaction causing severe rash involving mucus membranes or skin necrosis: unkn Has patient had a PCN reaction that required hospitalization: no Has patient had a PCN reaction occurring within the last 10 years: no If all of the above answers are "NO", then may proceed with Cephalosporin use.     Past Medical History:  Diagnosis Date  . Anxiety   . Constipation   . Coronary artery disease   . Diabetes mellitus without complication   . Heart disease   . History of heart attack   . Hyperlipidemia   . Hypertension   . Obesity   . SOBOE (shortness of breath on exertion)   . Swelling of both lower extremities   . Trouble in sleeping   . Vitamin D deficiency     Past Surgical History:  Procedure Laterality Date  . CARDIAC CATHETERIZATION    . CESAREAN SECTION  12/13/1998  . CORONARY ARTERY BYPASS GRAFT N/A 07/30/2017    Procedure: CORONARY ARTERY BYPASS GRAFTING (CABG)  x four, using right internal mammary artery, right greater saphenous vein, left radial artery;  Surgeon: Melrose Nakayama, MD;  Location: [place] [facility];  Service: Open Heart Surgery;  Laterality: N/A;  . INTRAOPERATIVE TRANSESOPHAGEAL ECHOCARDIOGRAM N/A 07/30/2017   Procedure: INTRAOPERATIVE TRANSESOPHAGEAL ECHOCARDIOGRAM;  Surgeon: Melrose Nakayama, MD;  Location: [facility];  Service: Open Heart Surgery;  Laterality: N/A;  . LEFT HEART CATH AND CORONARY ANGIOGRAPHY N/A 07/29/2017   Procedure: LEFT HEART CATH AND CORONARY ANGIOGRAPHY;  Surgeon: Martinique, Peter  M, MD;  Location: [facility];  Service: Cardiovascular;  Laterality: N/A;  . LEFT HEART CATH AND CORS/GRAFTS ANGIOGRAPHY N/A 07/25/2018   Procedure: LEFT HEART CATH AND CORS/GRAFTS ANGIOGRAPHY;  Surgeon: Lorretta Harp, MD;  Location: [facility];  Service: Cardiovascular;  Laterality: N/A;  . MYOMECTOMY  09/1994  . PARTIAL HYSTERECTOMY  2005  . RADIAL ARTERY HARVEST Left 07/30/2017   Procedure: RADIAL ARTERY HARVEST;  Surgeon: Melrose Nakayama, MD;  Location: [facility];  Service: Open Heart Surgery;  Laterality: Left;    Family History  Problem Relation Age of Onset  . Hypertension Other   . Hypertension Mother   . Hyperlipidemia Mother   . Obesity Mother   . Diabetes Father   . Hypertension Father   . Hyperlipidemia Father     Social History:  reports that she has never smoked. She has never used smokeless tobacco. She reports current alcohol use. She reports that she does not use drugs.  Review of Systems  HYPERCHOLESTEROLEMIA: Followed by PCP/cardiologist   She does have a history of CAD and she is on Repatha also along with a dose of Lipitor 20 mg.   Lab Results  Component Value Date   CHOL 123 02/03/2022   CHOL 95 (L) 11/19/2020   CHOL 161 02/21/2020   Lab Results  Component Value Date   HDL 41 02/03/2022   HDL 38 (L) 11/19/2020   HDL 36 (L)  02/21/2020   Lab Results  Component Value Date   LDLCALC 56 02/03/2022   LDLCALC 35 11/19/2020   LDLCALC 104 (H) 02/21/2020   Lab Results  Component Value Date   TRIG 152 (H) 02/03/2022   TRIG 120 11/19/2020   TRIG 113 02/21/2020   Lab Results  Component Value Date   CHOLHDL 3.0 02/03/2022   CHOLHDL 2.5 11/19/2020   CHOLHDL 4.5 (H) 02/21/2020   No results found for: "LDLDIRECT"   HYPERTENSION: This is being followed by her PCP  Recent blood pressure at home: 112/64 No lightheadedness  BP Readings from Last 3 Encounters:  06/11/22 106/70  02/03/22 132/76  12/23/21 (!) 140/98      EXAM:  There were no vitals taken for this visit.   Assessment/Plan:   DIABETES type 2, non-insulin-dependent:  Her A1c is about the same at 7  She is currently taking Mounjaro 5 mg weekly and Metformin ER 1500 mg daily  Her blood sugars are being checked infrequently but at different times of the day  Has not done enough readings after her main meal in the evening but generally postprandial readings are not high She is tolerating Mounjaro but is only able to get 5 mg instead of the 10 mg recommended previously Currently not losing weight Overall blood sugars are fairly good with recent average about 137 Also recently starting to do some walking for exercise   Recommendations:  Needs regular exercise She we will try to go up on the dose of Mounjaro when the higher doses are available and eventually go to 10 mg More blood sugar monitoring after dinner She will try to monitor her portions and carbohydrates to enable some weight loss Also continue metformin  HYPERCALCEMIA:  Has asymptomatic hyperparathyroidism Calcium now better at 10.9 Will continue long-term follow-up   History of vitamin D deficiency: Continue supplementation as before She can take the prescription vitamin D twice a month for convenience after she finishes her current OTC supply    Elayne Snare 01/19/2023, 4:46  PM     Note: This office note was prepared with Estate agent. Any transcriptional errors that result from this process are unintentional.

## 2023-01-18 NOTE — Patient Instructions (Signed)
Check blood sugars on waking up 2-3 days a week  Also check blood sugars about 2 hours after meals and do this after different meals by rotation  Recommended blood sugar levels on waking up are 90-130 and about 2 hours after meal is 130-160  Please bring your blood sugar monitor to each visit, thank you   

## 2023-01-19 ENCOUNTER — Encounter: Payer: Self-pay | Admitting: Endocrinology

## 2023-01-20 ENCOUNTER — Other Ambulatory Visit: Payer: Self-pay | Admitting: Endocrinology

## 2023-01-20 ENCOUNTER — Telehealth: Payer: Self-pay | Admitting: Physician Assistant

## 2023-01-20 NOTE — Telephone Encounter (Signed)
   The patient called the answering service after-hours today. Delayed callback to patient due to emergency at the hospital occurring at the same time outpatient call was paged out. Apologized for the delay.  She is on Repatha and attempted to inject it into her stomach, but it accidentally went into her thumb based on how she was holding it. She is unsure how much medicine got injected, but saw some return of blood so knows the area was pricked. She did not feel any splash of medicine. Daughter got her a wet cool cloth to wrap around it. It does feel a little strange to the touch and is sore but no acute swelling and otherwise feels fine. I discussed with on call pharmacist at the hospital. There is no specific evidence to suggest danger of tissue loss in this area or significant difference in [place] vs IM, but we do not know how much medicine made it in. Per our discussion the patient was advised to monitor with supportive care and light icing, Tylenol PRN for pain, with return/ER precautions should she begin to develop worsening symptoms.  Will route to our office pharmD team to review and f/u with patient in AM to ensure doing better, and to help advise on next steps for dosing - whether she needs a catch up dose or we consider this her dose for the next 14 days. Sent to all as I was not sure who would be in office in AM. Greatly appreciate their help! Patient verbalized understanding and gratitude.  Charlie Pitter, PA-C

## 2023-01-21 MED ORDER — REPATHA SURECLICK 140 MG/ML ~~LOC~~ SOAJ: SUBCUTANEOUS | 11 refills | Status: DC

## 2023-01-21 NOTE — Telephone Encounter (Signed)
Insurance won't cover refill 2 weeks early so would count this as her dose and take her next dose as instructed in another 2 weeks. Pt aware and appreciative for the call. Thumb doing better this AM, just has a slight bruise.

## 2023-02-10 ENCOUNTER — Ambulatory Visit: Payer: 59 | Attending: Cardiovascular Disease | Admitting: Cardiovascular Disease

## 2023-02-10 ENCOUNTER — Encounter: Payer: Self-pay | Admitting: Cardiovascular Disease

## 2023-02-10 VITALS — BP 118/82 | HR 71 | Ht 63.0 in | Wt 169.0 lb

## 2023-02-10 DIAGNOSIS — Z951 Presence of aortocoronary bypass graft: Secondary | ICD-10-CM

## 2023-02-10 DIAGNOSIS — E785 Hyperlipidemia, unspecified: Secondary | ICD-10-CM | POA: Diagnosis not present

## 2023-02-10 DIAGNOSIS — I10 Essential (primary) hypertension: Secondary | ICD-10-CM

## 2023-02-10 NOTE — Assessment & Plan Note (Signed)
History of CAD status post non-STEMI with catheterization by Dr. Swaziland revealing three-vessel disease, normal LV function.  She had bypass grafting the following day by Dr. Dorris Fetch with a RIMA to her PDA, vein to a second diagonal branch and sequential radial to OM1 into.  Her postoperative course was unremarkable.  Because of chest pain and a Myoview stress test performed 07/20/2018 that showed transient ischemic dilatation I performed cardiac catheterization on her 07/25/2018 revealing widely patent grafts.  She does get occasional atypical chest heaviness which she attributes to stress.

## 2023-02-10 NOTE — Progress Notes (Signed)
02/10/2023 [name] [name]   [birth year]  [id number]  Primary Physician Renford Dills, MD Primary Cardiologist: Runell Gess MD Nicholes Calamity, [place]  HPI:  [name] [name] is a 57 y.o. moderately overweight married African-American female mother of one 21 year old daughter who I last saw in the office 02/03/22.. I met during her hospitalization on 07/29/17 when she was admitted with a non-STEMI. She required recatheterization that day by Dr. [place] revealing three-vessel disease with normal LV function and bypass grafting the following day by Dr. Dorris Fetch is a RIMA to the PDA, vein to the second diagonal branch and sequential radial to OM 1 and 2. Her postoperative course is uncooperative. She was discharged on 08/06/17.  To briefly participate in cardiac rehab which was prematurely terminated because of hypertension issues.   Because of left arm pain she underwent Myoview stress testing 07/20/2018 which revealed transient ischemic dilatation which could have been an anginal equivalent.  Based on this I performed cardiac catheterization on her as an outpatient 07/25/2018 revealing widely patent grafts.  Her most recent lipid profile on statin therapy performed 02/22/2019 revealed total cholesterol 161, LDL of 96 and HDL 36.   Since I saw her a year ago she continues to do well.  She was seeing Dr. Sharee Holster for weight loss, although her weight has not changed over the last year.  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  She is under a lot of stress at work because of manpower issues and has come describe some atypical chest heaviness.   Current Meds  Medication Sig   amLODipine (NORVASC) 10 MG tablet TAKE 1 TABLET BY MOUTH EVERY DAY   atorvastatin (LIPITOR) 20 MG tablet TAKE 1 TABLET BY MOUTH EVERY DAY   Evolocumab (REPATHA SURECLICK) 140 MG/ML SOAJ INJECT  ( 1 PEN) INTO THE SKIN EVERY 14 DAYS   metFORMIN (GLUCOPHAGE-XR) 500 MG 24 hr tablet  Take 1,000 mg by mouth 2 (two) times daily.   metoprolol tartrate (LOPRESSOR) 100 MG tablet TAKE 1 TABLET BY MOUTH TWICE A DAY   tirzepatide (MOUNJARO) 10 MG/0.5ML Pen Inject 10 mg into the skin once a week.   tirzepatide (MOUNJARO) 5 MG/0.5ML Pen Inject 5 mg weekly   valsartan (DIOVAN) 160 MG tablet TAKE 1 TABLET BY MOUTH EVERYDAY AT BEDTIME     Allergies  Allergen Reactions   Penicillins Hives    Has patient had a PCN reaction causing immediate rash, facial/tongue/throat swelling, SOB or lightheadedness with hypotension: unkn Has patient had a PCN reaction causing severe rash involving mucus membranes or skin necrosis: unkn Has patient had a PCN reaction that required hospitalization: no Has patient had a PCN reaction occurring within the last 10 years: no If all of the above answers are "NO", then may proceed with Cephalosporin use.     Social History   Socioeconomic History   Marital status: Married    Spouse name: Rosalina Dingwall   Number of children: 1   Years of education: Not on file   Highest education level: Not on file  Occupational History   Occupation: Health visitor  Tobacco Use   Smoking status: Never   Smokeless tobacco: Never  Vaping Use   Vaping Use: Never used  Substance and Sexual Activity   Alcohol use: Yes   Drug use: No   Sexual activity: Not on file  Other Topics Concern   Not on file  Social History Narrative  Not on file   Social Determinants of Health   Financial Resource Strain: Not on file  Food Insecurity: Not on file  Transportation Needs: Not on file  Physical Activity: Insufficiently Active (09/02/2017)   Exercise Vital Sign    Days of Exercise per Week: 4 days    Minutes of Exercise per Session: 30 min  Stress: Stress Concern Present (09/02/2017)   [employer] of Occupational Health - Occupational Stress Questionnaire    Feeling of Stress : Rather much  Social Connections: Not on file  Intimate Partner Violence:  Not on file     Review of Systems: General: negative for chills, fever, night sweats or weight changes.  Cardiovascular: negative for chest pain, dyspnea on exertion, edema, orthopnea, palpitations, paroxysmal nocturnal dyspnea or shortness of breath Dermatological: negative for rash Respiratory: negative for cough or wheezing Urologic: negative for hematuria Abdominal: negative for nausea, vomiting, diarrhea, bright red blood per rectum, melena, or hematemesis Neurologic: negative for visual changes, syncope, or dizziness All other systems reviewed and are otherwise negative except as noted above.    Blood pressure 118/82, pulse 71, height  (1.6 m), weight 169 lb (76.7 kg).  General appearance: alert and no distress Neck: no adenopathy, no carotid bruit, no JVD, supple, symmetrical, trachea midline, and thyroid not enlarged, symmetric, no tenderness/mass/nodules Lungs: clear to auscultation bilaterally Heart: regular rate and rhythm, S1, S2 normal, no murmur, click, rub or gallop Extremities: extremities normal, atraumatic, no cyanosis or edema Pulses: 2+ and symmetric Skin: Skin color, texture, turgor normal. No rashes or lesions Neurologic: Grossly normal  EKG sinus rhythm at 71 with borderline voltage evidence for LVH.  I personally reviewed this EKG.  ASSESSMENT AND PLAN:   Dyslipidemia History of hyperlipidemia on Repatha and atorvastatin with lipid profile performed 07/08/2022 revealing total cholesterol 120, LDL 63 and HDL of 39, at goal for secondary prevention.  We will recheck a fasting lipid lipid profile in 6 months.  Essential hypertension History of essential hypertension blood pressure measured today 118/82.  She is on amlodipine, valsartan, and metoprolol.  Hx of CABG History of CAD status post non-STEMI with catheterization by Dr. Swaziland revealing three-vessel disease, normal LV function.  She had bypass grafting the following day by Dr. Dorris Fetch with a  RIMA to her PDA, vein to a second diagonal branch and sequential radial to OM1 into.  Her postoperative course was unremarkable.  Because of chest pain and a Myoview stress test performed 07/20/2018 that showed transient ischemic dilatation I performed cardiac catheterization on her 07/25/2018 revealing widely patent grafts.  She does get occasional atypical chest heaviness which she attributes to stress.     Runell Gess MD FACP,FACC,FAHA, [facility] 02/10/2023 11:08 AM

## 2023-02-10 NOTE — Assessment & Plan Note (Signed)
History of hyperlipidemia on Repatha and atorvastatin with lipid profile performed 07/08/2022 revealing total cholesterol 120, LDL 63 and HDL of 39, at goal for secondary prevention.  We will recheck a fasting lipid lipid profile in 6 months.

## 2023-02-10 NOTE — Assessment & Plan Note (Signed)
History of essential hypertension blood pressure measured today 118/82.  She is on amlodipine, valsartan, and metoprolol.

## 2023-02-10 NOTE — Patient Instructions (Addendum)
Medication Instructions:  Your physician recommends that you continue on your current medications as directed. Please refer to the Current Medication list given to you today.  *If you need a refill on your cardiac medications before your next appointment, please call your pharmacy*   Lab Work: Your physician recommends that you return for lab work in: 6 months for FASTING Lipid/liver panel.  If you have labs (blood work) drawn today and your tests are completely normal, you will receive your results only by: MyChart Message (if you have MyChart) OR A paper copy in the mail If you have any lab test that is abnormal or we need to change your treatment, we will call you to review the results.   Follow-Up: At [facility], you and your health needs are our priority.  As part of our continuing mission to provide you with exceptional heart care, we have created designated Provider Care Teams.  These Care Teams include your primary Cardiologist (physician) and Advanced Practice Providers (APPs -  Physician Assistants and Nurse Practitioners) who all work together to provide you with the care you need, when you need it.  We recommend signing up for the patient portal called "MyChart".  Sign up information is provided on this After Visit Summary.  MyChart is used to connect with patients for Virtual Visits (Telemedicine).  Patients are able to view lab/test results, encounter notes, upcoming appointments, etc.  Non-urgent messages can be sent to your provider as well.   To learn more about what you can do with MyChart, go to [email or web address].    Your next appointment:   12 month(s)  Provider:   Nanetta Batty, MD

## 2023-02-16 ENCOUNTER — Other Ambulatory Visit: Payer: Self-pay | Admitting: Endocrinology

## 2023-02-16 ENCOUNTER — Other Ambulatory Visit: Payer: Self-pay | Admitting: Cardiovascular Disease

## 2023-02-17 ENCOUNTER — Other Ambulatory Visit (HOSPITAL_COMMUNITY): Payer: Self-pay

## 2023-02-17 ENCOUNTER — Telehealth: Payer: Self-pay

## 2023-02-17 NOTE — Telephone Encounter (Signed)
Patient Advocate Encounter   Received notification from [facility] that prior authorization is required for [facility]  Submitted: 02/17/23 Key [id number]  PA was APPROVED Effective dates: 01/18/23 through 02/17/24

## 2023-05-18 ENCOUNTER — Other Ambulatory Visit: Payer: Self-pay | Admitting: Endocrinology

## 2023-05-21 ENCOUNTER — Other Ambulatory Visit (INDEPENDENT_AMBULATORY_CARE_PROVIDER_SITE_OTHER): Payer: 59

## 2023-05-21 DIAGNOSIS — E1165 Type 2 diabetes mellitus with hyperglycemia: Secondary | ICD-10-CM | POA: Diagnosis not present

## 2023-05-21 LAB — BASIC METABOLIC PANEL
BUN: 13 mg/dL (ref 6–23)
CO2: 28 mEq/L (ref 19–32)
Calcium: 11 mg/dL — ABNORMAL HIGH (ref 8.4–10.5)
Chloride: 104 mEq/L (ref 96–112)
Creatinine, Ser: 0.74 mg/dL (ref 0.40–1.20)
GFR: 89.69 mL/min (ref >60.00)
Glucose, Bld: 125 mg/dL — ABNORMAL HIGH (ref 70–99)
Potassium: 4.3 mEq/L (ref 3.5–5.1)
Sodium: 139 mEq/L (ref 135–145)

## 2023-05-21 LAB — HEMOGLOBIN A1C: Hgb A1c MFr Bld: 6.7 % — ABNORMAL HIGH (ref 4.6–6.5)

## 2023-05-26 ENCOUNTER — Ambulatory Visit: Payer: 59 | Admitting: Endocrinology

## 2023-05-26 ENCOUNTER — Encounter: Payer: Self-pay | Admitting: Endocrinology

## 2023-05-26 VITALS — BP 126/84 | HR 88 | Ht 63.0 in | Wt 173.0 lb

## 2023-05-26 DIAGNOSIS — E559 Vitamin D deficiency, unspecified: Secondary | ICD-10-CM

## 2023-05-26 DIAGNOSIS — E669 Obesity, unspecified: Secondary | ICD-10-CM | POA: Diagnosis not present

## 2023-05-26 DIAGNOSIS — E1169 Type 2 diabetes mellitus with other specified complication: Secondary | ICD-10-CM

## 2023-05-26 DIAGNOSIS — Z7985 Long-term (current) use of injectable non-insulin antidiabetic drugs: Secondary | ICD-10-CM

## 2023-05-26 DIAGNOSIS — E213 Hyperparathyroidism, unspecified: Secondary | ICD-10-CM

## 2023-05-26 MED ORDER — TIRZEPATIDE 15 MG/0.5ML ~~LOC~~ SOAJ: 15.0000 mg | SUBCUTANEOUS | 3 refills | Status: DC

## 2023-05-26 NOTE — Progress Notes (Signed)
Patient ID: [name] [name], female   DOB: [birth year], 57 y.o.   MRN: [id number]              Chief complaint: Endocrinology follow-up   History of Present Illness:  Problem 1: DIABETES type II  She was initially diagnosed in 2017   Medication regimen: Metformin ER 1500 mg daily, Mounjaro 10 mg weekly  Current management, level of control, blood sugar readings, problems identified:  A1c is further improved at 6.7 compared to 7.0  She is using the Livongo monitor from her insurance with details below Unable to get the reports from this meter today However her blood sugars still are excellent and consistently controlled Given her readings are either fasting or before dinner and only a few readings after breakfast Lab blood sugar 125 Her activity level has been consistent but recently trying to walk about 15 minutes at work She has been able to continue 10 mg dose weekly but is not losing any weight May have gained back some weight on vacation lately   Last consultation with dietitian: 7/21  Current monitor being used: Livongo  Blood sugar data recent average 133 for 30 days through August 7  RANGE recently 113-1 62 with most readings in the mornings or after breakfast and rarely in the evenings   Wt Readings from Last 3 Encounters:  05/26/23 173 lb (78.5 kg)  02/10/23 169 lb (76.7 kg)  01/19/23 172 lb (78 kg)    Lab Results  Component Value Date   HGBA1C 6.7 (H) 05/21/2023   HGBA1C 7.0 (H) 01/13/2023   HGBA1C 7.1 (H) 06/05/2022   Lab Results  Component Value Date   MICROALBUR 1.8 09/02/2021   LDLCALC 56 02/03/2022   CREATININE 0.74 05/21/2023    HYPERCALCEMIA:  Review of records show that she had a high calcium of 11.2 done in October 2017 The patient thinks that she has had higher calcium before also but no records are available Apparently at some point she was taking HCTZ and this was stopped with some improvement in calcium  PTH level from unknown  lab 29 done in 10/17 and subsequently 32 in 8/19  RECENT history: Her serum calcium has been persistently high and ranging between 10.5 -11.4 since 12/18 It is now about the same at 11  Has been asymptomatic as before with no history of fractures or kidney stones No osteoporosis on previous bone density from 09/2017  Lab Results  Component Value Date   CALCIUM 11.0 (H) 05/21/2023   CALCIUM 10.9 (H) 01/13/2023   CALCIUM 11.2 (H) 06/05/2022   CALCIUM 11.3 (H) 12/16/2021   CALCIUM 10.8 (H) 09/02/2021   CALCIUM 10.9 (H) 04/08/2021   CALCIUM 10.8 (H) 11/19/2020   CALCIUM 11.4 (H) 08/15/2020   CALCIUM 11.0 (H) 04/23/2020   Lab Results  Component Value Date   PTH 32 05/23/2018   CALCIUM 11.0 (H) 05/21/2023   CAION 1.31 07/31/2017   PHOS 3.2 10/01/2017       VITAMIN D deficiency:  She has been on vitamin D supplements for persistently low levels below 20 since at least 2018  She is taking vitamin D3, 4000 units  Vitamin D level is normal this year  Lab Results  Component Value Date   VD25OH 30.89 01/13/2023   VD25OH 25.52 (L) 06/05/2022   VD25OH 41.83 09/02/2021   VD25OH 48.5 11/19/2020   VD25OH 29.29 (L) 08/15/2020        Allergies as of 05/26/2023  Reactions   Penicillins Hives   Has patient had a PCN reaction causing immediate rash, facial/tongue/throat swelling, SOB or lightheadedness with hypotension: unkn Has patient had a PCN reaction causing severe rash involving mucus membranes or skin necrosis: unkn Has patient had a PCN reaction that required hospitalization: no Has patient had a PCN reaction occurring within the last 10 years: no If all of the above answers are "NO", then may proceed with Cephalosporin use.        Medication List        Accurate as of May 26, 2023  9:45 AM. If you have any questions, ask your nurse or doctor.          amLODipine 10 MG tablet Commonly known as: NORVASC TAKE 1 TABLET BY MOUTH EVERY DAY   atorvastatin  20 MG tablet Commonly known as: LIPITOR TAKE 1 TABLET BY MOUTH EVERY DAY   metFORMIN 500 MG 24 hr tablet Commonly known as: GLUCOPHAGE-XR Take 1,000 mg by mouth 2 (two) times daily.   metoprolol tartrate 100 MG tablet Commonly known as: LOPRESSOR TAKE 1 TABLET BY MOUTH TWICE A DAY   Mounjaro 5 MG/0.5ML Pen Generic drug: tirzepatide Inject 5 mg weekly What changed:  how much to take Another medication with the same name was removed. Continue taking this medication, and follow the directions you see here.   Repatha SureClick 140 MG/ML Soaj Generic drug: Evolocumab INJECT 140MG  ( 1 PEN) INTO THE SKIN EVERY 14 DAYS   valsartan 160 MG tablet Commonly known as: DIOVAN TAKE 1 TABLET BY MOUTH EVERYDAY AT BEDTIME        Allergies:  Allergies  Allergen Reactions   Penicillins Hives    Has patient had a PCN reaction causing immediate rash, facial/tongue/throat swelling, SOB or lightheadedness with hypotension: unkn Has patient had a PCN reaction causing severe rash involving mucus membranes or skin necrosis: unkn Has patient had a PCN reaction that required hospitalization: no Has patient had a PCN reaction occurring within the last 10 years: no If all of the above answers are "NO", then may proceed with Cephalosporin use.     Past Medical History:  Diagnosis Date   Anxiety    Constipation    Coronary artery disease    Diabetes mellitus without complication (HCC)    Heart disease    History of heart attack    Hyperlipidemia    Hypertension    Obesity    SOBOE (shortness of breath on exertion)    Swelling of both lower extremities    Trouble in sleeping    Vitamin D deficiency     Past Surgical History:  Procedure Laterality Date   CARDIAC CATHETERIZATION     CESAREAN SECTION  12/13/1998   CORONARY ARTERY BYPASS GRAFT N/A 07/30/2017   Procedure: CORONARY ARTERY BYPASS GRAFTING (CABG)  x four, using right internal mammary artery, right greater saphenous vein, left  radial artery;  Surgeon: Loreli Slot, MD;  Location: [facility] OR;  Service: Open Heart Surgery;  Laterality: N/A;   INTRAOPERATIVE TRANSESOPHAGEAL ECHOCARDIOGRAM N/A 07/30/2017   Procedure: INTRAOPERATIVE TRANSESOPHAGEAL ECHOCARDIOGRAM;  Surgeon: Loreli Slot, MD;  Location: [facility] OR;  Service: Open Heart Surgery;  Laterality: N/A;   LEFT HEART CATH AND CORONARY ANGIOGRAPHY N/A 07/29/2017   Procedure: LEFT HEART CATH AND CORONARY ANGIOGRAPHY;  Surgeon: Swaziland, Peter M, MD;  Location: [facility] INVASIVE CV LAB;  Service: Cardiovascular;  Laterality: N/A;   LEFT HEART CATH AND CORS/GRAFTS ANGIOGRAPHY N/A 07/25/2018   Procedure:  LEFT HEART CATH AND CORS/GRAFTS ANGIOGRAPHY;  Surgeon: Runell Gess, MD;  Location: [facility] INVASIVE CV LAB;  Service: Cardiovascular;  Laterality: N/A;   MYOMECTOMY  09/1994   PARTIAL HYSTERECTOMY  2005   RADIAL ARTERY HARVEST Left 07/30/2017   Procedure: RADIAL ARTERY HARVEST;  Surgeon: Loreli Slot, MD;  Location: [facility] OR;  Service: Open Heart Surgery;  Laterality: Left;    Family History  Problem Relation Age of Onset   Hypertension Other    Hypertension Mother    Hyperlipidemia Mother    Obesity Mother    Diabetes Father    Hypertension Father    Hyperlipidemia Father     Social History:  reports that she has never smoked. She has never used smokeless tobacco. She reports current alcohol use. She reports that she does not use drugs.  Review of Systems  HYPERCHOLESTEROLEMIA: Followed by PCP/cardiologist   She does have a history of CAD and she is on Repatha also along with a dose of Lipitor 20 mg.   Lab Results  Component Value Date   CHOL 123 02/03/2022   CHOL 95 (L) 11/19/2020   CHOL 161 02/21/2020   Lab Results  Component Value Date   HDL 41 02/03/2022   HDL 38 (L) 11/19/2020   HDL 36 (L) 02/21/2020   Lab Results  Component Value Date   LDLCALC 56 02/03/2022   LDLCALC 35 11/19/2020   LDLCALC 104 (H) 02/21/2020   Lab Results   Component Value Date   TRIG 152 (H) 02/03/2022   TRIG 120 11/19/2020   TRIG 113 02/21/2020   Lab Results  Component Value Date   CHOLHDL 3.0 02/03/2022   CHOLHDL 2.5 11/19/2020   CHOLHDL 4.5 (H) 02/21/2020   No results found for: "LDLDIRECT"   HYPERTENSION: This is being followed by her PCP  Also monitors at home  BP Readings from Last 3 Encounters:  05/26/23 126/84  02/10/23 118/82  01/19/23 112/64      EXAM:  BP 126/84   Pulse 88   Ht 5\' 3"  (1.6 m)   Wt 173 lb (78.5 kg)   SpO2 98%   BMI 30.65 kg/m   Diabetic Foot Exam - Simple   Simple Foot Form Diabetic Foot exam was performed with the following findings: Yes   Visual Inspection No deformities, no ulcerations, no other skin breakdown bilaterally: Yes Sensation Testing Intact to touch and monofilament testing bilaterally: Yes Pulse Check Posterior Tibialis and Dorsalis pulse intact bilaterally: Yes Comments      Assessment/Plan:   DIABETES type 2, non-insulin-dependent:  Her A1c is about the same at 7  She is currently taking Mounjaro 5 mg weekly and Metformin ER 1500 mg daily  Her blood sugars are being checked infrequently but at different times of the day  Has not done enough readings after her main meal in the evening but generally postprandial readings are not high She is tolerating Mounjaro but is only able to get 5 mg instead of the 10 mg recommended previously Currently not losing weight Overall blood sugars are fairly good with recent average about 137 Also recently starting to do some walking for exercise   Recommendations:  Increase regular exercise She we will try to go up on the dose of Mounjaro to see if this will help her with better weight loss which would benefit her long-term, written prescription given for her to fill this on the next visit to the pharmacy Discussed blood sugar targets both fasting and after  meals She will try to moderate portions of  carbohydrates  HYPERCALCEMIA:  Has asymptomatic hyperparathyroidism Calcium now stable at 11 She will be on observation only for now   History of vitamin D deficiency: Will recheck labs in the next visit    Reather Littler 05/26/2023, 9:45 AM     Note: This office note was prepared with Dragon voice recognition system technology. Any transcriptional errors that result from this process are unintentional.

## 2023-06-18 ENCOUNTER — Other Ambulatory Visit: Payer: Self-pay | Admitting: Cardiovascular Disease

## 2023-08-11 ENCOUNTER — Other Ambulatory Visit: Payer: Self-pay | Admitting: Internal Medicine

## 2023-08-11 DIAGNOSIS — Z1231 Encounter for screening mammogram for malignant neoplasm of breast: Secondary | ICD-10-CM

## 2023-08-16 ENCOUNTER — Other Ambulatory Visit: Payer: Self-pay

## 2023-08-16 ENCOUNTER — Telehealth: Payer: Self-pay

## 2023-08-16 DIAGNOSIS — E1169 Type 2 diabetes mellitus with other specified complication: Secondary | ICD-10-CM

## 2023-08-16 DIAGNOSIS — E1165 Type 2 diabetes mellitus with hyperglycemia: Secondary | ICD-10-CM

## 2023-08-16 DIAGNOSIS — E213 Hyperparathyroidism, unspecified: Secondary | ICD-10-CM

## 2023-08-16 DIAGNOSIS — E559 Vitamin D deficiency, unspecified: Secondary | ICD-10-CM

## 2023-08-16 DIAGNOSIS — E119 Type 2 diabetes mellitus without complications: Secondary | ICD-10-CM

## 2023-08-16 MED ORDER — TIRZEPATIDE 15 MG/0.5ML ~~LOC~~ SOAJ: 15.0000 mg | SUBCUTANEOUS | 3 refills | Status: DC

## 2023-08-16 NOTE — Telephone Encounter (Signed)
Mounjaro refill request complete

## 2023-08-19 ENCOUNTER — Other Ambulatory Visit: Payer: Self-pay

## 2023-08-19 DIAGNOSIS — E119 Type 2 diabetes mellitus without complications: Secondary | ICD-10-CM

## 2023-08-19 MED ORDER — TIRZEPATIDE 15 MG/0.5ML ~~LOC~~ SOAJ: 15.0000 mg | SUBCUTANEOUS | 3 refills | Status: DC

## 2023-08-20 ENCOUNTER — Other Ambulatory Visit (INDEPENDENT_AMBULATORY_CARE_PROVIDER_SITE_OTHER): Payer: 59

## 2023-08-20 DIAGNOSIS — E1169 Type 2 diabetes mellitus with other specified complication: Secondary | ICD-10-CM

## 2023-08-20 DIAGNOSIS — E669 Obesity, unspecified: Secondary | ICD-10-CM | POA: Diagnosis not present

## 2023-08-20 DIAGNOSIS — E559 Vitamin D deficiency, unspecified: Secondary | ICD-10-CM

## 2023-08-20 DIAGNOSIS — E213 Hyperparathyroidism, unspecified: Secondary | ICD-10-CM

## 2023-08-20 DIAGNOSIS — E1165 Type 2 diabetes mellitus with hyperglycemia: Secondary | ICD-10-CM | POA: Diagnosis not present

## 2023-08-20 LAB — COMPREHENSIVE METABOLIC PANEL
ALT: 26 U/L (ref 0–35)
AST: 23 U/L (ref 0–37)
Albumin: 4.4 g/dL (ref 3.5–5.2)
Alkaline Phosphatase: 54 U/L (ref 39–117)
BUN: 15 mg/dL (ref 6–23)
CO2: 28 meq/L (ref 19–32)
Calcium: 11 mg/dL — ABNORMAL HIGH (ref 8.4–10.5)
Chloride: 102 meq/L (ref 96–112)
Creatinine, Ser: 0.82 mg/dL (ref 0.40–1.20)
GFR: 79.16 mL/min (ref >60.00)
Glucose, Bld: 160 mg/dL — ABNORMAL HIGH (ref 70–99)
Potassium: 4.3 meq/L (ref 3.5–5.1)
Sodium: 138 meq/L (ref 135–145)
Total Bilirubin: 0.5 mg/dL (ref 0.2–1.2)
Total Protein: 8.2 g/dL (ref 6.0–8.3)

## 2023-08-21 LAB — HEMOGLOBIN A1C
Hgb A1c MFr Bld: 7.4 %{Hb} — ABNORMAL HIGH (ref <5.7)
Mean Plasma Glucose: 166 mg/dL
eAG (mmol/L): 9.2 mmol/L

## 2023-08-21 LAB — VITAMIN D 25 HYDROXY (VIT D DEFICIENCY, FRACTURES): Vit D, 25-Hydroxy: 28 ng/mL — ABNORMAL LOW (ref 30–100)

## 2023-08-23 ENCOUNTER — Other Ambulatory Visit (HOSPITAL_COMMUNITY): Payer: Self-pay

## 2023-08-23 ENCOUNTER — Telehealth: Payer: Self-pay | Admitting: Pharmacy Technician

## 2023-08-23 NOTE — Telephone Encounter (Signed)
Pharmacy Patient Advocate Encounter  Insurance verification completed.    The patient is insured through [employer] test claim for repatha. Currently a quantity of 2 ml is a 28 day supply and the co-pay is 35.00 .Drug does not require prior auth- it is a current benefit on the plan   This test claim was processed through [facility]- copay amounts may vary at other pharmacies due to pharmacy/plan contracts, or as the patient moves through the different stages of their insurance plan.

## 2023-08-27 ENCOUNTER — Encounter: Payer: Self-pay | Admitting: Endocrinology

## 2023-08-27 ENCOUNTER — Ambulatory Visit: Payer: 59 | Admitting: Endocrinology

## 2023-08-27 ENCOUNTER — Other Ambulatory Visit: Payer: Self-pay | Admitting: Endocrinology

## 2023-08-27 VITALS — BP 112/78 | HR 74 | Resp 16 | Ht 63.0 in | Wt 173.4 lb

## 2023-08-27 DIAGNOSIS — Z7984 Long term (current) use of oral hypoglycemic drugs: Secondary | ICD-10-CM | POA: Diagnosis not present

## 2023-08-27 DIAGNOSIS — E1165 Type 2 diabetes mellitus with hyperglycemia: Secondary | ICD-10-CM

## 2023-08-27 DIAGNOSIS — Z7985 Long-term (current) use of injectable non-insulin antidiabetic drugs: Secondary | ICD-10-CM

## 2023-08-27 LAB — MAGNESIUM: Magnesium: 1.8 mg/dL (ref 1.5–2.5)

## 2023-08-27 LAB — RENAL FUNCTION PANEL
Albumin: 4.3 g/dL (ref 3.5–5.2)
BUN: 12 mg/dL (ref 6–23)
CO2: 29 meq/L (ref 19–32)
Calcium: 10.8 mg/dL — ABNORMAL HIGH (ref 8.4–10.5)
Chloride: 103 meq/L (ref 96–112)
Creatinine, Ser: 0.82 mg/dL (ref 0.40–1.20)
GFR: 79.15 mL/min (ref >60.00)
Glucose, Bld: 146 mg/dL — ABNORMAL HIGH (ref 70–99)
Phosphorus: 2.9 mg/dL (ref 2.3–4.6)
Potassium: 4.1 meq/L (ref 3.5–5.1)
Sodium: 138 meq/L (ref 135–145)

## 2023-08-27 NOTE — Progress Notes (Signed)
Outpatient Endocrinology Note [name], MD  08/27/23  Patient's Name: [name] [name]    DOB: 06/01/1966    MRN: [id number]                                                    REASON OF VISIT: Follow up of type 2 diabetes mellitus  PCP: Renford Dills, MD  HISTORY OF PRESENT ILLNESS:   [name] [name] is a 57 y.o. old female with past medical history listed below, is here for follow up for type 2 diabetes mellitus and hypercalcemia.   Pertinent Diabetes History: Patient was diagnosed with type 2 diabetes mellitus in 2017.  Chronic Diabetes Complications : Retinopathy: no. Last ophthalmology exam was done on annually 04/2023, following with ophthalmology regularly.  Nephropathy: no, on ACE/ARB / valsartan Peripheral neuropathy: no Coronary artery disease: CAD s/p CABG 2018. Stroke: no  Relevant comorbidities and cardiovascular risk factors: Obesity: yes Body mass index is 30.72 kg/m.  Hypertension: Yes  Hyperlipidemia : Yes, on statin / repatha  Current / Home Diabetic regimen includes: Mounjaro 15 mg weekly.  Metformin ER 500 mg three times a day.   Prior diabetic medications: Metoformin diarrhea on higher dose.   Glycemic data:   She forgot to bring glucometer into clinic today.  She uses Livongo glucometer.  Hypoglycemia: Patient has denies hypoglycemic episodes. Patient has hypoglycemia awareness.  Factors modifying glucose control: 1.  Diabetic diet assessment: 3 meals a day.  2.  Staying active or exercising:   3.  Medication compliance: compliant all of the time.  # Hypercalcemia Review of records show that she had a high calcium of 11.2 done in October 2017. The patient thinks that she has had higher calcium before also but no records are available. Apparently at some point she was taking HCTZ and this was stopped with some improvement in calcium. PTH level from unknown lab 29 done in 10/17 and subsequently 32 in 8/19, normal.   - Her serum calcium has  been persistently high and ranging between 10.5 -11.4 since 12/18.  Being monitored conservatively.  No history of fragility fracture.  No kidney stone. No osteoporosis on previous bone density from 09/2017.  # VITAMIN D deficiency:  - She is taking vitamin D3, 4000 units.    Interval history  Patient has been taking Mounjaro 15 mg, increase 1 week ago.  She is tolerating well.  No nausea or vomiting.  Patient complains of muscle weakness and nocturia multiple times at night, has fatigue and also has significant GERD symptoms.  Recent labs reviewed with serum calcium 11, albumin 4.4 corrected serum calcium of 10.7.  Vitamin D level 28.  Acceptable renal function EGFR 79.  Hemoglobin A1c 7.4%.  REVIEW OF SYSTEMS As per history of present illness.   PAST MEDICAL HISTORY: Past Medical History:  Diagnosis Date   Anxiety    Constipation    Coronary artery disease    Diabetes mellitus without complication (HCC)    Heart disease    History of heart attack    Hyperlipidemia    Hypertension    Obesity    SOBOE (shortness of breath on exertion)    Swelling of both lower extremities    Trouble in sleeping    Vitamin D deficiency     PAST SURGICAL HISTORY: Past Surgical History:  Procedure Laterality Date   CARDIAC CATHETERIZATION     CESAREAN SECTION  12/13/1998   CORONARY ARTERY BYPASS GRAFT N/A 07/30/2017   Procedure: CORONARY ARTERY BYPASS GRAFTING (CABG)  x four, using right internal mammary artery, right greater saphenous vein, left radial artery;  Surgeon: Loreli Slot, MD;  Location: [facility] OR;  Service: Open Heart Surgery;  Laterality: N/A;   INTRAOPERATIVE TRANSESOPHAGEAL ECHOCARDIOGRAM N/A 07/30/2017   Procedure: INTRAOPERATIVE TRANSESOPHAGEAL ECHOCARDIOGRAM;  Surgeon: Loreli Slot, MD;  Location: [facility] OR;  Service: Open Heart Surgery;  Laterality: N/A;   LEFT HEART CATH AND CORONARY ANGIOGRAPHY N/A 07/29/2017   Procedure: LEFT HEART CATH AND CORONARY  ANGIOGRAPHY;  Surgeon: Swaziland, Peter M, MD;  Location: [facility] INVASIVE CV LAB;  Service: Cardiovascular;  Laterality: N/A;   LEFT HEART CATH AND CORS/GRAFTS ANGIOGRAPHY N/A 07/25/2018   Procedure: LEFT HEART CATH AND CORS/GRAFTS ANGIOGRAPHY;  Surgeon: Runell Gess, MD;  Location: [facility] INVASIVE CV LAB;  Service: Cardiovascular;  Laterality: N/A;   MYOMECTOMY  09/1994   PARTIAL HYSTERECTOMY  2005   RADIAL ARTERY HARVEST Left 07/30/2017   Procedure: RADIAL ARTERY HARVEST;  Surgeon: Loreli Slot, MD;  Location: [facility] OR;  Service: Open Heart Surgery;  Laterality: Left;    ALLERGIES: Allergies  Allergen Reactions   Penicillins Hives    Has patient had a PCN reaction causing immediate rash, facial/tongue/throat swelling, SOB or lightheadedness with hypotension: unkn Has patient had a PCN reaction causing severe rash involving mucus membranes or skin necrosis: unkn Has patient had a PCN reaction that required hospitalization: no Has patient had a PCN reaction occurring within the last 10 years: no If all of the above answers are "NO", then may proceed with Cephalosporin use.     FAMILY HISTORY:  Family History  Problem Relation Age of Onset   Hypertension Other    Hypertension Mother    Hyperlipidemia Mother    Obesity Mother    Diabetes Father    Hypertension Father    Hyperlipidemia Father     SOCIAL HISTORY: Social History   Socioeconomic History   Marital status: Married    Spouse name: Latressa Bange   Number of children: 1   Years of education: Not on file   Highest education level: Not on file  Occupational History   Occupation: Health visitor  Tobacco Use   Smoking status: Never   Smokeless tobacco: Never  Vaping Use   Vaping status: Never Used  Substance and Sexual Activity   Alcohol use: Yes   Drug use: No   Sexual activity: Not on file  Other Topics Concern   Not on file  Social History Narrative   Not on file   Social Determinants of Health    Financial Resource Strain: Not on file  Food Insecurity: Not on file  Transportation Needs: Not on file  Physical Activity: Insufficiently Active (09/02/2017)   Exercise Vital Sign    Days of Exercise per Week: 4 days    Minutes of Exercise per Session: 30 min  Stress: Stress Concern Present (09/02/2017)   [employer] of Occupational Health - Occupational Stress Questionnaire    Feeling of Stress : Rather much  Social Connections: Not on file    MEDICATIONS:  Current Outpatient Medications  Medication Sig Dispense Refill   amLODipine (NORVASC) 10 MG tablet TAKE 1 TABLET BY MOUTH EVERY DAY 30 tablet 11   atorvastatin (LIPITOR) 20 MG tablet TAKE 1 TABLET BY MOUTH EVERY DAY 30 tablet 11  Evolocumab (REPATHA SURECLICK) 140 MG/ML SOAJ INJECT 140MG  ( 1 PEN) INTO THE SKIN EVERY 14 DAYS 2 mL 11   metFORMIN (GLUCOPHAGE-XR) 500 MG 24 hr tablet Take 1,000 mg by mouth 2 (two) times daily.     metoprolol tartrate (LOPRESSOR) 100 MG tablet Take 1 tablet (100 mg total) by mouth 2 (two) times daily. Please call office to schedule an appt for further refills. Thank you 180 tablet 0   tirzepatide (MOUNJARO) 15 MG/0.5ML Pen Inject 15 mg into the skin once a week. 2 mL 3   valsartan (DIOVAN) 160 MG tablet TAKE 1 TABLET BY MOUTH EVERYDAY AT BEDTIME 30 tablet 11   No current facility-administered medications for this visit.    PHYSICAL EXAM: Vitals:   08/27/23 0943  BP: 112/78  Pulse: 74  Resp: 16  SpO2: 99%  Weight: 173 lb 6.4 oz (78.7 kg)  Height: 5\' 3"  (1.6 m)   Body mass index is 30.72 kg/m.  Wt Readings from Last 3 Encounters:  08/27/23 173 lb 6.4 oz (78.7 kg)  05/26/23 173 lb (78.5 kg)  02/10/23 169 lb (76.7 kg)    General: Well developed, well nourished female in no apparent distress.  HEENT: AT/[place], no external lesions.  Eyes: Conjunctiva clear and no icterus. Neck: Neck supple  Lungs: Respirations not labored Neurologic: Alert, oriented, normal speech Extremities /  Skin: Dry.  Psychiatric: Does not appear depressed or anxious  Diabetic Foot Exam - Simple   No data filed    LABS Reviewed Lab Results  Component Value Date   HGBA1C 7.4 (H) 08/20/2023   HGBA1C 6.7 (H) 05/21/2023   HGBA1C 7.0 (H) 01/13/2023   No results found for: "FRUCTOSAMINE" Lab Results  Component Value Date   CHOL 123 02/03/2022   HDL 41 02/03/2022   LDLCALC 56 02/03/2022   TRIG 152 (H) 02/03/2022   CHOLHDL 3.0 02/03/2022   Lab Results  Component Value Date   MICRALBCREAT 1.4 09/02/2021   MICRALBCREAT 2.0 08/15/2020   Lab Results  Component Value Date   CREATININE 0.82 08/27/2023   Lab Results  Component Value Date   GFR 79.15 08/27/2023    ASSESSMENT / PLAN  1. Uncontrolled type 2 diabetes mellitus with hyperglycemia, without long-term current use of insulin (HCC)   2. Hypercalcemia     Diabetes Mellitus type 2, complicated by CAD - Diabetic status / severity: Uncontrolled  Lab Results  Component Value Date   HGBA1C 7.4 (H) 08/20/2023    - Hemoglobin A1c goal : <7%  Diabetes control has been worsening however she recently has increased Mounjaro to 15 mg 1 week ago.  - Medications: No change.  I) continue Mounjaro 15 mg weekly. II) continue metformin extended release 500 mg with meals 3 times a day.  - Home glucose testing: In the morning fasting and at bedtime. - Discussed/ Gave Hypoglycemia treatment plan.  # Consult : not required at this time.   # Annual urine for microalbuminuria/ creatinine ratio, no microalbuminuria currently, continue ACE/ARB valsartan, will check lab in the next follow-up visit. Last  Lab Results  Component Value Date   MICRALBCREAT 1.4 09/02/2021    # Foot check nightly.  # Annual dilated diabetic eye exams.   - Diet: Make healthy diabetic food choices - Life style / activity / exercise: Discussed.  2. Blood pressure  -  BP Readings from Last 1 Encounters:  08/27/23 112/78    - Control is in  target.  - No change in current  plans.  3. Lipid status / Hyperlipidemia - Last  Lab Results  Component Value Date   LDLCALC 56 02/03/2022   - Continue atorvastatin 20 mg daily.  On Repatha as well.  Managed by cardiology.    # Hypercalcemia -Patient has persistently high calcium at least from 2018.  Being monitored conservatively. -She has complaints of muscle ache, worsening GERD and fatigue.  No history of kidney stone, fragility fracture or osteoporosis.  Last DEXA scan was in 2018. -I would like to investigate more for hypercalcemia. -Check renal function panel, PTH, magnesium, ionized calcium -Check 24-hour urine calcium and creatinine -Rest of the management plan based on these test results.  # Vitamin D deficiency -Recent vitamin D level acceptable 28.  Continue current vitamin D supplement.  Diagnoses and all orders for this visit:  Uncontrolled type 2 diabetes mellitus with hyperglycemia, without long-term current use of insulin (HCC) -     Cancel: Hemoglobin A1c; Future  Hypercalcemia -     Cancel: Renal function panel; Future -     Cancel: Parathyroid hormone, intact (no Ca); Future -     Renal function panel; Future -     Parathyroid hormone, intact (no Ca); Future -     Calcium, ionized; Future -     Calcium, urine, 24 hour; Future -     Creatinine, urine, 24 hour; Future -     Magnesium; Future -     Magnesium -     Calcium, ionized -     Parathyroid hormone, intact (no Ca) -     Renal function panel    DISPOSITION Follow up in clinic in 3  months suggested.   All questions answered and patient verbalized understanding of the plan.  [place] Carlissa Pesola, MD [facility] Endocrinology [facility] [address] [place], [address] [place], [place] [address] Phone # [phone]  At least part of this note was generated using voice recognition software. Inadvertent word errors may have occurred, which were not recognized during the proofreading process.

## 2023-08-27 NOTE — Patient Instructions (Addendum)
 Lab today.  24-hr Urine Collection: - Preferably to be done while staying at home and start in the morning.  - Discard the first urine of the morning after you wake up because that was the urine from overnight. DO NOT COLLECT FIRST URINE. Then begin 24-hour urine collection.  Collect your urine every time you pee for next 24 hours which means all day and overnight until next morning.  You collect the last urine next morning, that COMPLETES the collection.

## 2023-08-30 ENCOUNTER — Other Ambulatory Visit: Payer: 59

## 2023-08-30 LAB — CALCIUM, IONIZED: Calcium, Ion: 5.8 mg/dL — ABNORMAL HIGH (ref 4.7–5.5)

## 2023-08-30 LAB — PARATHYROID HORMONE, INTACT (NO CA)

## 2023-08-31 LAB — CREATININE, URINE, 24 HOUR: Creatinine, 24H Ur: 1.79 g/(24.h) (ref 0.50–2.15)

## 2023-08-31 LAB — CALCIUM, URINE, 24 HOUR: Calcium, 24H Urine: 214 mg/(24.h)

## 2023-09-01 ENCOUNTER — Telehealth: Payer: Self-pay

## 2023-09-01 LAB — SPECIMEN STATUS REPORT

## 2023-09-01 LAB — PARATHYROID HORMONE, INTACT (NO CA): PTH: 91 pg/mL — ABNORMAL HIGH (ref 15–65)

## 2023-09-01 NOTE — Telephone Encounter (Signed)
-----   Message from [place] Thapa sent at 09/01/2023  9:34 AM EST ----- Please notify patient of labs reviewed corrected serum calcium is 10.6, serum calcium 10.4 with albumin 4.3.  Normal phosphorus.  Stable renal function EGFR 79.  Magnesium normal.  Ionized calcium mildly elevated 5.8.  Vitamin D susceptible 28.  Parathyroid hormone is elevated at 91 with upper normal limit of 65.  24-hour urine calcium normal.  Overall consistent with mild primary hyperparathyroidism.  Other than symptoms there is no absolute indication for parathyroid surgery.   If patient wants to go for parathyroid surgery we need to plan for imaging test for parathyroid gland. We can discuss further in coming follow-up visit with the patient, regarding going for parathyroid surgery versus conservative monitoring.

## 2023-09-01 NOTE — Telephone Encounter (Signed)
VM left requesting callback to give patient results per MD

## 2023-09-02 ENCOUNTER — Telehealth: Payer: Self-pay

## 2023-09-02 NOTE — Telephone Encounter (Signed)
Patient results and medication changes given to patient as directed by MD; no further questions from patient at this time.

## 2023-09-02 NOTE — Telephone Encounter (Signed)
-----   Message from [place] Thapa sent at 09/01/2023  9:34 AM EST ----- Please notify patient of labs reviewed corrected serum calcium is 10.6, serum calcium 10.4 with albumin 4.3.  Normal phosphorus.  Stable renal function EGFR 79.  Magnesium normal.  Ionized calcium mildly elevated 5.8.  Vitamin D susceptible 28.  Parathyroid hormone is elevated at 91 with upper normal limit of 65.  24-hour urine calcium normal.  Overall consistent with mild primary hyperparathyroidism.  Other than symptoms there is no absolute indication for parathyroid surgery.   If patient wants to go for parathyroid surgery we need to plan for imaging test for parathyroid gland. We can discuss further in coming follow-up visit with the patient, regarding going for parathyroid surgery versus conservative monitoring.

## 2023-09-03 ENCOUNTER — Ambulatory Visit
Admission: RE | Admit: 2023-09-03 | Discharge: 2023-09-03 | Disposition: A | Discharge status: Home / Self Care | Payer: 59 | Source: Ambulatory Visit | Attending: Internal Medicine | Admitting: Internal Medicine

## 2023-09-03 DIAGNOSIS — Z1231 Encounter for screening mammogram for malignant neoplasm of breast: Secondary | ICD-10-CM

## 2023-12-03 ENCOUNTER — Ambulatory Visit: Payer: 59 | Admitting: Endocrinology

## 2023-12-03 ENCOUNTER — Encounter: Payer: Self-pay | Admitting: Endocrinology

## 2023-12-03 VITALS — BP 108/60 | HR 94 | Resp 20 | Ht 63.0 in | Wt 164.4 lb

## 2023-12-03 DIAGNOSIS — E119 Type 2 diabetes mellitus without complications: Secondary | ICD-10-CM

## 2023-12-03 DIAGNOSIS — Z7984 Long term (current) use of oral hypoglycemic drugs: Secondary | ICD-10-CM

## 2023-12-03 DIAGNOSIS — Z7985 Long-term (current) use of injectable non-insulin antidiabetic drugs: Secondary | ICD-10-CM

## 2023-12-03 DIAGNOSIS — E213 Hyperparathyroidism, unspecified: Secondary | ICD-10-CM

## 2023-12-03 DIAGNOSIS — E559 Vitamin D deficiency, unspecified: Secondary | ICD-10-CM | POA: Diagnosis not present

## 2023-12-03 DIAGNOSIS — E1165 Type 2 diabetes mellitus with hyperglycemia: Secondary | ICD-10-CM

## 2023-12-03 LAB — POCT GLYCOSYLATED HEMOGLOBIN (HGB A1C): Hemoglobin A1C: 6.5 % — AB (ref 4.0–5.6)

## 2023-12-03 MED ORDER — METFORMIN HCL ER 500 MG PO TB24: 500.0000 mg | ORAL_TABLET | Freq: Two times a day (BID) | ORAL | 3 refills | Status: AC

## 2023-12-03 MED ORDER — TIRZEPATIDE 15 MG/0.5ML ~~LOC~~ SOAJ: 15.0000 mg | SUBCUTANEOUS | 3 refills | Status: AC

## 2023-12-03 NOTE — Progress Notes (Signed)
Outpatient Endocrinology Note [name], MD  12/03/23  Patient's Name: [name] [name]    DOB: [birth year]    MRN: [id number]                                                    REASON OF VISIT: Follow up of type 2 diabetes mellitus /hypercalcemia  PCP: Renford Dills, MD  HISTORY OF PRESENT ILLNESS:   [name] is a 58 y.o. old female with past medical history listed below, is here for follow up for type 2 diabetes mellitus and hypercalcemia / hyperparathyroidism.   Pertinent Diabetes History: Patient was diagnosed with type 2 diabetes mellitus in 2017.  Chronic Diabetes Complications : Retinopathy: no. Last ophthalmology exam was done on annually 04/2023, following with ophthalmology regularly.  Nephropathy: no, on ACE/ARB / valsartan Peripheral neuropathy: no Coronary artery disease: CAD s/p CABG 2018. Stroke: no  Relevant comorbidities and cardiovascular risk factors: Obesity: yes Body mass index is 29.12 kg/m.  Hypertension: Yes  Hyperlipidemia : Yes, on statin / repatha  Current / Home Diabetic regimen includes: Mounjaro 15 mg weekly.  Metformin ER 500 mg two times a day.   Prior diabetic medications: Metoformin diarrhea on higher dose.   Glycemic data:   She forgot to bring glucometer into clinic today.  She uses [employer] glucometer.  Hypoglycemia: Patient has denies hypoglycemic episodes. Patient has hypoglycemia awareness.  Factors modifying glucose control: 1.  Diabetic diet assessment: 3 meals a day.  2.  Staying active or exercising:   3.  Medication compliance: compliant all of the time.  # Hypercalcemia / hyperparathyroidism Review of records show that she had a high calcium of 11.2 done in October 2017. The patient thinks that she has had higher calcium before also but no records are available. Apparently at some point she was taking HCTZ and this was stopped with some improvement in calcium. PTH level from unknown lab 29 done in 10/17 and  subsequently 32 in 8/19, normal.   - Her serum calcium has been persistently high and ranging between 10.5 -11.4 since 12/18.  Being monitored conservatively.  No history of fragility fracture.  No kidney stone. No osteoporosis on previous bone density from 09/2017.  # VITAMIN D deficiency:  - She is taking vitamin D3, 4000 units.   In November 2024 : Hypercalcemia workup showed corrected serum calcium is 10.6, serum calcium 10.4 with albumin 4.3.  Normal phosphorus.  Stable renal function EGFR 79.  Magnesium normal.  Ionized calcium mildly elevated 5.8.  Vitamin D acceptable 28.  Parathyroid hormone is elevated at 91 with upper normal limit of 65.  24-hour urine calcium normal 214.  Overall consistent with mild primary hyperparathyroidism.  Other than symptoms there is no absolute indication for parathyroid surgery.   Interval history  Patient has been taking Mounjaro 15 mg weekly.  Patient reports she had 1 episode of vomiting with abdominal discomfort yesterday however had eaten pizza at that time, did not have sickness in the past on current dose of Mounjaro.  Hemoglobin A1c today 6.5% improved from 7.4%.  She has no numbness and tingling of the feet.  No other complaints today.  She has been taking vitamin D supplement.  Does not take calcium supplement however eats dairy products including milk, cheese and yogurt occasionally, no excessive intake.  No  other complaints today.  Has not been monitoring blood sugar lately at home.  REVIEW OF SYSTEMS As per history of present illness.   PAST MEDICAL HISTORY: Past Medical History:  Diagnosis Date   Anxiety    Constipation    Coronary artery disease    Diabetes mellitus without complication (HCC)    Heart disease    History of heart attack    Hyperlipidemia    Hypertension    Obesity    SOBOE (shortness of breath on exertion)    Swelling of both lower extremities    Trouble in sleeping    Vitamin D deficiency     PAST SURGICAL  HISTORY: Past Surgical History:  Procedure Laterality Date   CARDIAC CATHETERIZATION     CESAREAN SECTION  12/13/1998   CORONARY ARTERY BYPASS GRAFT N/A 07/30/2017   Procedure: CORONARY ARTERY BYPASS GRAFTING (CABG)  x four, using right internal mammary artery, right greater saphenous vein, left radial artery;  Surgeon: Loreli Slot, MD;  Location: [facility] OR;  Service: Open Heart Surgery;  Laterality: N/A;   INTRAOPERATIVE TRANSESOPHAGEAL ECHOCARDIOGRAM N/A 07/30/2017   Procedure: INTRAOPERATIVE TRANSESOPHAGEAL ECHOCARDIOGRAM;  Surgeon: Loreli Slot, MD;  Location: [facility] OR;  Service: Open Heart Surgery;  Laterality: N/A;   LEFT HEART CATH AND CORONARY ANGIOGRAPHY N/A 07/29/2017   Procedure: LEFT HEART CATH AND CORONARY ANGIOGRAPHY;  Surgeon: Swaziland, Peter M, MD;  Location: [facility] INVASIVE CV LAB;  Service: Cardiovascular;  Laterality: N/A;   LEFT HEART CATH AND CORS/GRAFTS ANGIOGRAPHY N/A 07/25/2018   Procedure: LEFT HEART CATH AND CORS/GRAFTS ANGIOGRAPHY;  Surgeon: Runell Gess, MD;  Location: [facility] INVASIVE CV LAB;  Service: Cardiovascular;  Laterality: N/A;   MYOMECTOMY  09/1994   PARTIAL HYSTERECTOMY  2005   RADIAL ARTERY HARVEST Left 07/30/2017   Procedure: RADIAL ARTERY HARVEST;  Surgeon: Loreli Slot, MD;  Location: [facility] OR;  Service: Open Heart Surgery;  Laterality: Left;    ALLERGIES: Allergies  Allergen Reactions   Penicillins Hives    Has patient had a PCN reaction causing immediate rash, facial/tongue/throat swelling, SOB or lightheadedness with hypotension: unkn Has patient had a PCN reaction causing severe rash involving mucus membranes or skin necrosis: unkn Has patient had a PCN reaction that required hospitalization: no Has patient had a PCN reaction occurring within the last 10 years: no If all of the above answers are "NO", then may proceed with Cephalosporin use.     FAMILY HISTORY:  Family History  Problem Relation Age of Onset   Hypertension  Other    Hypertension Mother    Hyperlipidemia Mother    Obesity Mother    Diabetes Father    Hypertension Father    Hyperlipidemia Father     SOCIAL HISTORY: Social History   Socioeconomic History   Marital status: Married    Spouse name: Najat Olazabal   Number of children: 1   Years of education: Not on file   Highest education level: Not on file  Occupational History   Occupation: Health visitor  Tobacco Use   Smoking status: Never   Smokeless tobacco: Never  Vaping Use   Vaping status: Never Used  Substance and Sexual Activity   Alcohol use: Yes   Drug use: No   Sexual activity: Not on file  Other Topics Concern   Not on file  Social History Narrative   Not on file   Social Drivers of Health   Financial Resource Strain: Not on file  Food Insecurity: Not  on file  Transportation Needs: Not on file  Physical Activity: Insufficiently Active (09/02/2017)   Exercise Vital Sign    Days of Exercise per Week: 4 days    Minutes of Exercise per Session: 30 min  Stress: Stress Concern Present (09/02/2017)   [employer] of Occupational Health - Occupational Stress Questionnaire    Feeling of Stress : Rather much  Social Connections: Not on file    MEDICATIONS:  Current Outpatient Medications  Medication Sig Dispense Refill   amLODipine (NORVASC) 10 MG tablet TAKE 1 TABLET BY MOUTH EVERY DAY 30 tablet 11   atorvastatin (LIPITOR) 20 MG tablet TAKE 1 TABLET BY MOUTH EVERY DAY 30 tablet 11   Evolocumab (REPATHA SURECLICK) 140 MG/ML SOAJ INJECT 140MG  ( 1 PEN) INTO THE SKIN EVERY 14 DAYS 2 mL 11   metoprolol tartrate (LOPRESSOR) 100 MG tablet Take 1 tablet (100 mg total) by mouth 2 (two) times daily. Please call office to schedule an appt for further refills. Thank you 180 tablet 0   valsartan (DIOVAN) 160 MG tablet TAKE 1 TABLET BY MOUTH EVERYDAY AT BEDTIME 30 tablet 11   metFORMIN (GLUCOPHAGE-XR) 500 MG 24 hr tablet Take 1 tablet (500 mg total) by mouth 2  (two) times daily. 180 tablet 3   tirzepatide (MOUNJARO) 15 MG/0.5ML Pen Inject 15 mg into the skin once a week. 6 mL 3   No current facility-administered medications for this visit.    PHYSICAL EXAM: Vitals:   12/03/23 0944  BP: 108/60  Pulse: 94  Resp: 20  SpO2: 92%  Weight: 164 lb 6.4 oz (74.6 kg)  Height: 5\' 3"  (1.6 m)    Body mass index is 29.12 kg/m.  Wt Readings from Last 3 Encounters:  12/03/23 164 lb 6.4 oz (74.6 kg)  08/27/23 173 lb 6.4 oz (78.7 kg)  05/26/23 173 lb (78.5 kg)    General: Well developed, well nourished female in no apparent distress.  HEENT: AT/[place], no external lesions.  Eyes: Conjunctiva clear and no icterus. Neck: Neck supple  Lungs: Respirations not labored Neurologic: Alert, oriented, normal speech Extremities / Skin: Dry.  Psychiatric: Does not appear depressed or anxious  Diabetic Foot Exam - Simple   No data filed    LABS Reviewed Lab Results  Component Value Date   HGBA1C 6.5 (A) 12/03/2023   HGBA1C 7.4 (H) 08/20/2023   HGBA1C 6.7 (H) 05/21/2023   No results found for: "FRUCTOSAMINE" Lab Results  Component Value Date   CHOL 123 02/03/2022   HDL 41 02/03/2022   LDLCALC 56 02/03/2022   TRIG 152 (H) 02/03/2022   CHOLHDL 3.0 02/03/2022   Lab Results  Component Value Date   MICRALBCREAT 1.4 09/02/2021   MICRALBCREAT 2.0 08/15/2020   Lab Results  Component Value Date   CREATININE 0.82 08/27/2023   Lab Results  Component Value Date   GFR 79.15 08/27/2023    ASSESSMENT / PLAN  1. Uncontrolled type 2 diabetes mellitus with hyperglycemia, without long-term current use of insulin (HCC)   2. Hypercalcemia   3. Hyperparathyroidism (HCC)   4. Vitamin D deficiency   5. Diabetes mellitus type II, non insulin dependent (HCC)     Diabetes Mellitus type 2, complicated by CAD - Diabetic status / severity: Fair control  Lab Results  Component Value Date   HGBA1C 6.5 (A) 12/03/2023    - Hemoglobin A1c goal : <6.5%  -  Medications: No change.  I) continue Mounjaro 15 mg weekly. II) continue metformin extended release  500 mg with meals 2 times a day.  - Home glucose testing: Advised to check blood sugar at least few times a week and asked to bring glucometer in the follow-up visit.  - Discussed/ Gave Hypoglycemia treatment plan.  # Consult : not required at this time.   # Annual urine for microalbuminuria/ creatinine ratio, no microalbuminuria currently, continue ACE/ARB valsartan, will check lab in the next follow-up visit.  Patient is not able to provide sample today. Last  Lab Results  Component Value Date   MICRALBCREAT 1.4 09/02/2021    # Foot check nightly.  # Annual dilated diabetic eye exams.   - Diet: Make healthy diabetic food choices - Life style / activity / exercise: Discussed.  2. Blood pressure  -  BP Readings from Last 1 Encounters:  12/03/23 108/60    - Control is in target.  - No change in current plans.  3. Lipid status / Hyperlipidemia - Last  Lab Results  Component Value Date   LDLCALC 56 02/03/2022   - Continue atorvastatin 20 mg daily.  On Repatha as well.  Managed by cardiology.    # Hypercalcemia /hyperparathyroidism -Patient has persistently high calcium at least from 2018.  Being monitored conservatively. -She has complaints of muscle ache, worsening GERD and fatigue.  No history of kidney stone, fragility fracture or osteoporosis.  Last DEXA scan was in 2018.  # Vitamin D deficiency -Recent vitamin D level acceptable 28.  Continue current vitamin D supplement.  In November 2024 : Hypercalcemia workup showed corrected serum calcium is 10.6, serum calcium 10.4 with albumin 4.3.  Normal phosphorus.  Stable renal function EGFR 79.  Magnesium normal.  Ionized calcium mildly elevated 5.8.  Vitamin D acceptable 28.  Parathyroid hormone is elevated at 91 with upper normal limit of 65.  24-hour urine calcium normal 214.  Overall consistent with mild primary  hyperparathyroidism.  Other than symptoms there is no absolute indication for parathyroid surgery.  Discussed about further parathyroid imaging studies to plan for parathyroid surgery versus conservative monitoring.  In this context it is okay to monitor conservative without going for definitive treatment with parathyroid surgery.  Patient prefers and agreed with conservative monitoring.  Will check BMP with EGFR and albumin, PTH, vitamin D level prior to next follow-up visit.  Diagnoses and all orders for this visit:  Uncontrolled type 2 diabetes mellitus with hyperglycemia, without long-term current use of insulin (HCC) -     POCT glycosylated hemoglobin (Hb A1C) -     BASIC METABOLIC PANEL WITH GFR -     Hemoglobin A1c -     Microalbumin / creatinine urine ratio  Hypercalcemia -     Parathyroid hormone, intact (no Ca)  Hyperparathyroidism (HCC) -     BASIC METABOLIC PANEL WITH GFR -     Albumin -     Parathyroid hormone, intact (no Ca)  Vitamin D deficiency -     VITAMIN D 25 Hydroxy (Vit-D Deficiency, Fractures)  Diabetes mellitus type II, non insulin dependent (HCC) -     tirzepatide (MOUNJARO) 15 MG/0.5ML Pen; Inject 15 mg into the skin once a week. -     metFORMIN (GLUCOPHAGE-XR) 500 MG 24 hr tablet; Take 1 tablet (500 mg total) by mouth 2 (two) times daily.   DISPOSITION Follow up in clinic in 4  months suggested.   All questions answered and patient verbalized understanding of the plan.  [place] Taiana Temkin, MD [facility] [address], [address] [place], [place] [address] Phone # [phone]  At least part of this note was generated using voice recognition software. Inadvertent word errors may have occurred, which were not recognized during the proofreading process.

## 2024-02-03 ENCOUNTER — Encounter: Payer: Self-pay | Admitting: Cardiology

## 2024-02-03 LAB — LIPID PANEL
Chol/HDL Ratio: 2.8 ratio (ref 0.0–4.4)
Cholesterol, Total: 120 mg/dL (ref 100–199)
HDL: 43 mg/dL (ref >39)
LDL Chol Calc (NIH): 55 mg/dL (ref 0–99)
Triglycerides: 121 mg/dL (ref 0–149)
VLDL Cholesterol Cal: 22 mg/dL (ref 5–40)

## 2024-02-03 LAB — HEPATIC FUNCTION PANEL
ALT: 15 IU/L (ref 0–32)
AST: 19 IU/L (ref 0–40)
Albumin: 4.4 g/dL (ref 3.8–4.9)
Alkaline Phosphatase: 73 IU/L (ref 44–121)
Bilirubin Total: 0.4 mg/dL (ref 0.0–1.2)
Bilirubin, Direct: 0.15 mg/dL (ref 0.00–0.40)
Total Protein: 7.5 g/dL (ref 6.0–8.5)

## 2024-02-03 NOTE — Progress Notes (Signed)
 Lab results show excellent cholesterol control.  Similar to 2 years ago but the triglycerides have improved.  Total cholesterol 120 and LDL is 55.  Triglycerides are now back to 121 and LDL is stable at 43. Liver function tests are normal.  Continue current medicines. Randene Bustard, MD

## 2024-02-08 ENCOUNTER — Ambulatory Visit: Payer: 59 | Attending: Cardiovascular Disease | Admitting: Cardiovascular Disease

## 2024-02-08 VITALS — BP 120/82 | HR 73 | Ht 60.0 in | Wt 164.0 lb

## 2024-02-08 DIAGNOSIS — I10 Essential (primary) hypertension: Secondary | ICD-10-CM | POA: Diagnosis not present

## 2024-02-08 DIAGNOSIS — I152 Hypertension secondary to endocrine disorders: Secondary | ICD-10-CM

## 2024-02-08 DIAGNOSIS — Z951 Presence of aortocoronary bypass graft: Secondary | ICD-10-CM | POA: Diagnosis not present

## 2024-02-08 DIAGNOSIS — E785 Hyperlipidemia, unspecified: Secondary | ICD-10-CM | POA: Diagnosis not present

## 2024-02-08 DIAGNOSIS — E669 Obesity, unspecified: Secondary | ICD-10-CM

## 2024-02-08 DIAGNOSIS — E1159 Type 2 diabetes mellitus with other circulatory complications: Secondary | ICD-10-CM | POA: Diagnosis not present

## 2024-02-08 MED ORDER — ATORVASTATIN CALCIUM 20 MG PO TABS: 20.0000 mg | ORAL_TABLET | Freq: Every day | ORAL | 3 refills | Status: AC

## 2024-02-08 MED ORDER — VALSARTAN 160 MG PO TABS: ORAL_TABLET | ORAL | 3 refills | Status: AC

## 2024-02-08 MED ORDER — AMLODIPINE BESYLATE 5 MG PO TABS
5.0000 mg | ORAL_TABLET | Freq: Every day | ORAL | 3 refills | Status: AC
Start: 2024-02-08 — End: ?

## 2024-02-08 MED ORDER — METOPROLOL TARTRATE 100 MG PO TABS: 100.0000 mg | ORAL_TABLET | Freq: Two times a day (BID) | ORAL | 3 refills | Status: AC

## 2024-02-08 NOTE — Patient Instructions (Signed)
 Medication Instructions:  Your physician has recommended you make the following change in your medication:   -Decrease amlodipine  (norvasc ) to 5mg  once daily.  *If you need a refill on your cardiac medications before your next appointment, please call your pharmacy*  Follow-Up: At [facility], you and your health needs are our priority.  As part of our continuing mission to provide you with exceptional heart care, our providers are all part of one team.  This team includes your primary Cardiologist (physician) and Advanced Practice Providers or APPs (Physician Assistants and Nurse Practitioners) who all work together to provide you with the care you need, when you need it.  Your next appointment:   3 month(s)  Provider:   Callie Goodrich, PA-C, Hao Meng, PA-C, Marlana Silvan, NP, or Katlyn West, NP       Then, Lauro Portal, MD will plan to see you again in 12 month(s).    We recommend signing up for the patient portal called "MyChart".  Sign up information is provided on this After Visit Summary.  MyChart is used to connect with patients for Virtual Visits (Telemedicine).  Patients are able to view lab/test results, encounter notes, upcoming appointments, etc.  Non-urgent messages can be sent to your provider as well.   To learn more about what you can do with MyChart, go to [email or web address].   Other Instructions Please keep a blood pressure log, record your readings once a day.    If you monitor your blood pressure (BP) at home, please bring your BP cuff and your BP readings with you to this appointment  HOW TO TAKE YOUR BLOOD PRESSURE: Rest 5 minutes before taking your blood pressure. Don't smoke or drink caffeinated beverages for at least 30 minutes before. Take your blood pressure before (not after) you eat. Sit comfortably with your back supported and both feet on the floor (don't cross your legs). Elevate your arm to heart level on a table or a desk. Use the  proper sized cuff. It should fit smoothly and snugly around your bare upper arm. There should be enough room to slip a fingertip under the cuff. The bottom edge of the cuff should be 1 inch above the crease of the elbow. Ideally, take 3 measurements at one sitting and record the average.        1st Floor: - Lobby - Registration  - Pharmacy  - Lab - Cafe  2nd Floor: - PV Lab - Diagnostic Testing (echo, CT, nuclear med)  3rd Floor: - Vacant  4th Floor: - TCTS (cardiothoracic surgery) - AFib Clinic - Structural Heart Clinic - Vascular Surgery  - Vascular Ultrasound  5th Floor: - HeartCare Cardiology (general and EP) - Clinical Pharmacy for coumadin, hypertension, lipid, weight-loss medications, and med management appointments    Valet parking services will be available as well.

## 2024-02-08 NOTE — Progress Notes (Signed)
 02/08/2024 [name] [name]   September 14, 1966  [id number]  Primary Physician Merl Star, MD Primary Cardiologist: Avanell Leigh MD Bennye Bravo, [place]  HPI:  [name] [name] is a 58 y.o.  moderately overweight married African-American female mother of one 57 year old daughter who I last saw in the office 02/06/23.. I met during her hospitalization on 07/29/17 when she was admitted with a non-STEMI. She required cardiac catheterization that day by Dr. Swazil[place] revealing three-vessel disease with normal LV function and bypass grafting the following day by Dr. Luna Salinas is a RIMA to the PDA, vein to the second diagonal branch and sequential radial to OM 1 and 2. Her postoperative course is uncooperative. She was discharged on 08/06/17.   Because of left arm pain she underwent Myoview  stress testing 07/20/2018 which revealed transient ischemic dilatation which could have been an anginal equivalent.  Based on this I performed cardiac catheterization on her as an outpatient 07/25/2018 revealing widely patent grafts.  Her most recent lipid profile on statin therapy performed 02/22/2019 revealed total cholesterol 161, LDL of 96 and HDL 36.   Since I saw her a year ago she continues to do well.  She was seeing Dr. Carolene Chute for weight loss, her weight loss really was unremarkable until she began Mounjaro  which has resulted in 10 pound weight loss.  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  She is under a lot of stress at work because of manpower issues and has come describe some atypical chest heaviness.   Current Meds  Medication Sig   amLODipine  (NORVASC ) 10 MG tablet TAKE 1 TABLET BY MOUTH EVERY DAY   atorvastatin  (LIPITOR ) 20 MG tablet TAKE 1 TABLET BY MOUTH EVERY DAY   Evolocumab  (REPATHA  SURECLICK) 140 MG/ML SOAJ INJECT 140MG  ( 1 PEN) INTO THE SKIN EVERY 14 DAYS   metFORMIN  (GLUCOPHAGE -XR) 500 MG 24 hr tablet Take 1 tablet (500 mg total) by mouth 2 (two)  times daily.   metoprolol  tartrate (LOPRESSOR ) 100 MG tablet Take 1 tablet (100 mg total) by mouth 2 (two) times daily. Please call office to schedule an appt for further refills. Thank you   tirzepatide  (MOUNJARO ) 15 MG/0.5ML Pen Inject 15 mg into the skin once a week.   valsartan  (DIOVAN ) 160 MG tablet TAKE 1 TABLET BY MOUTH EVERYDAY AT BEDTIME     Allergies  Allergen Reactions   Penicillins Hives    Has patient had a PCN reaction causing immediate rash, facial/tongue/throat swelling, SOB or lightheadedness with hypotension: unkn Has patient had a PCN reaction causing severe rash involving mucus membranes or skin necrosis: unkn Has patient had a PCN reaction that required hospitalization: no Has patient had a PCN reaction occurring within the last 10 years: no If all of the above answers are "NO", then may proceed with Cephalosporin use.     Social History   Socioeconomic History   Marital status: Married    Spouse name: Akeela Busk   Number of children: 1   Years of education: Not on file   Highest education level: Not on file  Occupational History   Occupation: Health visitor  Tobacco Use   Smoking status: Never   Smokeless tobacco: Never  Vaping Use   Vaping status: Never Used  Substance and Sexual Activity   Alcohol  use: Yes   Drug use: No   Sexual activity: Not on file  Other Topics Concern   Not on file  Social  History Narrative   Not on file   Social Drivers of Health   Financial Resource Strain: Not on file  Food Insecurity: Not on file  Transportation Needs: Not on file  Physical Activity: Insufficiently Active (09/02/2017)   Exercise Vital Sign    Days of Exercise per Week: 4 days    Minutes of Exercise per Session: 30 min  Stress: Stress Concern Present (09/02/2017)   [employer] of Occupational Health - Occupational Stress Questionnaire    Feeling of Stress : Rather much  Social Connections: Not on file  Intimate Partner Violence:  Not on file     Review of Systems: General: negative for chills, fever, night sweats or weight changes.  Cardiovascular: negative for chest pain, dyspnea on exertion, edema, orthopnea, palpitations, paroxysmal nocturnal dyspnea or shortness of breath Dermatological: negative for rash Respiratory: negative for cough or wheezing Urologic: negative for hematuria Abdominal: negative for nausea, vomiting, diarrhea, bright red blood per rectum, melena, or hematemesis Neurologic: negative for visual changes, syncope, or dizziness All other systems reviewed and are otherwise negative except as noted above.    Blood pressure 120/82, pulse 73, height 5' (1.524 m), weight 164 lb (74.4 kg), SpO2 97%.  General appearance: alert and no distress Neck: no adenopathy, no carotid bruit, no JVD, supple, symmetrical, trachea midline, and thyroid not enlarged, symmetric, no tenderness/mass/nodules Lungs: clear to auscultation bilaterally Heart: regular rate and rhythm, S1, S2 normal, no murmur, click, rub or gallop Extremities: extremities normal, atraumatic, no cyanosis or edema Pulses: 2+ and symmetric Skin: Skin color, texture, turgor normal. No rashes or lesions Neurologic: Grossly normal  EKG EKG Interpretation Date/Time:  Tuesday February 08 2024 09:17:43 EDT Ventricular Rate:  73 PR Interval:  180 QRS Duration:  86 QT Interval:  380 QTC Calculation: 418 R Axis:   -13  Text Interpretation: Normal sinus rhythm Normal ECG When compared with ECG of 31-Jul-2017 07:20, Vent. rate has decreased BY  40 BPM Criteria for Inferior infarct are no longer Present ST no longer elevated in Anterolateral leads Confirmed by Lauro Portal [phone]) on 02/08/2024 9:21:05 AM    ASSESSMENT AND PLAN:   Dyslipidemia History of dyslipidemia on statin therapy and Repatha  with a lipid profile performed 02/03/2024 revealing total cholesterol 120, LDL 55 and HDL 43.  Essential hypertension History of essential  hypertension with blood pressure measured today at 120/82.  She is on amlodipine , metoprolol  and valsartan .  At her request, I am going to decrease her amlodipine  from 10 mg to 5 mg a day.  She will keep a 30 blood pressure log and she will see an APP back in 3 months to review make appropriate changes.  Hx of CABG She has CAD status post non-STEMI 07/29/2017.  She had cardiac catheterization performed by Dr. Swaziland that revealed three-vessel disease.  She ultimately underwent CABG by Dr. Luna Salinas with a RIMA to the PDA, sequential vein graft to the diagonal branch, OM 1 and 2 sequentially.  She has had a subsequent Performed by myself 07/25/2018 after Myoview  stress test showed 3 times daily.  This revealed patent grafts.  She still gets occasional atypical random chest pains which have not changed in frequency or severity since I saw her last  Obesity (BMI 30-39.9) History of obesity with BMI of 32.  She is lost 10 pounds since I saw her last on Mounjaro .  [facility]     Avanell Leigh MD FACP,FACC,FAHA, [facility] 02/08/2024 9:30  AM

## 2024-02-08 NOTE — Assessment & Plan Note (Signed)
 History of essential hypertension with blood pressure measured today at 120/82.  She is on amlodipine , metoprolol  and valsartan .  At her request, I am going to decrease her amlodipine  from 10 mg to 5 mg a day.  She will keep a 30 blood pressure log and she will see an APP back in 3 months to review make appropriate changes.

## 2024-02-08 NOTE — Assessment & Plan Note (Signed)
 History of obesity with BMI of 32.  She is lost 10 pounds since I saw her last on Mounjaro .  [facility].

## 2024-02-08 NOTE — Assessment & Plan Note (Signed)
 She has CAD status post non-STEMI 07/29/2017.  She had cardiac catheterization performed by Dr. Swaziland that revealed three-vessel disease.  She ultimately underwent CABG by Dr. Luna Salinas with a RIMA to the PDA, sequential vein graft to the diagonal branch, OM 1 and 2 sequentially.  She has had a subsequent Performed by myself 07/25/2018 after Myoview  stress test showed 3 times daily.  This revealed patent grafts.  She still gets occasional atypical random chest pains which have not changed in frequency or severity since I saw her last

## 2024-02-08 NOTE — Assessment & Plan Note (Signed)
 History of dyslipidemia on statin therapy and Repatha  with a lipid profile performed 02/03/2024 revealing total cholesterol 120, LDL 55 and HDL 43.

## 2024-03-11 ENCOUNTER — Other Ambulatory Visit: Payer: Self-pay | Admitting: Cardiovascular Disease

## 2024-03-14 ENCOUNTER — Other Ambulatory Visit: Payer: Self-pay | Admitting: Pharmacist

## 2024-03-14 MED ORDER — REPATHA SURECLICK 140 MG/ML ~~LOC~~ SOAJ: SUBCUTANEOUS | 11 refills | Status: AC

## 2024-03-21 ENCOUNTER — Telehealth: Payer: Self-pay

## 2024-03-21 NOTE — Telephone Encounter (Signed)
 PA needed for mounjaro  per patient

## 2024-03-22 ENCOUNTER — Telehealth: Payer: Self-pay | Admitting: Pharmacy Technician

## 2024-03-22 ENCOUNTER — Other Ambulatory Visit: Payer: Self-pay

## 2024-03-22 NOTE — Telephone Encounter (Signed)
 Pharmacy Patient Advocate Encounter   Received notification from CoverMyMeds that prior authorization for Mounjaro  15MG /0.5ML auto-injectors is required/requested.   Insurance verification completed.   The patient is insured through [employer] .   Per test claim: PA required; PA submitted to above mentioned insurance via CoverMyMeds Key/confirmation #/EOC [id number] Status is pending

## 2024-03-23 ENCOUNTER — Other Ambulatory Visit (HOSPITAL_COMMUNITY): Payer: Self-pay

## 2024-03-23 NOTE — Telephone Encounter (Signed)
 Pharmacy Patient Advocate Encounter  Received notification from [employer] that Prior Authorization for Mounjaro  15MG /0.5ML auto-injectors has been APPROVED from 02/21/2024 to 03/22/2025   PA #/Case ID/Reference #: [id number]

## 2024-03-30 ENCOUNTER — Other Ambulatory Visit: Payer: 59

## 2024-03-31 ENCOUNTER — Ambulatory Visit: Payer: Self-pay | Admitting: Endocrinology

## 2024-04-03 ENCOUNTER — Encounter: Payer: Self-pay | Admitting: Endocrinology

## 2024-04-03 ENCOUNTER — Ambulatory Visit: Payer: 59 | Admitting: Endocrinology

## 2024-04-03 VITALS — BP 124/80 | HR 73 | Ht 60.0 in | Wt 163.5 lb

## 2024-04-03 DIAGNOSIS — E559 Vitamin D deficiency, unspecified: Secondary | ICD-10-CM | POA: Diagnosis not present

## 2024-04-03 DIAGNOSIS — Z7985 Long-term (current) use of injectable non-insulin antidiabetic drugs: Secondary | ICD-10-CM

## 2024-04-03 DIAGNOSIS — E213 Hyperparathyroidism, unspecified: Secondary | ICD-10-CM | POA: Diagnosis not present

## 2024-04-03 DIAGNOSIS — E1165 Type 2 diabetes mellitus with hyperglycemia: Secondary | ICD-10-CM | POA: Diagnosis not present

## 2024-04-03 DIAGNOSIS — Z7984 Long term (current) use of oral hypoglycemic drugs: Secondary | ICD-10-CM

## 2024-04-03 NOTE — Progress Notes (Signed)
 Outpatient Endocrinology Note [place] Zarriah Starkel, MD  04/03/24  Patient's Name: [name] [name]    DOB: 1966/02/08    MRN: [id number]                                                    REASON OF VISIT: Follow up of type 2 diabetes mellitus /hypercalcemia  PCP: Merl Star, MD  HISTORY OF PRESENT ILLNESS:   [name] [name] is a 58 y.o. old female with past medical history listed below, is here for follow up for type 2 diabetes mellitus and hypercalcemia / hyperparathyroidism.   Pertinent Diabetes History: Patient was diagnosed with type 2 diabetes mellitus in 2017.  Chronic Diabetes Complications : Retinopathy: no. Last ophthalmology exam was done on annually 04/2023, following with ophthalmology regularly.  Nephropathy: no, on ACE/ARB / valsartan  Peripheral neuropathy: no Coronary artery disease: CAD s/p CABG 2018. Stroke: no  Relevant comorbidities and cardiovascular risk factors: Obesity: yes Body mass index is 31.93 kg/m.  Hypertension: Yes  Hyperlipidemia : Yes, on statin / repatha   Current / Home Diabetic regimen includes: Mounjaro  15 mg weekly.  Metformin  ER 500 mg two times a day.   Prior diabetic medications: Metoformin diarrhea on higher dose.   Glycemic data:   She has glucose log.  She uses Livongo glucometer.  Hypoglycemia: Patient has denies hypoglycemic episodes. Patient has hypoglycemia awareness.  Factors modifying glucose control: 1.  Diabetic diet assessment: 3 meals a day.  2.  Staying active or exercising:   3.  Medication compliance: compliant all of the time.  # Hypercalcemia / hyperparathyroidism Review of records show that she had a high calcium  of 11.2 done in October 2017. The patient thinks that she has had higher calcium  before also but no records are available. Apparently at some point she was taking HCTZ and this was stopped with some improvement in calcium . PTH level from unknown lab 29 done in 10/17 and subsequently 32 in 8/19, normal.    - Her serum calcium  has been persistently high and ranging between 10.5 -11.4 since 12/18.  Being monitored conservatively.  No history of fragility fracture.  No kidney stone. No osteoporosis on previous bone density from 09/2017.  # VITAMIN D  deficiency:  - She  was taking vitamin D3, 4000 units.  Lately taking 1000 international unit daily.  In November 2024 : Hypercalcemia workup showed corrected serum calcium  is 10.6, serum calcium  10.4 with albumin  4.3.  Normal phosphorus.  Stable renal function EGFR 79.  Magnesium  normal.  Ionized calcium  mildly elevated 5.8.  Vitamin D  acceptable 28.  Parathyroid  hormone is elevated at 91 with upper normal limit of 65.  24-hour urine calcium  normal 214.  Overall consistent with mild primary hyperparathyroidism.  Other than symptoms there is no absolute indication for parathyroid  surgery.   Interval history  Diabetes regimen as reviewed above.  Hemoglobin A1c 7% slightly increased.  She complains of itching of the hand and feet otherwise no complaints today.  No numbness and tingling of the feet.  Recent laboratory results reviewed.  Corrected serum calcium  is 10.3 in the normal range.  PTH is elevated.  Vitamin D  is normalized 34.  Renal function is stable.  Urine microalbumin creatinine ratio normal.  She reports taking vitamin D3 1000 international unit daily.  No other complaints today.  REVIEW OF SYSTEMS As  per history of present illness.   PAST MEDICAL HISTORY: Past Medical History:  Diagnosis Date   Anxiety    Constipation    Coronary artery disease    Diabetes mellitus without complication (HCC)    Heart disease    History of heart attack    Hyperlipidemia    Hypertension    Obesity    SOBOE (shortness of breath on exertion)    Swelling of both lower extremities    Trouble in sleeping    Vitamin D  deficiency     PAST SURGICAL HISTORY: Past Surgical History:  Procedure Laterality Date   CARDIAC CATHETERIZATION     CESAREAN  SECTION  12/13/1998   CORONARY ARTERY BYPASS GRAFT N/A 07/30/2017   Procedure: CORONARY ARTERY BYPASS GRAFTING (CABG)  x four, using right internal mammary artery, right greater saphenous vein, left radial artery;  Surgeon: Zelphia Higashi, MD;  Location: [facility] OR;  Service: Open Heart Surgery;  Laterality: N/A;   INTRAOPERATIVE TRANSESOPHAGEAL ECHOCARDIOGRAM N/A 07/30/2017   Procedure: INTRAOPERATIVE TRANSESOPHAGEAL ECHOCARDIOGRAM;  Surgeon: Zelphia Higashi, MD;  Location: [facility] OR;  Service: Open Heart Surgery;  Laterality: N/A;   LEFT HEART CATH AND CORONARY ANGIOGRAPHY N/A 07/29/2017   Procedure: LEFT HEART CATH AND CORONARY ANGIOGRAPHY;  Surgeon: Swaziland, Peter M, MD;  Location: [facility] INVASIVE CV LAB;  Service: Cardiovascular;  Laterality: N/A;   LEFT HEART CATH AND CORS/GRAFTS ANGIOGRAPHY N/A 07/25/2018   Procedure: LEFT HEART CATH AND CORS/GRAFTS ANGIOGRAPHY;  Surgeon: Avanell Leigh, MD;  Location: [facility] INVASIVE CV LAB;  Service: Cardiovascular;  Laterality: N/A;   MYOMECTOMY  09/1994   PARTIAL HYSTERECTOMY  2005   RADIAL ARTERY HARVEST Left 07/30/2017   Procedure: RADIAL ARTERY HARVEST;  Surgeon: Zelphia Higashi, MD;  [facility];  Service: Open Heart Surgery;  Laterality: Left;    ALLERGIES: Allergies  Allergen Reactions   Penicillins Hives    Has patient had a PCN reaction causing immediate rash, facial/tongue/throat swelling, SOB or lightheadedness with hypotension: unkn Has patient had a PCN reaction causing severe rash involving mucus membranes or skin necrosis: unkn Has patient had a PCN reaction that required hospitalization: no Has patient had a PCN reaction occurring within the last 10 years: no If all of the above answers are NO, then may proceed with Cephalosporin use.     FAMILY HISTORY:  Family History  Problem Relation Age of Onset   Hypertension Other    Hypertension Mother    Hyperlipidemia Mother    Obesity Mother    Diabetes Father     Hypertension Father    Hyperlipidemia Father     SOCIAL HISTORY: Social History   Socioeconomic History   Marital status: Married    Spouse name: Sheza Strickland   Number of children: 1   Years of education: Not on file   Highest education level: Not on file  Occupational History   Occupation: Health visitor  Tobacco Use   Smoking status: Never   Smokeless tobacco: Never  Vaping Use   Vaping status: Never Used  Substance and Sexual Activity   Alcohol  use: Yes   Drug use: No   Sexual activity: Not on file  Other Topics Concern   Not on file  Social History Narrative   Not on file   Social Drivers of Health   Financial Resource Strain: Not on file  Food Insecurity: Not on file  Transportation Needs: Not on file  Physical Activity: Insufficiently Active (09/02/2017)   Exercise Vital  Sign    Days of Exercise per Week: 4 days    Minutes of Exercise per Session: 30 min  Stress: Stress Concern Present (09/02/2017)   [employer] of Occupational Health - Occupational Stress Questionnaire    Feeling of Stress : Rather much  Social Connections: Not on file    MEDICATIONS:  Current Outpatient Medications  Medication Sig Dispense Refill   amLODipine  (NORVASC ) 5 MG tablet Take 1 tablet (5 mg total) by mouth daily. 90 tablet 3   atorvastatin  (LIPITOR ) 20 MG tablet Take 1 tablet (20 mg total) by mouth daily. 90 tablet 3   Evolocumab  (REPATHA  SURECLICK) 140 MG/ML SOAJ INJECT 140MG  ( 1 PEN) INTO THE SKIN EVERY 14 DAYS 2 mL 11   Fluocinolone Acetonide Scalp 0.01 % OIL SMARTSIG:Topical 1-2 Times Daily PRN     metFORMIN  (GLUCOPHAGE -XR) 500 MG 24 hr tablet Take 1 tablet (500 mg total) by mouth 2 (two) times daily. 180 tablet 3   metoprolol  tartrate (LOPRESSOR ) 100 MG tablet Take 1 tablet (100 mg total) by mouth 2 (two) times daily. Please call office to schedule an appt for further refills. Thank you 180 tablet 3   tirzepatide  (MOUNJARO ) 15 MG/0.5ML Pen Inject 15 mg into  the skin once a week. 6 mL 3   valsartan  (DIOVAN ) 160 MG tablet TAKE 1 TABLET BY MOUTH EVERYDAY AT BEDTIME 90 tablet 3   No current facility-administered medications for this visit.    PHYSICAL EXAM: Vitals:   04/03/24 1501  BP: 124/80  Pulse: 73  SpO2: 96%  Weight: 163 lb 8 oz (74.2 kg)  Height: 5' (1.524 m)    Body mass index is 31.93 kg/m.  Wt Readings from Last 3 Encounters:  04/03/24 163 lb 8 oz (74.2 kg)  02/08/24 164 lb (74.4 kg)  12/03/23 164 lb 6.4 oz (74.6 kg)    General: Well developed, well nourished female in no apparent distress.  HEENT: AT/, no external lesions.  Eyes: Conjunctiva clear and no icterus. Neck: Neck supple  Lungs: Respirations not labored Neurologic: Alert, oriented, normal speech Extremities / Skin: Dry.  Psychiatric: Does not appear depressed or anxious  Diabetic Foot Exam - Simple   No data filed    LABS Reviewed Lab Results  Component Value Date   HGBA1C 7.0 (H) 03/30/2024   HGBA1C 6.5 (A) 12/03/2023   HGBA1C 7.4 (H) 08/20/2023   No results found for: FRUCTOSAMINE Lab Results  Component Value Date   CHOL 120 02/03/2024   HDL 43 02/03/2024   LDLCALC 55 02/03/2024   TRIG 121 02/03/2024   CHOLHDL 2.8 02/03/2024   Lab Results  Component Value Date   MICRALBCREAT 7 03/30/2024   MICRALBCREAT 1.4 09/02/2021   Lab Results  Component Value Date   CREATININE 0.84 03/30/2024   Lab Results  Component Value Date   GFR 79.15 08/27/2023    ASSESSMENT / PLAN  1. Uncontrolled type 2 diabetes mellitus with hyperglycemia, without long-term current use of insulin  (HCC)   2. Hypercalcemia   3. Hyperparathyroidism (HCC)   4. Vitamin D  deficiency     Diabetes Mellitus type 2, complicated by CAD - Diabetic status / severity: Fair control  Lab Results  Component Value Date   HGBA1C 7.0 (H) 03/30/2024    - Hemoglobin A1c goal : <6.5%  Discussed about limiting carbohydrate and increase physical activity with the schedule  exercise.  No change in medication dose at this time.  - Medications: No change.  I) continue Mounjaro  15  mg weekly. II) continue metformin  extended release 500 mg with meals 2 times a day.  - Home glucose testing: Advised to check blood sugar in the morning fasting and at bedtime.   - Discussed/ Gave Hypoglycemia treatment plan.  # Consult : not required at this time.   # Annual urine for microalbuminuria/ creatinine ratio, no microalbuminuria currently, continue ACE/ARB valsartan .  Last  Lab Results  Component Value Date   MICRALBCREAT 7 03/30/2024    # Foot check nightly.  # Annual dilated diabetic eye exams.   - Diet: Make healthy diabetic food choices - Life style / activity / exercise: Discussed.  2. Blood pressure  -  BP Readings from Last 1 Encounters:  04/03/24 124/80    - Control is in target.  - No change in current plans.  3. Lipid status / Hyperlipidemia - Last  Lab Results  Component Value Date   LDLCALC 55 02/03/2024   - Continue atorvastatin  20 mg daily.  On Repatha  as well.  Managed by cardiology.    # Hypercalcemia / mild primary hyperparathyroidism -Patient has persistently high calcium  at least from 2018.  Being monitored conservatively. -She has complaints of muscle ache, worsening GERD and fatigue.  No history of kidney stone, fragility fracture or osteoporosis.  Last DEXA scan was in 2018. - Patient labs with corrected serum calcium  of 10.3 in hide normal range.  Will continue to monitor.  No plan for additional parathyroid  imaging test and parathyroid  surgery.  Patient prefers and agreed with conservative monitoring.  # Vitamin D  deficiency -Recent vitamin D  level acceptable 34 normalized.  Continue current vitamin D  supplement 1000 international unit daily.   [name] [name] was seen today for follow-up.  Diagnoses and all orders for this visit:  Uncontrolled type 2 diabetes mellitus with hyperglycemia, without long-term current use of insulin   (HCC)  Hypercalcemia  Hyperparathyroidism (HCC)  Vitamin D  deficiency    DISPOSITION Follow up in clinic in 3 months suggested.  Labs on the same day of the visit.   All questions answered and patient verbalized understanding of the plan.  [place] Charnay Nazario, MD [facility] Endocrinology [facility] [address] [place], [address] [place], [place] [address] Phone # [phone]  At least part of this note was generated using voice recognition software. Inadvertent word errors may have occurred, which were not recognized during the proofreading process.

## 2024-05-09 ENCOUNTER — Ambulatory Visit: Attending: Cardiology | Admitting: Nurse Practitioner

## 2024-05-09 ENCOUNTER — Encounter: Payer: Self-pay | Admitting: Nurse Practitioner

## 2024-05-09 VITALS — BP 122/80 | HR 71 | Ht 60.0 in | Wt 163.0 lb

## 2024-05-09 DIAGNOSIS — I2510 Atherosclerotic heart disease of native coronary artery without angina pectoris: Secondary | ICD-10-CM | POA: Diagnosis not present

## 2024-05-09 DIAGNOSIS — Z951 Presence of aortocoronary bypass graft: Secondary | ICD-10-CM | POA: Diagnosis not present

## 2024-05-09 DIAGNOSIS — E785 Hyperlipidemia, unspecified: Secondary | ICD-10-CM

## 2024-05-09 DIAGNOSIS — E119 Type 2 diabetes mellitus without complications: Secondary | ICD-10-CM

## 2024-05-09 DIAGNOSIS — I10 Essential (primary) hypertension: Secondary | ICD-10-CM

## 2024-05-09 NOTE — Progress Notes (Signed)
 Office Visit    Patient Name: [name] [name] Date of Encounter: 05/09/2024  Primary Care Provider:  Rexanne Ingle, MD Primary Cardiologist:  Dorn Lesches, MD  Chief Complaint    58 year old female with a history of CAD s/p CABG x 4 (RIMA-PDA, SVG-D2, SVG-OM1 and OM2) in 2018, hypertension, hyperlipidemia, type 2 diabetes, and obesity who presents for follow-up related to CAD and hypertension.  Past Medical History    Past Medical History:  Diagnosis Date   Anxiety    Constipation    Coronary artery disease    Diabetes mellitus without complication (HCC)    Heart disease    History of heart attack    Hyperlipidemia    Hypertension    Obesity    SOBOE (shortness of breath on exertion)    Swelling of both lower extremities    Trouble in sleeping    Vitamin D  deficiency    Past Surgical History:  Procedure Laterality Date   CARDIAC CATHETERIZATION     CESAREAN SECTION  12/13/1998   CORONARY ARTERY BYPASS GRAFT N/A 07/30/2017   Procedure: CORONARY ARTERY BYPASS GRAFTING (CABG)  x four, using right internal mammary artery, right greater saphenous vein, left radial artery;  Surgeon: Kerrin Elspeth BROCKS, MD;  Location: [facility] OR;  Service: Open Heart Surgery;  Laterality: N/A;   INTRAOPERATIVE TRANSESOPHAGEAL ECHOCARDIOGRAM N/A 07/30/2017   Procedure: INTRAOPERATIVE TRANSESOPHAGEAL ECHOCARDIOGRAM;  Surgeon: Kerrin Elspeth BROCKS, MD;  Location: [facility] OR;  Service: Open Heart Surgery;  Laterality: N/A;   LEFT HEART CATH AND CORONARY ANGIOGRAPHY N/A 07/29/2017   Procedure: LEFT HEART CATH AND CORONARY ANGIOGRAPHY;  Surgeon: [place], Peter M, MD;  Location: [facility] INVASIVE CV LAB;  Service: Cardiovascular;  Laterality: N/A;   LEFT HEART CATH AND CORS/GRAFTS ANGIOGRAPHY N/A 07/25/2018   Procedure: LEFT HEART CATH AND CORS/GRAFTS ANGIOGRAPHY;  Surgeon: Lesches Dorn JINNY, MD;  Location: [facility] INVASIVE CV LAB;  Service: Cardiovascular;  Laterality: N/A;   MYOMECTOMY  09/1994   PARTIAL  HYSTERECTOMY  2005   RADIAL ARTERY HARVEST Left 07/30/2017   Procedure: RADIAL ARTERY HARVEST;  Surgeon: Kerrin Elspeth BROCKS, MD;  Location: [facility] OR;  Service: Open Heart Surgery;  Laterality: Left;    Allergies  Allergies  Allergen Reactions   Penicillins Hives    Has patient had a PCN reaction causing immediate rash, facial/tongue/throat swelling, SOB or lightheadedness with hypotension: unkn Has patient had a PCN reaction causing severe rash involving mucus membranes or skin necrosis: unkn Has patient had a PCN reaction that required hospitalization: no Has patient had a PCN reaction occurring within the last 10 years: no If all of the above answers are NO, then may proceed with Cephalosporin use.      Labs/Other Studies Reviewed    The following studies were reviewed today:  Cardiac Studies & Procedures   ______________________________________________________________________________________________ CARDIAC CATHETERIZATION  CARDIAC CATHETERIZATION 07/25/2018  Conclusion Images from the original result were not included.   Dist RCA lesion is 100% stenosed.  Ost 1st Diag lesion is 100% stenosed.  Ost 1st Mrg lesion is 100% stenosed.  Ost 2nd Mrg to 2nd Mrg lesion is 100% stenosed.  The left ventricular systolic function is normal.  LV end diastolic pressure is normal.  The left ventricular ejection fraction is 55-65% by visual estimate.  TAL NEER is a 58 y.o. female   [id number] LOCATION:  FACILITY: [facility] PHYSICIAN: Dorn Lesches, M.D. 08/08/1966   DATE OF PROCEDURE:  07/25/2018  DATE OF DISCHARGE:     CARDIAC  CATHETERIZATION    History obtained from chart review.  58 year old moderately overweight African-American female history of hypertension, hyperlipidemia and diabetes who underwent CABG x4 October 2018 by Dr. Kerrin.  She had a RIMA to the PDA, vein to the diagonal and sequential left radial to OM 2 and 3.  Because of exertional  left arm pain she underwent Myoview  stress testing that showed no ischemia but transient ischemic dilatation.  Because of this, she presents now for outpatient diagnostic coronary angiography.  Impression Ms. Scheidegger has widely patent grafts including a RIMA to the PDA, vein to a diagonal and sequential left radial to OM 2 and 3.  LV function is normal.  I believe that her stress test was false positive and her symptoms noncardiac.  The sheath will be removed and pressure held.  Patient be discharged home later today as an outpatient and will follow-up with Herlene Canterbury in 7 to 10 days and me in 4 to 6 weeks.  She left the lab in stable condition.  Dorn Lesches. MD, [facility] 07/25/2018 11:50 AM       Findings Coronary Findings Diagnostic  Dominance: Right  Left Anterior Descending  First Diagonal Branch Ost 1st Diag lesion is 100% stenosed.  Left Circumflex  First Obtuse Marginal Branch Ost 1st Mrg lesion is 100% stenosed.  Second Obtuse Marginal Branch Ost 2nd Mrg to 2nd Mrg lesion is 100% stenosed.  Right Coronary Artery Dist RCA lesion is 100% stenosed.  RIMA Graft To RPDA  Sequential Graft To 1st Diag, 1st Mrg, 2nd Mrg  Intervention  No interventions have been documented.   CARDIAC CATHETERIZATION  CARDIAC CATHETERIZATION 07/29/2017  Conclusion  1st Diag lesion, 70 %stenosed.  2nd Diag lesion, 90 %stenosed.  Dist LAD lesion, 95 %stenosed.  1st Mrg lesion, 90 %stenosed.  Ost 2nd Mrg to 2nd Mrg lesion, 100 %stenosed.  Mid Cx to Dist Cx lesion, 80 %stenosed.  Mid RCA lesion, 75 %stenosed.  Dist RCA lesion, 95 %stenosed.  The left ventricular systolic function is normal.  LV end diastolic pressure is mildly elevated.  The left ventricular ejection fraction is 55-65% by visual estimate.  1. Severe 3 vessel obstructive CAD 2. Good LV function 3. Mildly elevated LVEDP  Plan: I reviewed angiograms with Dr. Lesches. She has severe multivessel CAD with  high Syntax score. There is sparing of the proximal to mid LAD. The culprit appears to the the second OM. Overall LV function is good. The question is whether she would be best managed with an aggressive PCI approach versus CABG. The first and second OMs, distal LCx, mid and distal RCA could be treated percutaneously but would require extensive stenting. I think a complete discussion with a multidisciplinary approach is needed. Will consult CT surgery. Continue aggressive medical therapy until decision made concerning revascularization.  Findings Coronary Findings Diagnostic  Dominance: Right  Left Main Vessel was injected. Vessel is large. 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  Left Anterior Descending Vessel is moderate in size.  First Diagonal Branch Vessel is small in size.  Second Diagonal Branch Vessel is moderate in size.  Left Circumflex Vessel is moderate in size.  First Obtuse Marginal Branch The lesion is segmental.  Second Obtuse Marginal Branch Collaterals 2nd Mrg filled by collaterals from 1st Mrg.  Third Obtuse Marginal Branch Vessel is small in size.  Right Coronary Artery  The lesion is located at the bifurcation.  Right Posterior Descending Artery Collaterals RPDA filled by collaterals from 3rd Sept.  Intervention  No interventions have been documented.   STRESS TESTS  MYOCARDIAL PERFUSION IMAGING 07/20/2018  Interpretation Summary  Nuclear stress EF: 52%.  The left ventricular ejection fraction is mildly decreased (45-54%).  Blood pressure demonstrated a normal response to exercise.  There was no ST segment deviation noted during stress.  The perfusion study is normal.  The TID is elevated at 1.33 suggesting transient ischemic LV dilatation which could indicate multivessel CAD.  This is an intermediate risk study due to transient ischemic dilatation   ECHOCARDIOGRAM  ECHOCARDIOGRAM COMPLETE 07/29/2017  Narrative *[facility]* *[facility]* [address] [place], [place] [address] [phone]  ------------------------------------------------------------------- Transthoracic Echocardiography  Patient:    Laurita, Peron MR #:       [id number] Study Date: 07/29/2017 Gender:     F Age:        50 Height:     152.4 cm Weight:     84.8 kg BSA:        1.94 m^2 Pt. Status: Room:       2C12C  ORDERING     Elspeth Millers, MD ADMITTING    Jerel Balding, MD ATTENDING    Jerel Balding, MD PERFORMING   [facility], Inpatient SONOGRAPHER  Elinor Fresh, RDCS  cc:  ------------------------------------------------------------------- LV EF: 60% -   65%  ------------------------------------------------------------------- Indications:      MI - acute 410.91.  ------------------------------------------------------------------- History:   PMH:  NSTEMI.  Risk factors:  Hypertension. Diabetes mellitus. Dyslipidemia.  ------------------------------------------------------------------- Study Conclusions  - Left ventricle: The cavity size was normal. Wall thickness was normal. Systolic function was normal. The estimated ejection fraction was in the range of 60% to 65%. The study is not technically sufficient to allow evaluation of LV diastolic function. - Left atrium: The atrium was normal in size. - Inferior vena cava: The vessel was normal in size. The respirophasic diameter changes were in the normal range (>= 50%), consistent with normal central venous pressure.  Impressions:  - LVEF 60-65%, normal wall thickness, normal wall motion, indeterminate diastolic function, normal LA size, normal IVC.  ------------------------------------------------------------------- Study data:  No prior study was available for comparison.  Study status:  Routine.  Procedure:  The patient reported no pain pre or post test. Transthoracic echocardiography. Image quality was adequate.  Study  completion:  There were no complications. Transthoracic echocardiography.  M-mode, complete 2D, spectral Doppler, and color Doppler.  Birthdate:  Patient birthdate: 11-27-1965.  Age:  Patient is 58 yr old.  Sex:  Gender: female. BMI: 36.5 kg/m^2.  Blood pressure:     123/88  Patient status: Inpatient.  Study date:  Study date: 07/29/2017. Study time: 12:39 PM.  Location:  Bedside.  -------------------------------------------------------------------  ------------------------------------------------------------------- Left ventricle:  The cavity size was normal. Wall thickness was normal. Systolic function was normal. The estimated ejection fraction was in the range of 60% to 65%. Images were inadequate for LV wall motion assessment. The study is not technically sufficient to allow evaluation of LV diastolic function.  ------------------------------------------------------------------- Aortic valve:   Structurally normal valve.   Cusp separation was normal.  Doppler:  Transvalvular velocity was within the normal range. There was no stenosis. There was no regurgitation.  ------------------------------------------------------------------- Aorta:  Aortic root: The aortic root was normal in size. Ascending aorta: The ascending aorta was normal in size.  ------------------------------------------------------------------- Mitral valve:   Structurally normal valve.   Leaflet separation was normal.  Doppler:  Transvalvular velocity was within the normal range. There was no evidence for stenosis.  There was no regurgitation.    Peak gradient (D): 7 mm Hg.  ------------------------------------------------------------------- Left atrium:  The atrium was normal in size.  ------------------------------------------------------------------- Atrial septum:  Poorly visualized.  ------------------------------------------------------------------- Right ventricle:  The cavity size was normal. Wall  thickness was normal. Systolic function was normal.  ------------------------------------------------------------------- Pulmonic valve:   Poorly visualized.  ------------------------------------------------------------------- Tricuspid valve:   Doppler:  There was no significant regurgitation.  ------------------------------------------------------------------- Pulmonary artery:   Poorly visualized.  ------------------------------------------------------------------- Right atrium:  The atrium was normal in size.  ------------------------------------------------------------------- Pericardium:  There was no pericardial effusion.  ------------------------------------------------------------------- Systemic veins: Inferior vena cava: The vessel was normal in size. The respirophasic diameter changes were in the normal range (>= 50%), consistent with normal central venous pressure.  ------------------------------------------------------------------- Measurements  Left ventricle                           Value        Reference LV ID, ED, PLAX chordal        (L)       41.2  mm     43 - 52 LV ID, ES, PLAX chordal                  27.3  mm     23 - 38 LV fx shortening, PLAX chordal           34    %      >=29 LV PW thickness, ED                      10.7  mm     ---------- IVS/LV PW ratio, ED                      0.89         <=1.3 Stroke volume, 2D                        61    ml     ---------- Stroke volume/bsa, 2D                    31    ml/m^2 ---------- LV e&', lateral                           7.94  cm/s   ---------- LV E/e&', lateral                         16.75        ---------- LV e&', medial                            3.92  cm/s   ---------- LV E/e&', medial                          33.93        ---------- LV e&', average                           5.93  cm/s   ---------- LV E/e&', average                         22.43        ----------  Ventricular septum                        Value        Reference IVS thickness, ED                        9.52  mm     ----------  LVOT                                     Value        Reference LVOT ID, S                               20    mm     ---------- LVOT area                                3.14  cm^2   ---------- LVOT peak velocity, S                    101   cm/s   ---------- LVOT mean velocity, S                    77.1  cm/s   ---------- LVOT VTI, S                              19.4  cm     ----------  Aorta                                    Value        Reference Aortic root ID, ED                       30    mm     ----------  Left atrium                              Value        Reference LA ID, A-P, ES                           32    mm     ---------- LA ID/bsa, A-P                           1.65  cm/m^2 <=2.2 LA volume, ES, 1-p A4C                   20.2  ml     ---------- LA volume/bsa, ES, 1-p A4C               10.4  ml/m^2 ----------  Mitral valve                             Value        Reference Mitral E-wave peak velocity  133   cm/s   ---------- Mitral deceleration time                 177   ms     150 - 230 Mitral peak gradient, D                  7     mm Hg  ----------  Right atrium                             Value        Reference RA ID, S-I, ES, A4C                      41.8  mm     34 - 49 RA area, ES, A4C                         9.37  cm^2   8.3 - 19.5 RA volume, ES, A/L                       16.9  ml     ---------- RA volume/bsa, ES, A/L                   8.7   ml/m^2 ----------  Right ventricle                          Value        Reference TAPSE                                    23.6  mm     ---------- RV s&', lateral, S                        13.4  cm/s   ----------  Legend: (L)  and  (H)  mark values outside specified reference range.  ------------------------------------------------------------------- Prepared and Electronically Authenticated by  Vinie Maxcy MD 2018-10-11T14:02:23   TEE  ECHO TEE 07/30/2017  Interpretation Summary  Left ventricle: Normal cavity size, wall thickness, left ventricular diastolic function and left atrial pressure. LV systolic function is low normal with an EF of 50-55%. No thrombus present. No mass present.  Right ventricle: Normal cavity size, wall thickness and ejection fraction.  Tricuspid valve: Trace regurgitation.        ______________________________________________________________________________________________     Recent Labs: 08/27/2023: Magnesium  1.8 02/03/2024: ALT 15 03/30/2024: BUN 19; Creat 0.84; Potassium 4.1; Sodium 137  Recent Lipid Panel    Component Value Date/Time   CHOL 120 02/03/2024 0935   TRIG 121 02/03/2024 0935   HDL 43 02/03/2024 0935   CHOLHDL 2.8 02/03/2024 0935   CHOLHDL 6.6 07/28/2017 1945   VLDL 34 07/28/2017 1945   LDLCALC 55 02/03/2024 0935    History of Present Illness    58 year old female with the above past medical history including CAD s/p CABG x 4 (RIMA-PDA, SVG-D2, SVG-OM1 and OM2) in 2018, hypertension, hyperlipidemia, type 2 diabetes, and obesity.  She was last seen October 2019 in the setting of NSTEMI.  Cardiac catheterization revealed three-vessel CAD.  She ultimately underwent CABG x 4 with Dr. Kerrin as above.  Echocardiogram at the time was normal.  Myoview  in 2019  revealed transient ischemic dilation.  Follow-up outpatient cardiac catheterization in 07/2018 revealed widely patent grafts.  She was last seen in the office on 02/08/2024 and was stable from a cardiac standpoint.  She reported atypical chest heaviness, overall stable.  BP was borderline low.  Amlodipine  was decreased to 5 mg daily.  She had lost 10 pounds since starting Mounjaro .  She presents today for follow-up.  Since her last visit she has done well from a cardiac standpoint.  She notes occasional chest heaviness, unchanged from prior visits, she denies any significant  exertional symptoms, not similar to prior anginal equivalent.  BP has been stable.  She has been under significant stress with her job, she is looking forward to taking a vacation in September 2025 (she is going on a cruise to [place]).  Overall, she reports feeling well.  Home Medications    Current Outpatient Medications  Medication Sig Dispense Refill   amLODipine  (NORVASC ) 5 MG tablet Take 1 tablet (5 mg total) by mouth daily. 90 tablet 3   atorvastatin  (LIPITOR ) 20 MG tablet Take 1 tablet (20 mg total) by mouth daily. 90 tablet 3   Evolocumab  (REPATHA  SURECLICK) 140 MG/ML SOAJ INJECT 140MG  ( 1 PEN) INTO THE SKIN EVERY 14 DAYS 2 mL 11   Fluocinolone Acetonide Scalp 0.01 % OIL SMARTSIG:Topical 1-2 Times Daily PRN     levocetirizine (XYZAL) 5 MG tablet daily at 6 (six) AM.     metFORMIN  (GLUCOPHAGE -XR) 500 MG 24 hr tablet Take 1 tablet (500 mg total) by mouth 2 (two) times daily. 180 tablet 3   metoprolol  tartrate (LOPRESSOR ) 100 MG tablet Take 1 tablet (100 mg total) by mouth 2 (two) times daily. Please call office to schedule an appt for further refills. Thank you 180 tablet 3   tirzepatide  (MOUNJARO ) 15 MG/0.5ML Pen Inject 15 mg into the skin once a week. 6 mL 3   triamcinolone cream (KENALOG) 0.1 % Apply topically 2 (two) times daily as needed.     valsartan  (DIOVAN ) 160 MG tablet TAKE 1 TABLET BY MOUTH EVERYDAY AT BEDTIME 90 tablet 3   No current facility-administered medications for this visit.     Review of Systems    She denies chest pain, palpitations, dyspnea, pnd, orthopnea, n, v, dizziness, syncope, edema, weight gain, or early satiety. All other systems reviewed and are otherwise negative except as noted above.   Physical Exam    VS:  BP 122/80 (BP Location: Right Arm)   Pulse 71   Ht 5' (1.524 m)   Wt 163 lb (73.9 kg)   SpO2 96%   BMI 31.83 kg/m   GEN: Well nourished, well developed, in no acute distress. HEENT: normal. Neck: Supple, no JVD, carotid bruits, or  masses. Cardiac: RRR, no murmurs, rubs, or gallops. No clubbing, cyanosis, edema.  Radials/DP/PT 2+ and equal bilaterally.  Respiratory:  Respirations regular and unlabored, clear to auscultation bilaterally. GI: Soft, nontender, nondistended, BS + x 4. MS: no deformity or atrophy. Skin: warm and dry, no rash. Neuro:  Strength and sensation are intact. Psych: Normal affect.  Accessory Clinical Findings    ECG personally reviewed by me today -    - no EKG in office today.   Lab Results  Component Value Date   WBC 9.8 11/19/2020   HGB 13.1 11/19/2020   HCT 39.3 11/19/2020   MCV 79 11/19/2020   PLT 329 11/19/2020   Lab Results  Component Value Date   CREATININE 0.84 03/30/2024  BUN 19 03/30/2024   NA 137 03/30/2024   K 4.1 03/30/2024   CL 103 03/30/2024   CO2 25 03/30/2024   Lab Results  Component Value Date   ALT 15 02/03/2024   AST 19 02/03/2024   ALKPHOS 73 02/03/2024   BILITOT 0.4 02/03/2024   Lab Results  Component Value Date   CHOL 120 02/03/2024   HDL 43 02/03/2024   LDLCALC 55 02/03/2024   TRIG 121 02/03/2024   CHOLHDL 2.8 02/03/2024    Lab Results  Component Value Date   HGBA1C 7.0 (H) 03/30/2024    Assessment & Plan    1. CAD: S/p CABG x 4 (RIMA-PDA, SVG-D2, SVG-OM1 and OM2) in 2018.  She reports occasional chest heaviness, overall atypical, not similar to prior anginal equivalent, unchanged from prior visits. No indication for ischemic evaluation.  Encourage increase activity as tolerated.  Continue metoprolol , valsartan , amlodipine , Repatha .  2. Hypertension: BP well controlled. Continue current antihypertensive regimen.   3. Hyperlipidemia: LDL was 55 in 01/2024.  Continue Lipitor , Repatha .  4. Type 2 diabetes/Obesity: A1c was 7.0 in 03/2024.  On Mounjaro .  Monitored and managed per PCP.  5. Disposition: Follow-up per recall with Dr. Court in 01/2025.      Damien JAYSON Braver, NP 05/09/2024, 11:07 AM

## 2024-05-09 NOTE — Patient Instructions (Addendum)
 Follow-Up: At [facility], you and your health needs are our priority.  As part of our continuing mission to provide you with exceptional heart care, our providers are all part of one team.  This team includes your primary Cardiologist (physician) and Advanced Practice Providers or APPs (Physician Assistants and Nurse Practitioners) who all work together to provide you with the care you need, when you need it.  Your next appointment:   Plan to schedule 1 year follow up appointment in Darus Hershman 2026  Provider:   Dorn Lesches, MD

## 2024-07-17 ENCOUNTER — Encounter: Payer: Self-pay | Admitting: Endocrinology

## 2024-07-17 ENCOUNTER — Ambulatory Visit: Payer: Self-pay | Admitting: Endocrinology

## 2024-07-17 ENCOUNTER — Ambulatory Visit: Admitting: Endocrinology

## 2024-07-17 VITALS — BP 132/80 | HR 73 | Resp 20 | Ht 60.0 in | Wt 165.4 lb

## 2024-07-17 DIAGNOSIS — Z7985 Long-term (current) use of injectable non-insulin antidiabetic drugs: Secondary | ICD-10-CM

## 2024-07-17 DIAGNOSIS — E213 Hyperparathyroidism, unspecified: Secondary | ICD-10-CM | POA: Diagnosis not present

## 2024-07-17 DIAGNOSIS — E1169 Type 2 diabetes mellitus with other specified complication: Secondary | ICD-10-CM | POA: Diagnosis not present

## 2024-07-17 DIAGNOSIS — E559 Vitamin D deficiency, unspecified: Secondary | ICD-10-CM | POA: Diagnosis not present

## 2024-07-17 DIAGNOSIS — Z7984 Long term (current) use of oral hypoglycemic drugs: Secondary | ICD-10-CM

## 2024-07-17 LAB — POCT GLYCOSYLATED HEMOGLOBIN (HGB A1C): Hemoglobin A1C: 6.4 % — AB (ref 4.0–5.6)

## 2024-07-17 NOTE — Progress Notes (Signed)
 Outpatient Endocrinology Note [place] Revel Stellmach, MD  07/17/24  Patient's Name: [name] [name]    DOB: 1966/10/11    MRN: [id number]                                                    REASON OF VISIT: Follow up of type 2 diabetes mellitus /hypercalcemia  PCP: Rexanne Ingle, MD  HISTORY OF PRESENT ILLNESS:   [name] [name] is a 58 y.o. old female with past medical history listed below, is here for follow up for type 2 diabetes mellitus and mild hypercalcemia / hyperparathyroidism.   Pertinent Diabetes History: Patient was diagnosed with type 2 diabetes mellitus in 2017.  Chronic Diabetes Complications : Retinopathy: no. Last ophthalmology exam was done on annually 04/2023, following with ophthalmology regularly.  Nephropathy: no, on ACE/ARB / valsartan  Peripheral neuropathy: no Coronary artery disease: CAD s/p CABG 2018. Stroke: no  Relevant comorbidities and cardiovascular risk factors: Obesity: yes Body mass index is 32.3 kg/m.  Hypertension: Yes  Hyperlipidemia : Yes, on statin / repatha   Current / Home Diabetic regimen includes: Mounjaro  15 mg weekly.  Metformin  ER 500 mg two times a day.  Sometimes 3 times a day.  Prior diabetic medications: Metoformin diarrhea on higher dose.   Glycemic data:   She has glucose log.  She uses [employer] glucometer.  Hypoglycemia: Patient has denies hypoglycemic episodes. Patient has hypoglycemia awareness.  Factors modifying glucose control: 1.  Diabetic diet assessment: 3 meals a day.  2.  Staying active or exercising:   3.  Medication compliance: compliant all of the time.  # Hypercalcemia / hyperparathyroidism Review of records show that she had a high calcium  of 11.2 done in October 2017. The patient thinks that she has had higher calcium  before also but no records are available. Apparently at some point she was taking HCTZ and this was stopped with some improvement in calcium . PTH level from unknown lab 29 done in 10/17 and  subsequently 32 in 8/19, normal.   - Her serum calcium  has been persistently high and ranging between 10.5 -11.4 since 12/18.  Being monitored conservatively.  No history of fragility fracture.  No kidney stone. No osteoporosis on previous bone density from 09/2017.  # VITAMIN D  deficiency:  - She  was taking vitamin D3, 4000 units.  Lately taking 1000 international unit daily.  In November 2024 : Hypercalcemia workup showed corrected serum calcium  is 10.6, serum calcium  10.4 with albumin  4.3.  Normal phosphorus.  Stable renal function EGFR 79.  Magnesium  normal.  Ionized calcium  mildly elevated 5.8.  Vitamin D  acceptable 28.  Parathyroid  hormone is elevated at 91 with upper normal limit of 65.  24-hour urine calcium  normal 214.  Overall consistent with mild primary hyperparathyroidism.  Other than symptoms there is no absolute indication for parathyroid  surgery.   Interval history  Hemoglobin A1c improved to 6.4% today.  Diabetes has been as reviewed and noted above.  Overall feeling good.  She has been taking vitamin D  supplement.  Denies numbness and ting of the feet.  No vision problem.  No other complaints today.  REVIEW OF SYSTEMS As per history of present illness.   PAST MEDICAL HISTORY: Past Medical History:  Diagnosis Date   Anxiety    Constipation    Coronary artery disease    Diabetes mellitus  without complication (HCC)    Heart disease    History of heart attack    Hyperlipidemia    Hypertension    Obesity    SOBOE (shortness of breath on exertion)    Swelling of both lower extremities    Trouble in sleeping    Vitamin D  deficiency     PAST SURGICAL HISTORY: Past Surgical History:  Procedure Laterality Date   CARDIAC CATHETERIZATION     CESAREAN SECTION  12/13/1998   CORONARY ARTERY BYPASS GRAFT N/A 07/30/2017   Procedure: CORONARY ARTERY BYPASS GRAFTING (CABG)  x four, using right internal mammary artery, right greater saphenous vein, left radial artery;  Surgeon:  Kerrin Elspeth BROCKS, MD;  Location: [facility] OR;  Service: Open Heart Surgery;  Laterality: N/A;   INTRAOPERATIVE TRANSESOPHAGEAL ECHOCARDIOGRAM N/A 07/30/2017   Procedure: INTRAOPERATIVE TRANSESOPHAGEAL ECHOCARDIOGRAM;  Surgeon: Kerrin Elspeth BROCKS, MD;  Location: [facility] OR;  Service: Open Heart Surgery;  Laterality: N/A;   LEFT HEART CATH AND CORONARY ANGIOGRAPHY N/A 07/29/2017   Procedure: LEFT HEART CATH AND CORONARY ANGIOGRAPHY;  Surgeon: [place], Peter M, MD;  Location: [facility] INVASIVE CV LAB;  Service: Cardiovascular;  Laterality: N/A;   LEFT HEART CATH AND CORS/GRAFTS ANGIOGRAPHY N/A 07/25/2018   Procedure: LEFT HEART CATH AND CORS/GRAFTS ANGIOGRAPHY;  Surgeon: Court Dorn PARAS, MD;  Location: [facility] INVASIVE CV LAB;  Service: Cardiovascular;  Laterality: N/A;   MYOMECTOMY  09/1994   PARTIAL HYSTERECTOMY  2005   RADIAL ARTERY HARVEST Left 07/30/2017   Procedure: RADIAL ARTERY HARVEST;  Surgeon: Kerrin Elspeth BROCKS, MD;  Location: [facility] OR;  Service: Open Heart Surgery;  Laterality: Left;    ALLERGIES: Allergies  Allergen Reactions   Penicillins Hives    Has patient had a PCN reaction causing immediate rash, facial/tongue/throat swelling, SOB or lightheadedness with hypotension: unkn Has patient had a PCN reaction causing severe rash involving mucus membranes or skin necrosis: unkn Has patient had a PCN reaction that required hospitalization: no Has patient had a PCN reaction occurring within the last 10 years: no If all of the above answers are NO, then may proceed with Cephalosporin use.     FAMILY HISTORY:  Family History  Problem Relation Age of Onset   Hypertension Other    Hypertension Mother    Hyperlipidemia Mother    Obesity Mother    Diabetes Father    Hypertension Father    Hyperlipidemia Father     SOCIAL HISTORY: Social History   Socioeconomic History   Marital status: Married    Spouse name: Jeydi Klingel   Number of children: 1   Years of education: Not on file    Highest education level: Not on file  Occupational History   Occupation: Health visitor  Tobacco Use   Smoking status: Never   Smokeless tobacco: Never  Vaping Use   Vaping status: Never Used  Substance and Sexual Activity   Alcohol  use: Yes   Drug use: No   Sexual activity: Not on file  Other Topics Concern   Not on file  Social History Narrative   Not on file   Social Drivers of Health   Financial Resource Strain: Not on file  Food Insecurity: Not on file  Transportation Needs: Not on file  Physical Activity: Insufficiently Active (09/02/2017)   Exercise Vital Sign    Days of Exercise per Week: 4 days    Minutes of Exercise per Session: 30 min  Stress: Stress Concern Present (09/02/2017)   [employer] of Occupational Health -  Occupational Stress Questionnaire    Feeling of Stress : Rather much  Social Connections: Not on file    MEDICATIONS:  Current Outpatient Medications  Medication Sig Dispense Refill   amLODipine  (NORVASC ) 5 MG tablet Take 1 tablet (5 mg total) by mouth daily. 90 tablet 3   atorvastatin  (LIPITOR ) 20 MG tablet Take 1 tablet (20 mg total) by mouth daily. 90 tablet 3   Evolocumab  (REPATHA  SURECLICK) 140 MG/ML SOAJ INJECT 140MG  ( 1 PEN) INTO THE SKIN EVERY 14 DAYS 2 mL 11   Fluocinolone Acetonide Scalp 0.01 % OIL SMARTSIG:Topical 1-2 Times Daily PRN     levocetirizine (XYZAL) 5 MG tablet daily at 6 (six) AM.     metFORMIN  (GLUCOPHAGE -XR) 500 MG 24 hr tablet Take 1 tablet (500 mg total) by mouth 2 (two) times daily. 180 tablet 3   metoprolol  tartrate (LOPRESSOR ) 100 MG tablet Take 1 tablet (100 mg total) by mouth 2 (two) times daily. Please call office to schedule an appt for further refills. Thank you 180 tablet 3   tirzepatide  (MOUNJARO ) 15 MG/0.5ML Pen Inject 15 mg into the skin once a week. 6 mL 3   triamcinolone cream (KENALOG) 0.1 % Apply topically 2 (two) times daily as needed.     valsartan  (DIOVAN ) 160 MG tablet TAKE 1 TABLET BY  MOUTH EVERYDAY AT BEDTIME 90 tablet 3   No current facility-administered medications for this visit.    PHYSICAL EXAM: Vitals:   07/17/24 1533  BP: 132/80  Pulse: 73  Resp: 20  SpO2: 99%  Weight: 165 lb 6.4 oz (75 kg)  Height: 5' (1.524 m)    Body mass index is 32.3 kg/m.  Wt Readings from Last 3 Encounters:  07/17/24 165 lb 6.4 oz (75 kg)  05/09/24 163 lb (73.9 kg)  04/03/24 163 lb 8 oz (74.2 kg)    General: Well developed, well nourished female in no apparent distress.  HEENT: AT/, no external lesions.  Eyes: Conjunctiva clear and no icterus. Neck: Neck supple  Lungs: Respirations not labored Neurologic: Alert, oriented, normal speech Extremities / Skin: Dry.  Psychiatric: Does not appear depressed or anxious  Diabetic Foot Exam - Simple   Simple Foot Form Diabetic Foot exam was performed with the following findings: Yes 07/17/2024  3:50 PM  Visual Inspection No deformities, no ulcerations, no other skin breakdown bilaterally: Yes Sensation Testing Intact to touch and monofilament testing bilaterally: Yes Pulse Check Posterior Tibialis and Dorsalis pulse intact bilaterally: Yes Comments    LABS Reviewed Lab Results  Component Value Date   HGBA1C 6.4 (A) 07/17/2024   HGBA1C 7.0 (H) 03/30/2024   HGBA1C 6.5 (A) 12/03/2023   No results found for: FRUCTOSAMINE Lab Results  Component Value Date   CHOL 120 02/03/2024   HDL 43 02/03/2024   LDLCALC 55 02/03/2024   TRIG 121 02/03/2024   CHOLHDL 2.8 02/03/2024   Lab Results  Component Value Date   MICRALBCREAT 7 03/30/2024   Lab Results  Component Value Date   CREATININE 0.84 03/30/2024   Lab Results  Component Value Date   GFR 79.15 08/27/2023    ASSESSMENT / PLAN  1. Type 2 diabetes mellitus with other specified complication, without long-term current use of insulin  (HCC)   2. Hypercalcemia   3. Vitamin D  deficiency   4. Hyperparathyroidism     Diabetes Mellitus type 2, complicated by  CAD - Diabetic status / severity: Fair control  Lab Results  Component Value Date   HGBA1C 6.4 (A)  07/17/2024    - Hemoglobin A1c goal : <6.5%  Discussed about limiting carbohydrate and increase physical activity with the schedule exercise.  No change in medication dose at this time.  - Medications: No change.  I) continue Mounjaro  15 mg weekly. II) continue metformin  extended release 500 mg with meals 2 times a day.  Okay to take 3 times a day.  She has been sometimes taking 3 times a day and she has enough medication.  - Home glucose testing: Advised to check blood sugar in the morning fasting and at bedtime.   - Discussed/ Gave Hypoglycemia treatment plan.  # Consult : not required at this time.   # Annual urine for microalbuminuria/ creatinine ratio, no microalbuminuria currently, continue ACE/ARB valsartan .  Last  Lab Results  Component Value Date   MICRALBCREAT 7 03/30/2024    # Foot check nightly.  # Annual dilated diabetic eye exams.   - Diet: Make healthy diabetic food choices - Life style / activity / exercise: Discussed.  2. Blood pressure  -  BP Readings from Last 1 Encounters:  07/17/24 132/80    - Control is in target.  - No change in current plans.  3. Lipid status / Hyperlipidemia - Last  Lab Results  Component Value Date   LDLCALC 55 02/03/2024   - Continue atorvastatin  20 mg daily.  On Repatha  as well.  Managed by cardiology.    # Hypercalcemia / mild primary hyperparathyroidism -Patient has persistently high calcium  at least from 2018.  Being monitored conservatively. -She has complaints of muscle ache, worsening GERD and fatigue.  No history of kidney stone, fragility fracture or osteoporosis.  Last DEXA scan was in 2018. - Patient labs with corrected serum calcium  of 10.3 in hide normal range.  Will continue to monitor.  No plan for additional parathyroid  imaging test and parathyroid  surgery.  Patient prefers and agreed with conservative  monitoring.  # Vitamin D  deficiency -Vitamin D  level acceptable 34 normalized.  Continue current vitamin D  supplement 1000 international unit daily.   Diagnoses and all orders for this visit:  Type 2 diabetes mellitus with other specified complication, without long-term current use of insulin  (HCC) -     POCT glycosylated hemoglobin (Hb A1C)  Hypercalcemia  Vitamin D  deficiency  Hyperparathyroidism    DISPOSITION Follow up in clinic in 4 months suggested.     All questions answered and patient verbalized understanding of the plan.  [place] Tyresha Fede, MD [facility] Endocrinology [facility] [address] [place], [address] [place], [place] [address] Phone # [phone]  At least part of this note was generated using voice recognition software. Inadvertent word errors may have occurred, which were not recognized during the proofreading process.

## 2024-08-28 LAB — BASIC METABOLIC PANEL WITH GFR
BUN: 19 mg/dL (ref 7–25)
CO2: 25 mmol/L (ref 20–32)
Calcium: 10.8 mg/dL — ABNORMAL HIGH (ref 8.6–10.4)
Chloride: 103 mmol/L (ref 98–110)
Creat: 0.84 mg/dL (ref 0.50–1.03)
Glucose, Bld: 99 mg/dL (ref 65–139)
Potassium: 4.1 mmol/L (ref 3.5–5.3)
Sodium: 137 mmol/L (ref 135–146)
eGFR: 80 mL/min/1.73m2 (ref >=60)

## 2024-08-28 LAB — PARATHYROID HORMONE, INTACT (NO CA): PTH: 126 pg/mL — ABNORMAL HIGH (ref 16–77)

## 2024-08-28 LAB — VITAMIN D 25 HYDROXY (VIT D DEFICIENCY, FRACTURES): Vit D, 25-Hydroxy: 34 ng/mL (ref 30–100)

## 2024-08-28 LAB — MICROALBUMIN / CREATININE URINE RATIO
Creatinine, Urine: 213 mg/dL (ref 20–275)
Microalb Creat Ratio: 7 mg/g{creat} (ref <30)
Microalb, Ur: 1.4 mg/dL

## 2024-08-28 LAB — ALBUMIN: Albumin: 4.6 g/dL (ref 3.6–5.1)

## 2024-08-28 LAB — HEMOGLOBIN A1C
Hgb A1c MFr Bld: 7 % — ABNORMAL HIGH (ref <5.7)
Mean Plasma Glucose: 154 mg/dL
eAG (mmol/L): 8.5 mmol/L

## 2024-09-19 ENCOUNTER — Other Ambulatory Visit: Payer: Self-pay | Admitting: Internal Medicine

## 2024-09-19 DIAGNOSIS — Z1231 Encounter for screening mammogram for malignant neoplasm of breast: Secondary | ICD-10-CM

## 2024-09-26 ENCOUNTER — Inpatient Hospital Stay: Admission: RE | Admit: 2024-09-26 | Discharge: 2024-09-26 | Attending: Internal Medicine | Admitting: Internal Medicine

## 2024-09-26 DIAGNOSIS — Z1231 Encounter for screening mammogram for malignant neoplasm of breast: Secondary | ICD-10-CM

## 2024-11-16 ENCOUNTER — Telehealth: Admitting: Endocrinology

## 2024-11-16 ENCOUNTER — Encounter: Payer: Self-pay | Admitting: Endocrinology

## 2024-11-16 VITALS — Ht 60.0 in | Wt 163.0 lb

## 2024-11-16 DIAGNOSIS — E559 Vitamin D deficiency, unspecified: Secondary | ICD-10-CM | POA: Diagnosis not present

## 2024-11-16 DIAGNOSIS — E213 Hyperparathyroidism, unspecified: Secondary | ICD-10-CM

## 2024-11-16 DIAGNOSIS — E1169 Type 2 diabetes mellitus with other specified complication: Secondary | ICD-10-CM | POA: Diagnosis not present

## 2024-11-16 NOTE — Progress Notes (Signed)
 "  Outpatient Endocrinology Note [name] Bergren, MD  11/16/24  Patient's Name: [name] [name]    DOB: [birth year]    MRN: [id number]                                                    REASON OF VISIT: Follow up of type 2 diabetes mellitus /hypercalcemia  I connected with  [name] on 11/16/24 by a video enabled telemedicine application and verified that I am speaking with the correct person using two identifiers.   I discussed the limitations of evaluation and management by telemedicine. The patient expressed understanding and agreed to proceed.   Provider location : Office Patient location : At [place] outside from home.  Reason for MyChart video visit due to weather.  PCP: Rexanne Ingle, MD  HISTORY OF PRESENT ILLNESS:   [name] [name] is a 59 y.o. old female with past medical history listed below, is here for follow up for type 2 diabetes mellitus and mild hypercalcemia / hyperparathyroidism.   Pertinent Diabetes History: Patient was diagnosed with type 2 diabetes mellitus in 2017.  Chronic Diabetes Complications : Retinopathy: no. Last ophthalmology exam was done on annually 04/2023, following with ophthalmology regularly.  Nephropathy: no, on ACE/ARB / valsartan  Peripheral neuropathy: no Coronary artery disease: CAD s/p CABG 2018. Stroke: no  Relevant comorbidities and cardiovascular risk factors: Obesity: yes Body mass index is 31.83 kg/m.  Hypertension: Yes  Hyperlipidemia : Yes, on statin / repatha   Current / Home Diabetic regimen includes: Mounjaro  15 mg weekly.  Metformin  ER 500 mg two times a day.  Sometimes 3 times a day.  Prior diabetic medications: Metoformin diarrhea on higher dose.   Glycemic data:   She has glucose log.  She uses [employer] glucometer.  Hypoglycemia: Patient has denies hypoglycemic episodes. Patient has hypoglycemia awareness.  Factors modifying glucose control: 1.  Diabetic diet assessment: 3 meals a day.  2.  Staying  active or exercising:   3.  Medication compliance: compliant all of the time.  # Hypercalcemia / hyperparathyroidism Review of records show that she had a high calcium  of 11.2 done in October 2017. The patient thinks that she has had higher calcium  before also but no records are available. Apparently at some point she was taking HCTZ and this was stopped with some improvement in calcium . PTH level from unknown lab 29 done in 10/17 and subsequently 32 in 8/19, normal.   - Her serum calcium  has been persistently high and ranging between 10.5 -11.4 since 12/18.  Being monitored conservatively.  No history of fragility fracture.  No kidney stone. No osteoporosis on previous bone density from 09/2017.  # VITAMIN D  deficiency:  - She  was taking vitamin D3, 4000 units.  Lately taking 1000 international unit daily.  In November 2024 : Hypercalcemia workup showed corrected serum calcium  is 10.6, serum calcium  10.4 with albumin  4.3.  Normal phosphorus.  Stable renal function EGFR 79.  Magnesium  normal.  Ionized calcium  mildly elevated 5.8.  Vitamin D  acceptable 28.  Parathyroid  hormone is elevated at 91 with upper normal limit of 65.  24-hour urine calcium  normal 214.  Overall consistent with mild primary hyperparathyroidism.  Other than symptoms there is no absolute indication for parathyroid  surgery.   Interval history  She has complaints of occasional sickness, abdominal fullness and gas.  She has been taking Mounjaro  and metformin  2-3 times a day.  She has reasonable control of diabetes.  Reports blood sugar has been acceptable at home no significant hyperglycemia.  No glucose data to review.  She has no numbness and tingling to feet.  No vision problem.  No other complaints today.  MyChart video visit today.  REVIEW OF SYSTEMS As per history of present illness.   PAST MEDICAL HISTORY: Past Medical History:  Diagnosis Date   Anxiety    Constipation    Coronary artery disease    Diabetes  mellitus without complication (HCC)    Heart disease    History of heart attack    Hyperlipidemia    Hypertension    Obesity    SOBOE (shortness of breath on exertion)    Swelling of both lower extremities    Trouble in sleeping    Vitamin D  deficiency     PAST SURGICAL HISTORY: Past Surgical History:  Procedure Laterality Date   CARDIAC CATHETERIZATION     CESAREAN SECTION  12/13/1998   CORONARY ARTERY BYPASS GRAFT N/A 07/30/2017   Procedure: CORONARY ARTERY BYPASS GRAFTING (CABG)  x four, using right internal mammary artery, right greater saphenous vein, left radial artery;  Surgeon: Kerrin Elspeth BROCKS, MD;  Location: [facility] OR;  Service: Open Heart Surgery;  Laterality: N/A;   INTRAOPERATIVE TRANSESOPHAGEAL ECHOCARDIOGRAM N/A 07/30/2017   Procedure: INTRAOPERATIVE TRANSESOPHAGEAL ECHOCARDIOGRAM;  Surgeon: Kerrin Elspeth BROCKS, MD;  Location: [facility] OR;  Service: Open Heart Surgery;  Laterality: N/A;   LEFT HEART CATH AND CORONARY ANGIOGRAPHY N/A 07/29/2017   Procedure: LEFT HEART CATH AND CORONARY ANGIOGRAPHY;  Surgeon: Jordan, Peter M, MD;  Location: [facility] INVASIVE CV LAB;  Service: Cardiovascular;  Laterality: N/A;   LEFT HEART CATH AND CORS/GRAFTS ANGIOGRAPHY N/A 07/25/2018   Procedure: LEFT HEART CATH AND CORS/GRAFTS ANGIOGRAPHY;  Surgeon: Court Dorn PARAS, MD;  Location: [facility] INVASIVE CV LAB;  Service: Cardiovascular;  Laterality: N/A;   MYOMECTOMY  09/1994   PARTIAL HYSTERECTOMY  2005   RADIAL ARTERY HARVEST Left 07/30/2017   Procedure: RADIAL ARTERY HARVEST;  Surgeon: Kerrin Elspeth BROCKS, MD;  Location: [facility] OR;  Service: Open Heart Surgery;  Laterality: Left;    ALLERGIES: Allergies  Allergen Reactions   Penicillins Hives    Has patient had a PCN reaction causing immediate rash, facial/tongue/throat swelling, SOB or lightheadedness with hypotension: unkn Has patient had a PCN reaction causing severe rash involving mucus membranes or skin necrosis: unkn Has patient had a PCN  reaction that required hospitalization: no Has patient had a PCN reaction occurring within the last 10 years: no If all of the above answers are NO, then may proceed with Cephalosporin use.     FAMILY HISTORY:  Family History  Problem Relation Age of Onset   Hypertension Other    Hypertension Mother    Hyperlipidemia Mother    Obesity Mother    Diabetes Father    Hypertension Father    Hyperlipidemia Father     SOCIAL HISTORY: Social History   Socioeconomic History   Marital status: Married    Spouse name: Penni Penado   Number of children: 1   Years of education: Not on file   Highest education level: Not on file  Occupational History   Occupation: Health Visitor  Tobacco Use   Smoking status: Never   Smokeless tobacco: Never  Vaping Use   Vaping status: Never Used  Substance and Sexual Activity   Alcohol  use: Yes  Drug use: No   Sexual activity: Not on file  Other Topics Concern   Not on file  Social History Narrative   Not on file   Social Drivers of Health   Tobacco Use: Low Risk (11/16/2024)   Patient History    Smoking Tobacco Use: Never    Smokeless Tobacco Use: Never    Passive Exposure: Not on file  Financial Resource Strain: Not on file  Food Insecurity: Not on file  Transportation Needs: Not on file  Physical Activity: Not on file  Stress: Not on file  Social Connections: Not on file  Depression (EYV7-0): Not on file  Alcohol  Screen: Not on file  Housing: Not on file  Utilities: Not on file  Health Literacy: Not on file    MEDICATIONS:  Current Outpatient Medications  Medication Sig Dispense Refill   amLODipine  (NORVASC ) 5 MG tablet Take 1 tablet (5 mg total) by mouth daily. 90 tablet 3   atorvastatin  (LIPITOR ) 20 MG tablet Take 1 tablet (20 mg total) by mouth daily. 90 tablet 3   Evolocumab  (REPATHA  SURECLICK) 140 MG/ML SOAJ INJECT 140MG  ( 1 PEN) INTO THE SKIN EVERY 14 DAYS 2 mL 11   Fluocinolone Acetonide Scalp 0.01 % OIL  SMARTSIG:Topical 1-2 Times Daily PRN     levocetirizine (XYZAL) 5 MG tablet daily at 6 (six) AM.     metFORMIN  (GLUCOPHAGE -XR) 500 MG 24 hr tablet Take 1 tablet (500 mg total) by mouth 2 (two) times daily. 180 tablet 3   metoprolol  tartrate (LOPRESSOR ) 100 MG tablet Take 1 tablet (100 mg total) by mouth 2 (two) times daily. Please call office to schedule an appt for further refills. Thank you 180 tablet 3   tirzepatide  (MOUNJARO ) 15 MG/0.5ML Pen Inject 15 mg into the skin once a week. 6 mL 3   triamcinolone cream (KENALOG) 0.1 % Apply topically 2 (two) times daily as needed.     valsartan  (DIOVAN ) 160 MG tablet TAKE 1 TABLET BY MOUTH EVERYDAY AT BEDTIME 90 tablet 3   No current facility-administered medications for this visit.    PHYSICAL EXAM: Vitals:   11/16/24 1548  Weight: 163 lb (73.9 kg)  Height: 5' (1.524 m)    Body mass index is 31.83 kg/m.  Wt Readings from Last 3 Encounters:  11/16/24 163 lb (73.9 kg)  07/17/24 165 lb 6.4 oz (75 kg)  05/09/24 163 lb (73.9 kg)    General: Well developed, well nourished female in no apparent distress.  HEENT: AT/[place], no external lesions.  Eyes: Conjunctiva clear and no icterus. Neurologic: Alert, oriented, normal speech Psychiatric: Does not appear depressed or anxious  Diabetic Foot Exam - Simple   No data filed    LABS Reviewed Lab Results  Component Value Date   HGBA1C 6.4 (A) 07/17/2024   HGBA1C 7.0 (H) 03/30/2024   HGBA1C 6.5 (A) 12/03/2023   No results found for: FRUCTOSAMINE Lab Results  Component Value Date   CHOL 120 02/03/2024   HDL 43 02/03/2024   LDLCALC 55 02/03/2024   TRIG 121 02/03/2024   CHOLHDL 2.8 02/03/2024   Lab Results  Component Value Date   MICRALBCREAT 7 03/30/2024   Lab Results  Component Value Date   CREATININE 0.84 03/30/2024   Lab Results  Component Value Date   GFR 79.15 08/27/2023    ASSESSMENT / PLAN  1. Type 2 diabetes mellitus with other specified complication, without  long-term current use of insulin  (HCC)   2. Vitamin D  deficiency  3. Hyperparathyroidism   4. Hypercalcemia    Diabetes Mellitus type 2, complicated by CAD - Diabetic status / severity: Fair control  Lab Results  Component Value Date   HGBA1C 6.4 (A) 07/17/2024    - Hemoglobin A1c goal : <6.5%  Due to complaints of sickness will gradually decrease dose of metformin .  Will keep the same dose of Mounjaro  for now.  -MEDICATIONS: See below I) continue Mounjaro  15 mg weekly. II) currently taking metformin  extended release 500 mg 2-3 times a day.  Gradual decrease if continue to have stomach issue stop completely.  - Home glucose testing: Advised to check blood sugar in the morning fasting and at bedtime.   - Discussed/ Gave Hypoglycemia treatment plan.  # Consult : not required at this time.   # Annual urine for microalbuminuria/ creatinine ratio, no microalbuminuria currently, continue ACE/ARB valsartan .  Last  Lab Results  Component Value Date   MICRALBCREAT 7 03/30/2024    # Foot check nightly.  # Annual dilated diabetic eye exams.   - Diet: Make healthy diabetic food choices - Life style / activity / exercise: Discussed.  2. Blood pressure  -  BP Readings from Last 1 Encounters:  07/17/24 132/80    - Control is in target.  - No change in current plans.  3. Lipid status / Hyperlipidemia - Last  Lab Results  Component Value Date   LDLCALC 55 02/03/2024   - Continue atorvastatin  20 mg daily.  On Repatha  as well.  Managed by cardiology.    # Hypercalcemia / mild primary hyperparathyroidism -Patient has persistently high calcium  at least from 2018.  Being monitored conservatively. -She has complaints of muscle ache, worsening GERD and fatigue.  No history of kidney stone, fragility fracture or osteoporosis.  Last DEXA scan was in 2018. - Patient labs with corrected serum calcium  of 10.3 in hide normal range.  Will continue to monitor.  No plan for additional  parathyroid  imaging test and parathyroid  surgery.  Patient prefers and agreed with conservative monitoring.  # Vitamin D  deficiency -Vitamin D  level acceptable 34 normalized in June 2025.  Continue current vitamin D  supplement 1000 international unit daily.   Henleigh was seen today for diabetes.  Diagnoses and all orders for this visit:  Type 2 diabetes mellitus with other specified complication, without long-term current use of insulin  (HCC) -     Hemoglobin A1c -     Basic metabolic panel with GFR  Vitamin D  deficiency -     VITAMIN D  25 Hydroxy (Vit-D Deficiency, Fractures) -     Parathyroid  hormone, intact (no Ca)  Hyperparathyroidism -     VITAMIN D  25 Hydroxy (Vit-D Deficiency, Fractures) -     Albumin   Hypercalcemia     DISPOSITION Follow up in clinic in 3 months suggested.  Labs prior to follow-up visit as ordered.   All questions answered and patient verbalized understanding of the plan.  Timika Muench, MD [facility] Endocrinology [facility] [address] [place], [address] [place], [place] [address] Phone # [phone]  At least part of this note was generated using voice recognition software. Inadvertent word errors may have occurred, which were not recognized during the proofreading process. "
